# Patient Record
Sex: Female | Born: 1965 | Race: White | Hispanic: No | Smoking: Never smoker
Health system: Southern US, Community
[De-identification: ages and names within clinical notes are randomized; demographics above are authoritative.]

## PROBLEM LIST (undated history)

## (undated) DIAGNOSIS — K760 Fatty (change of) liver, not elsewhere classified: Secondary | ICD-10-CM

## (undated) DIAGNOSIS — I10 Essential (primary) hypertension: Secondary | ICD-10-CM

## (undated) DIAGNOSIS — R7303 Prediabetes: Secondary | ICD-10-CM

## (undated) DIAGNOSIS — M1990 Unspecified osteoarthritis, unspecified site: Secondary | ICD-10-CM

## (undated) DIAGNOSIS — Z923 Personal history of irradiation: Secondary | ICD-10-CM

## (undated) DIAGNOSIS — H269 Unspecified cataract: Secondary | ICD-10-CM

## (undated) DIAGNOSIS — N6320 Unspecified lump in the left breast, unspecified quadrant: Secondary | ICD-10-CM

## (undated) DIAGNOSIS — N8502 Endometrial intraepithelial neoplasia [EIN]: Secondary | ICD-10-CM

## (undated) DIAGNOSIS — F419 Anxiety disorder, unspecified: Secondary | ICD-10-CM

## (undated) HISTORY — DX: Unspecified cataract: H26.9

## (undated) HISTORY — DX: Fatty (change of) liver, not elsewhere classified: K76.0

## (undated) HISTORY — PX: CHOLECYSTECTOMY: SHX55

## (undated) HISTORY — DX: Anxiety disorder, unspecified: F41.9

## (undated) HISTORY — DX: Essential (primary) hypertension: I10

## (undated) HISTORY — PX: ABDOMINAL HYSTERECTOMY: SHX81

## (undated) HISTORY — PX: BREAST SURGERY: SHX581

## (undated) HISTORY — PX: WISDOM TOOTH EXTRACTION: SHX21

## (undated) HISTORY — PX: APPENDECTOMY: SHX54

## (undated) HISTORY — PX: BREAST EXCISIONAL BIOPSY: SUR124

---

## 2014-09-13 ENCOUNTER — Emergency Department (HOSPITAL_COMMUNITY)
Admission: EM | Admit: 2014-09-13 | Discharge: 2014-09-13 | Disposition: A | Discharge status: Home / Self Care | Payer: PRIVATE HEALTH INSURANCE | Attending: Emergency Medicine | Admitting: Emergency Medicine

## 2014-09-13 ENCOUNTER — Emergency Department (HOSPITAL_COMMUNITY): Payer: PRIVATE HEALTH INSURANCE

## 2014-09-13 ENCOUNTER — Encounter (HOSPITAL_COMMUNITY): Payer: Self-pay | Admitting: Emergency Medicine

## 2014-09-13 DIAGNOSIS — R062 Wheezing: Secondary | ICD-10-CM

## 2014-09-13 DIAGNOSIS — R918 Other nonspecific abnormal finding of lung field: Secondary | ICD-10-CM | POA: Diagnosis not present

## 2014-09-13 DIAGNOSIS — R911 Solitary pulmonary nodule: Secondary | ICD-10-CM | POA: Insufficient documentation

## 2014-09-13 DIAGNOSIS — J050 Acute obstructive laryngitis [croup]: Secondary | ICD-10-CM | POA: Insufficient documentation

## 2014-09-13 DIAGNOSIS — R0602 Shortness of breath: Secondary | ICD-10-CM | POA: Diagnosis present

## 2014-09-13 HISTORY — DX: Morbid (severe) obesity due to excess calories: E66.01

## 2014-09-13 LAB — BASIC METABOLIC PANEL
Anion gap: 12 (ref 5–15)
BUN: 6 mg/dL (ref 6–23)
CALCIUM: 8.3 mg/dL — AB (ref 8.4–10.5)
CO2: 25 mmol/L (ref 19–32)
CREATININE: 0.85 mg/dL (ref 0.50–1.10)
Chloride: 100 mmol/L (ref 96–112)
GFR calc Af Amer: >90 mL/min (ref >90)
GFR calc non Af Amer: 81 mL/min — ABNORMAL LOW (ref >90)
Glucose, Bld: 126 mg/dL — ABNORMAL HIGH (ref 70–99)
Potassium: 3.9 mmol/L (ref 3.5–5.1)
Sodium: 137 mmol/L (ref 135–145)

## 2014-09-13 LAB — CBC WITH DIFFERENTIAL/PLATELET
BASOS ABS: 0 10*3/uL (ref 0.0–0.1)
Basophils Relative: 0 % (ref 0–1)
Eosinophils Absolute: 0.1 10*3/uL (ref 0.0–0.7)
Eosinophils Relative: 1 % (ref 0–5)
HCT: 40.4 % (ref 36.0–46.0)
Hemoglobin: 13.2 g/dL (ref 12.0–15.0)
LYMPHS ABS: 1.3 10*3/uL (ref 0.7–4.0)
Lymphocytes Relative: 13 % (ref 12–46)
MCH: 28 pg (ref 26.0–34.0)
MCHC: 32.7 g/dL (ref 30.0–36.0)
MCV: 85.8 fL (ref 78.0–100.0)
MONO ABS: 0.7 10*3/uL (ref 0.1–1.0)
Monocytes Relative: 7 % (ref 3–12)
Neutro Abs: 8.2 10*3/uL — ABNORMAL HIGH (ref 1.7–7.7)
Neutrophils Relative %: 79 % — ABNORMAL HIGH (ref 43–77)
Platelets: 236 10*3/uL (ref 150–400)
RBC: 4.71 MIL/uL (ref 3.87–5.11)
RDW: 14.3 % (ref 11.5–15.5)
WBC: 10.2 10*3/uL (ref 4.0–10.5)

## 2014-09-13 LAB — RAPID STREP SCREEN (MED CTR MEBANE ONLY): Streptococcus, Group A Screen (Direct): NEGATIVE

## 2014-09-13 MED ORDER — HYDROCODONE-ACETAMINOPHEN 7.5-325 MG/15ML PO SOLN
10.0000 mL | Freq: Once | ORAL | Status: AC
  Administered 2014-09-13: 10 mL via ORAL
  Filled 2014-09-13: qty 15

## 2014-09-13 MED ORDER — PREDNISONE 10 MG PO TABS: 20.0000 mg | ORAL_TABLET | Freq: Every day | ORAL | Status: DC

## 2014-09-13 MED ORDER — IPRATROPIUM BROMIDE 0.02 % IN SOLN
0.5000 mg | Freq: Once | RESPIRATORY_TRACT | Status: AC
  Administered 2014-09-13: 0.5 mg via RESPIRATORY_TRACT
  Filled 2014-09-13: qty 2.5

## 2014-09-13 MED ORDER — RACEPINEPHRINE HCL 2.25 % IN NEBU
0.5000 mL | INHALATION_SOLUTION | Freq: Once | RESPIRATORY_TRACT | Status: AC
  Administered 2014-09-13: 0.5 mL via RESPIRATORY_TRACT

## 2014-09-13 MED ORDER — HYDROCODONE-ACETAMINOPHEN 7.5-325 MG/15ML PO SOLN: 10.0000 mL | Freq: Four times a day (QID) | ORAL | Status: DC | PRN

## 2014-09-13 MED ORDER — ALBUTEROL SULFATE (2.5 MG/3ML) 0.083% IN NEBU
5.0000 mg | INHALATION_SOLUTION | Freq: Once | RESPIRATORY_TRACT | Status: AC
  Administered 2014-09-13: 5 mg via RESPIRATORY_TRACT
  Filled 2014-09-13: qty 6

## 2014-09-13 MED ORDER — LORAZEPAM 2 MG/ML IJ SOLN: 0.5000 mg | Freq: Once | INTRAMUSCULAR | Status: DC

## 2014-09-13 MED ORDER — SODIUM CHLORIDE 0.9 % IN NEBU
9.0000 mL | INHALATION_SOLUTION | Freq: Once | RESPIRATORY_TRACT | Status: AC
  Administered 2014-09-13: 9 mL via RESPIRATORY_TRACT
  Filled 2014-09-13: qty 9

## 2014-09-13 MED ORDER — ERYTHROMYCIN BASE 250 MG PO TBEC
500.0000 mg | DELAYED_RELEASE_TABLET | Freq: Once | ORAL | Status: AC
  Administered 2014-09-13: 500 mg via ORAL
  Filled 2014-09-13: qty 2

## 2014-09-13 MED ORDER — PREDNISONE 20 MG PO TABS
60.0000 mg | ORAL_TABLET | Freq: Once | ORAL | Status: AC
  Administered 2014-09-13: 60 mg via ORAL
  Filled 2014-09-13: qty 3

## 2014-09-13 MED ORDER — AZITHROMYCIN 250 MG PO TABS: 250.0000 mg | ORAL_TABLET | Freq: Every day | ORAL | Status: DC

## 2014-09-13 MED ORDER — RACEPINEPHRINE HCL 2.25 % IN NEBU
0.5000 mL | INHALATION_SOLUTION | Freq: Once | RESPIRATORY_TRACT | Status: DC
  Filled 2014-09-13: qty 0.5

## 2014-09-13 NOTE — ED Provider Notes (Signed)
Patient presented to the ER with cough, sore throat, shortness of breath. Symptoms ongoing for approximately 1 week.  Face to face Exam: HEENT - PERRLA Neck - noisy breathing with upper airway resonance, no stridor Lungs - CTAB, but significant upper airway resonance present Heart - RRR, no M/R/G Abd - S/NT/ND Neuro - alert, oriented x3  Plan: Patient presents with flulike symptoms and upper respiratory infection symptoms for one week. She does have auditory respirations are consistent with significant upper airway residence. She does, however, have a harsh barky cough as well. Soft tissue neck x-ray and examination do not support epiglottitis. Symptoms most likely viral but cannot rule out pertussis. Will need treatment with steroids for upper airway inflammation and empiric coverage for pertussis.  Orpah Greek, MD 09/13/14 [phone]

## 2014-09-13 NOTE — ED Provider Notes (Signed)
CSN: [id number]     Arrival date & time 09/13/14  1056 History   First MD Initiated Contact with Patient 09/13/14 1059     Chief Complaint  Patient presents with  . Shortness of Breath     (Consider location/radiation/quality/duration/timing/severity/associated sxs/prior Treatment) HPI    PCP: No PCP Per Patient Blood pressure 157/72, pulse 86, temperature 99.5 F (37.5 C), temperature source Oral, resp. rate 13, height 5\' 3"  (1.6 m), weight 318 lb 7 oz (144.442 kg), last menstrual period 08/23/2014, SpO2 97 %.  Victoria Hunt is a 49 y.o.female with a significant PMH of cholecystectomy and obesity Pt presents to the ED with complaints of flu-like symptoms of cough, congestion, sore throat, diarrhea (resolved), muscle aches. The patient states that the symptoms started 1 weeks ago. Last night she developed wheezing and shortness of breath.   She has tried cough medicine, NSAIDS, and rest but has only felt mild relief.  Denies hx of wheezing or COPD. She is a non smoker but does live with a smoker.  The patient denies headaches, neck pain, weakness, vision changes, severe abdominal pain, inability to eat or drink, , ear pain, headaches, abdominal pain, vomiting, chest pain.   Past Medical History  Diagnosis Date  . Morbidly obese    Past Surgical History  Procedure Laterality Date  . Cholecystectomy     History reviewed. No pertinent family history. History  Substance Use Topics  . Smoking status: Passive Smoke Exposure - Never Smoker  . Smokeless tobacco: Not on file  . Alcohol Use: Not on file   OB History    No data available     Review of Systems  10 Systems reviewed and are negative for acute change except as noted in the HPI.   Allergies  Review of patient's allergies indicates no known allergies.  Home Medications   Prior to Admission medications   Medication Sig Start Date End Date Taking? Authorizing Provider  azithromycin (ZITHROMAX) 250 MG tablet Take 1  tablet (250 mg total) by mouth daily. Take first 2 tablets together, then 1 every day until finished. 09/13/14   Asmar Brozek Carlota Raspberry, PA-C  HYDROcodone-acetaminophen (HYCET) 7.5-325 mg/15 ml solution Take 10 mLs by mouth 4 (four) times daily as needed for moderate pain. 09/13/14   Shakenya Stoneberg Carlota Raspberry, PA-C  ibuprofen (ADVIL,MOTRIN) 400 MG tablet Take 400 mg by mouth every 6 (six) hours as needed.   Yes Historical Provider, MD  predniSONE (DELTASONE) 10 MG tablet Take 2 tablets (20 mg total) by mouth daily. 09/13/14   Avril Busser Carlota Raspberry, PA-C   BP 149/79 mmHg  Pulse 106  Temp(Src) 99.5 F (37.5 C) (Oral)  Resp 21  Ht 5\' 3"  (1.6 m)  Wt 318 lb 7 oz (144.442 kg)  BMI 56.42 kg/m2  SpO2 93%  LMP 08/23/2014 Physical Exam  Constitutional: She appears well-developed and well-nourished. No distress.  HENT:  Head: Normocephalic and atraumatic.  Mouth/Throat: Uvula is midline. No trismus in the jaw. Posterior oropharyngeal erythema present. No oropharyngeal exudate, posterior oropharyngeal edema or tonsillar abscesses.  Pt is able to swallow, no lateralization of sore throat. Soft tissue of the neck is soft. No stridor.  Eyes: Pupils are equal, round, and reactive to light.  Neck: Normal range of motion. Neck supple.  Cardiovascular: Normal rate and regular rhythm.   Pulmonary/Chest: Effort normal. No accessory muscle usage. No respiratory distress. She has decreased breath sounds (bilateral lower lung fields with decreased breath sounds). She has wheezes (worse to the upper bilateral lobes).  She has no rhonchi. She has no rales.  Abdominal: Soft.  Neurological: She is alert.  Skin: Skin is warm and dry.  Nursing note and vitals reviewed.   ED Course  Procedures (including critical care time) Labs Review Labs Reviewed  CBC WITH DIFFERENTIAL/PLATELET - Abnormal; Notable for the following:    Neutrophils Relative % 79 (*)    Neutro Abs 8.2 (*)    All other components within normal limits  BASIC METABOLIC  PANEL - Abnormal; Notable for the following:    Glucose, Bld 126 (*)    Calcium 8.3 (*)    GFR calc non Af Amer 81 (*)    All other components within normal limits  RAPID STREP SCREEN  CULTURE, GROUP A STREP    Imaging Review Dg Neck Soft Tissue  09/13/2014   CLINICAL DATA:  Shortness of breath, flu-like symptoms. Productive cough.  EXAM: NECK SOFT TISSUES - 1+ VIEW  COMPARISON:  None.  FINDINGS: There is no evidence of retropharyngeal soft tissue swelling or epiglottic enlargement. The cervical airway is unremarkable and no radio-opaque foreign body identified.  IMPRESSION: Negative.   Electronically Signed   By: Conchita Paris M.D.   On: 09/13/2014 12:59   Dg Chest 2 View  09/13/2014   CLINICAL DATA:  Flu-like symptoms. Productive cough. Fever. Symptoms for 3 days.  EXAM: CHEST  2 VIEW  COMPARISON:  None.  FINDINGS: Upper normal heart size. Bibasilar atelectasis. Nodular density projects over the right anterior fifth rib. Low volumes. Normal vascularity. No pleural effusion or pneumothorax.  IMPRESSION: Possible right mid lung nodule.  CT is recommended.  Bibasilar atelectasis.   Electronically Signed   By: Marybelle Killings M.D.   On: 09/13/2014 13:12     EKG Interpretation   Date/Time:  Saturday September 13 2014 11:04:36 EDT Ventricular Rate:  82 PR Interval:  130 QRS Duration: 92 QT Interval:  356 QTC Calculation: 416 R Axis:   96 Text Interpretation:  Sinus rhythm Borderline right axis deviation Low  voltage, precordial leads Otherwise within normal limits Confirmed by  POLLINA  MD, CHRISTOPHER ([id number]) on 09/13/2014 11:11:05 AM      MDM   Final diagnoses:  Wheezing  Abnormal x-ray of lungs with single pulmonary nodule  Croup    The patient was seen by Dr. Betsey Holiday as well. She has a barky cough and significant upper airway resonance. The soft tissue xray of the neck r/o epiglottitis. Her pain easily resolved with the does not Hycet, very low suspicion for retropharyngeal  abscess.   Medications  erythromycin (ERY-TAB) EC tablet 500 mg (not administered)  albuterol (PROVENTIL) (2.5 MG/3ML) 0.083% nebulizer solution 5 mg (5 mg Nebulization Given 09/13/14 1142)  ipratropium (ATROVENT) nebulizer solution 0.5 mg (0.5 mg Nebulization Given 09/13/14 1142)  predniSONE (DELTASONE) tablet 60 mg (60 mg Oral Given 09/13/14 1142)  HYDROcodone-acetaminophen (HYCET) 7.5-325 mg/15 ml solution 10 mL (10 mLs Oral Given 09/13/14 1246)  albuterol (PROVENTIL) (2.5 MG/3ML) 0.083% nebulizer solution 5 mg (5 mg Nebulization Given 09/13/14 1317)  ipratropium (ATROVENT) nebulizer solution 0.5 mg (0.5 mg Nebulization Given 09/13/14 1317)  sodium chloride 0.9 % nebulizer solution 9 mL (9 mLs Nebulization Given 09/13/14 1414)  Racepinephrine HCl 2.25 % nebulizer solution 0.5 mL (0.5 mLs Nebulization Given 09/13/14 1414)   Her chest xray showed a possibly very small lung nodule. I will recommend outpatient CT scan for further evaluation. Treatment will consistent of steroids to cover for inflammation of the upper airways and Azithromycin for coverage  of possible Pertussis. Hycet for sore throat and cough.  Patient significantly improved after treatments and feels comfortable with plan.  49 y.o.Kaleen Odea evaluation in the Emergency Department is complete. It has been determined that no acute conditions requiring further emergency intervention are present at this time. The patient/guardian have been advised of the diagnosis and plan. We have discussed signs and symptoms that warrant return to the ED, such as changes or worsening in symptoms.  Vital signs are stable at discharge. Filed Vitals:   09/13/14 1200  BP: 149/79  Pulse: 106  Temp:   Resp: 21    Patient/guardian has voiced understanding and agreed to follow-up with the PCP or specialist.     Delos Haring, PA-C 09/13/14 [place], MD 09/13/14 [phone]

## 2014-09-13 NOTE — ED Notes (Addendum)
Pt c/o sob since last night. Pt sts she had flu like symptoms at the beginning of the week. Three episodes of diarrhea, nausea two days ago. Pt sts she no longer has diarrhea. Pt has had productive cough brown in color for a week. Pt c/o sore throat 10/10.  Pt note to have exp wheezes bilateral pt had no hx of asthma or any other respiratory issues. O2 96% rm air. RR 22. B/P 151/92 HR 87

## 2014-09-13 NOTE — Discharge Instructions (Signed)
Cool Mist Vaporizers Vaporizers may help relieve the symptoms of a cough and cold. They add moisture to the air, which helps mucus to become thinner and less sticky. This makes it easier to breathe and cough up secretions. Cool mist vaporizers do not cause serious burns like hot mist vaporizers, which may also be called steamers or humidifiers. Vaporizers have not been proven to help with colds. You should not use a vaporizer if you are allergic to mold. HOME CARE INSTRUCTIONS  Follow the package instructions for the vaporizer.  Do not use anything other than distilled water in the vaporizer.  Do not run the vaporizer all of the time. This can cause mold or bacteria to grow in the vaporizer.  Clean the vaporizer after each time it is used.  Clean and dry the vaporizer well before storing it.  Stop using the vaporizer if worsening respiratory symptoms develop. Document Released: 02/04/2004 Document Revised: 05/14/2013 Document Reviewed: 09/26/2012 [facility] Patient Information 2015 [place], [place]. This information is not intended to replace advice given to you by your health care provider. Make sure you discuss any questions you have with your health care provider.   Pulmonary Nodule A pulmonary nodule is a small, round growth of tissue in the lung. Pulmonary nodules can range in size from less than 1/5 inch (4 mm) to a little bigger than an inch (25 mm). Most pulmonary nodules are detected when imaging tests of the lung are being performed for a different problem. Pulmonary nodules are usually not cancerous (benign). However, some pulmonary nodules are cancerous (malignant). Follow-up treatment or testing is based on the size of the pulmonary nodule and your risk of getting lung cancer.  CAUSES Benign pulmonary nodules can be caused by various things. Some of the causes include:   Bacterial, fungal, or viral infections. This is usually an old infection that is no longer active, but it can  sometimes be a current, active infection.  A benign mass of tissue.  Inflammation from conditions such as rheumatoid arthritis.   Abnormal blood vessels in the lungs. Malignant pulmonary nodules can result from lung cancer or from cancers that spread to the lung from other places in the body. SIGNS AND SYMPTOMS Pulmonary nodules usually do not cause symptoms. DIAGNOSIS Most often, pulmonary nodules are found incidentally when an X-ray or CT scan is performed to look for some other problem in the lung area. To help determine whether a pulmonary nodule is benign or malignant, your health care provider will take a medical history and order a variety of tests. Tests done may include:   Blood tests.  A skin test called a tuberculin test. This test is used to determine if you have been exposed to the germ that causes tuberculosis.   Chest X-rays. If possible, a new X-ray may be compared with X-rays you have had in the past.   CT scan. This test shows smaller pulmonary nodules more clearly than an X-ray.   Positron emission tomography (PET) scan. In this test, a safe amount of a radioactive substance is injected into the bloodstream. Then, the scan takes a picture of the pulmonary nodule. The radioactive substance is eliminated from your body in your urine.   Biopsy. A tiny piece of the pulmonary nodule is removed so it can be checked under a microscope. TREATMENT  Pulmonary nodules that are benign normally do not require any treatment because they usually do not cause symptoms or breathing problems. Your health care provider may want to  monitor the pulmonary nodule through follow-up CT scans. The frequency of these CT scans will vary based on the size of the nodule and the risk factors for lung cancer. For example, CT scans will need to be done more frequently if the pulmonary nodule is larger and if you have a history of smoking and a family history of cancer. Further testing or biopsies  may be done if any follow-up CT scan shows that the size of the pulmonary nodule has increased. HOME CARE INSTRUCTIONS  Only take over-the-counter or prescription medicines as directed by your health care provider.  Keep all follow-up appointments with your health care provider. SEEK MEDICAL CARE IF:  You have trouble breathing when you are active.   You feel sick or unusually tired.   You do not feel like eating.   You lose weight without trying to.   You develop chills or night sweats.  SEEK IMMEDIATE MEDICAL CARE IF:  You cannot catch your breath, or you begin wheezing.   You cannot stop coughing.   You cough up blood.   You become dizzy or feel like you are going to pass out.   You have sudden chest pain.   You have a fever or persistent symptoms for more than 2-3 days.   You have a fever and your symptoms suddenly get worse. MAKE SURE YOU:  Understand these instructions.  Will watch your condition.  Will get help right away if you are not doing well or get worse. Document Released: 03/06/2009 Document Revised: 01/09/2013 Document Reviewed: 10/29/2012 [facility] Patient Information 2015 [place], [place]. This information is not intended to replace advice given to you by your health care provider. Make sure you discuss any questions you have with your health care provider. Pertussis Pertussis (whooping cough) is an infection that causes severe and sudden coughing attacks. CAUSES  Pertussis is caused by bacteria. It is very contagious and spreads to others by the droplets sprayed in the air when an infected person talks, coughs, and sneezes. You may have caught pertussis from inhaling these droplets or from touching a surface where the droplets fell and then touching your mouth or nose. SIGNS AND SYMPTOMS  Early during this infection, symptoms of pertussis are similar to those of the common cold. They include a runny nose, low fever, mild cough, and red, watery  eyes. After 1-2 weeks the cold symptoms get better, but the cough worsens and severe and sudden coughing attacks frequently develop. During these attacks people may cough so hard that vomiting occurs. Over the next month to 6 weeks, the cough starts to get better, but it may take as long as 6 months for the cough to go away completely. DIAGNOSIS  Your health care provider will perform a physical exam. The health care provider may take a mucus sample from the nose and throat and a blood sample to help confirm the diagnosis. The health care provider may also take a chest X-ray. TREATMENT  Antibiotic medicines are usually prescribed for this infection. Starting antibiotics quickly may help shorten the illness and make it less contagious. Antibiotics may also be prescribed for everyone living in the same household. Immunization may be recommended for those in the household at risk of developing pertussis. At-risk groups include:  Infants.  Those who have not had their full course of pertussis immunizations.  Those who were immunized but have not had their recent booster shot. Mild coughing may continue for months after the infection is treated from the  remaining soreness and inflammation in the lungs. HOME CARE INSTRUCTIONS   If you were prescribed an antibiotic medicine, finish it all even if you start to feel better.  Do not take cough medicine unless prescribed by your health care provider. Coughing is a protective mechanism that helps keep colored mucus (sputum) and secretions from clogging breathing passages.  Stay away from those who are at risk of developing pertussis for the first 5 days of antibiotic treatment. If no antibiotics are prescribed, stay at home for the first 3 weeks you are coughing.  Do not go to work until you have been treated with antibiotics for 5 days. If no antibiotics are prescribed, do not go to work for the first 3 weeks you are coughing. Inform your workplace that  you were diagnosed with pertussis.  Wash your hands often. Those living in the same household should also wash their hands often to avoid spreading the infection.  Avoid substances that may irritate the lungs, such as smoke, aerosols, or fumes. These substances may worsen your coughing.  If you are having a coughing attack:  Raise the head of your mattress to help clear sputum more easily and improve breathing.  Sit upright.  Use a cool mist humidifier at home to increase air moisture. This will soothe your cough and help loosen sputum. Do not use hot steam.  Rest as much as possible. Normal activity may be gradually resumed.  Drink enough fluids to keep your urine clear or pale yellow. PREVENTION  Pertussis can be prevented with a vaccine and later booster shots. The pertussis vaccine is usually given during childhood. Adults who were not previously vaccinated should be vaccinated as soon as possible. Adults who were previously vaccinated should talk to their health care providers about the need for a booster shot because immunity from the vaccine decreases over time. All of the following persons should consider receiving a booster dose of pertussis, which is combined with tetanus and diphtheria (Tdap) vaccine:  Pregnant women during each pregnancy, preferably at 27-36 weeks of pregnancy (gestation).  All persons who have or will have close contact with an infant aged less than 12 months. Infants are at highest risk for life-threatening complications from pertussis.  All health care personnel. SEEK MEDICAL CARE IF:   You have persistent vomiting.  You are not able to eat or drink fluids.  You do not seem to be improving.  You have a fever. SEEK IMMEDIATE MEDICAL CARE IF:   Your face turns red or blue during a coughing attack.  You become unconscious after a coughing attack, even if only for a few moments.  Your breathing stops for a period of time (apnea).  You are restless  or cannot sleep.  You are listless or sleeping too much. MAKE SURE YOU:  Understand these instructions.   Will watch your condition.   Will get help right away if you are not doing well or get worse. Document Released: 09/03/2012 Document Revised: 09/23/2013 Document Reviewed: 09/03/2012 [facility] Patient Information 2015 [place], [place]. This information is not intended to replace advice given to you by your health care provider. Make sure you discuss any questions you have with your health care provider.

## 2014-09-15 LAB — CULTURE, GROUP A STREP: Strep A Culture: NEGATIVE

## 2014-11-30 ENCOUNTER — Other Ambulatory Visit: Payer: Self-pay | Admitting: Pain Medicine

## 2020-08-25 ENCOUNTER — Inpatient Hospital Stay (HOSPITAL_COMMUNITY): Payer: BC Managed Care – PPO | Admitting: Registered Nurse

## 2020-08-25 ENCOUNTER — Encounter (HOSPITAL_COMMUNITY)
Admission: EM | Disposition: A | Discharge status: Home / Self Care | Payer: Self-pay | Source: Home / Self Care | Attending: Emergency Medicine

## 2020-08-25 ENCOUNTER — Emergency Department (HOSPITAL_COMMUNITY): Payer: BC Managed Care – PPO

## 2020-08-25 ENCOUNTER — Observation Stay (HOSPITAL_COMMUNITY)
Admission: EM | Admit: 2020-08-25 | Discharge: 2020-08-26 | Disposition: A | Discharge status: Home / Self Care | Payer: BC Managed Care – PPO | Attending: Surgery | Admitting: Surgery

## 2020-08-25 DIAGNOSIS — K3580 Unspecified acute appendicitis: Secondary | ICD-10-CM | POA: Diagnosis not present

## 2020-08-25 DIAGNOSIS — R1031 Right lower quadrant pain: Secondary | ICD-10-CM | POA: Diagnosis not present

## 2020-08-25 DIAGNOSIS — R000 Tachycardia, unspecified: Secondary | ICD-10-CM | POA: Diagnosis not present

## 2020-08-25 DIAGNOSIS — K353 Acute appendicitis with localized peritonitis, without perforation or gangrene: Principal | ICD-10-CM | POA: Insufficient documentation

## 2020-08-25 DIAGNOSIS — Z20822 Contact with and (suspected) exposure to covid-19: Secondary | ICD-10-CM | POA: Insufficient documentation

## 2020-08-25 DIAGNOSIS — Z7722 Contact with and (suspected) exposure to environmental tobacco smoke (acute) (chronic): Secondary | ICD-10-CM | POA: Diagnosis not present

## 2020-08-25 DIAGNOSIS — K37 Unspecified appendicitis: Secondary | ICD-10-CM | POA: Diagnosis not present

## 2020-08-25 HISTORY — PX: LAPAROSCOPIC APPENDECTOMY: SHX408

## 2020-08-25 LAB — COMPREHENSIVE METABOLIC PANEL
ALT: 23 U/L (ref 0–44)
AST: 17 U/L (ref 15–41)
Albumin: 3.9 g/dL (ref 3.5–5.0)
Alkaline Phosphatase: 89 U/L (ref 38–126)
Anion gap: 9 (ref 5–15)
BUN: 10 mg/dL (ref 6–20)
CO2: 25 mmol/L (ref 22–32)
Calcium: 9.1 mg/dL (ref 8.9–10.3)
Chloride: 100 mmol/L (ref 98–111)
Creatinine, Ser: 0.85 mg/dL (ref 0.44–1.00)
GFR, Estimated: >60 mL/min (ref >60)
Glucose, Bld: 139 mg/dL — ABNORMAL HIGH (ref 70–99)
Potassium: 4 mmol/L (ref 3.5–5.1)
Sodium: 134 mmol/L — ABNORMAL LOW (ref 135–145)
Total Bilirubin: 0.3 mg/dL (ref 0.3–1.2)
Total Protein: 7.6 g/dL (ref 6.5–8.1)

## 2020-08-25 LAB — CBC WITH DIFFERENTIAL/PLATELET
Abs Immature Granulocytes: 0.08 10*3/uL — ABNORMAL HIGH (ref 0.00–0.07)
Basophils Absolute: 0.1 10*3/uL (ref 0.0–0.1)
Basophils Relative: 1 %
Eosinophils Absolute: 0 10*3/uL (ref 0.0–0.5)
Eosinophils Relative: 0 %
HCT: 42.7 % (ref 36.0–46.0)
Hemoglobin: 13.9 g/dL (ref 12.0–15.0)
Immature Granulocytes: 1 %
Lymphocytes Relative: 11 %
Lymphs Abs: 1.8 10*3/uL (ref 0.7–4.0)
MCH: 28.8 pg (ref 26.0–34.0)
MCHC: 32.6 g/dL (ref 30.0–36.0)
MCV: 88.4 fL (ref 80.0–100.0)
Monocytes Absolute: 0.8 10*3/uL (ref 0.1–1.0)
Monocytes Relative: 5 %
Neutro Abs: 13.9 10*3/uL — ABNORMAL HIGH (ref 1.7–7.7)
Neutrophils Relative %: 82 %
Platelets: 370 10*3/uL (ref 150–400)
RBC: 4.83 MIL/uL (ref 3.87–5.11)
RDW: 14.2 % (ref 11.5–15.5)
WBC: 16.7 10*3/uL — ABNORMAL HIGH (ref 4.0–10.5)
nRBC: 0 % (ref 0.0–0.2)

## 2020-08-25 LAB — RESP PANEL BY RT-PCR (FLU A&B, COVID) ARPGX2
Influenza A by PCR: NEGATIVE
Influenza B by PCR: NEGATIVE
SARS Coronavirus 2 by RT PCR: NEGATIVE

## 2020-08-25 LAB — LIPASE, BLOOD: Lipase: 26 U/L (ref 11–51)

## 2020-08-25 LAB — I-STAT BETA HCG BLOOD, ED (MC, WL, AP ONLY): I-stat hCG, quantitative: <5 m[IU]/mL (ref <5)

## 2020-08-25 SURGERY — APPENDECTOMY, LAPAROSCOPIC
Anesthesia: General | Site: Abdomen

## 2020-08-25 MED ORDER — ACETAMINOPHEN 500 MG PO TABS: 1000.0000 mg | ORAL_TABLET | Freq: Once | ORAL | Status: DC | PRN

## 2020-08-25 MED ORDER — ONDANSETRON HCL 4 MG/2ML IJ SOLN: 4.0000 mg | Freq: Four times a day (QID) | INTRAMUSCULAR | Status: DC | PRN

## 2020-08-25 MED ORDER — BUPIVACAINE HCL 0.25 % IJ SOLN
INTRAMUSCULAR | Status: DC | PRN
  Administered 2020-08-25: 10 mL

## 2020-08-25 MED ORDER — FENTANYL CITRATE (PF) 100 MCG/2ML IJ SOLN: 25.0000 ug | INTRAMUSCULAR | Status: DC | PRN

## 2020-08-25 MED ORDER — KETOROLAC TROMETHAMINE 30 MG/ML IJ SOLN
30.0000 mg | Freq: Once | INTRAMUSCULAR | Status: AC
  Administered 2020-08-25: 30 mg via INTRAVENOUS
  Filled 2020-08-25: qty 1

## 2020-08-25 MED ORDER — IOHEXOL 300 MG/ML  SOLN
100.0000 mL | Freq: Once | INTRAMUSCULAR | Status: AC | PRN
  Administered 2020-08-25: 100 mL via INTRAVENOUS

## 2020-08-25 MED ORDER — LACTATED RINGERS IV SOLN: INTRAVENOUS | Status: DC | PRN

## 2020-08-25 MED ORDER — PROPOFOL 10 MG/ML IV BOLUS
INTRAVENOUS | Status: DC | PRN
  Administered 2020-08-25: 200 mg via INTRAVENOUS

## 2020-08-25 MED ORDER — OXYCODONE HCL 5 MG/5ML PO SOLN: 5.0000 mg | Freq: Once | ORAL | Status: DC | PRN

## 2020-08-25 MED ORDER — LIDOCAINE 2% (20 MG/ML) 5 ML SYRINGE
INTRAMUSCULAR | Status: DC | PRN
  Administered 2020-08-25: 80 mg via INTRAVENOUS

## 2020-08-25 MED ORDER — PHENYLEPHRINE 40 MCG/ML (10ML) SYRINGE FOR IV PUSH (FOR BLOOD PRESSURE SUPPORT)
PREFILLED_SYRINGE | INTRAVENOUS | Status: DC | PRN
  Administered 2020-08-25 (×2): 80 ug via INTRAVENOUS

## 2020-08-25 MED ORDER — DEXAMETHASONE SODIUM PHOSPHATE 10 MG/ML IJ SOLN
INTRAMUSCULAR | Status: DC | PRN
  Administered 2020-08-25: 10 mg via INTRAVENOUS

## 2020-08-25 MED ORDER — ACETAMINOPHEN 160 MG/5ML PO SOLN: 1000.0000 mg | Freq: Once | ORAL | Status: DC | PRN

## 2020-08-25 MED ORDER — MIDAZOLAM HCL 2 MG/2ML IJ SOLN
INTRAMUSCULAR | Status: AC
  Filled 2020-08-25: qty 2

## 2020-08-25 MED ORDER — ONDANSETRON HCL 4 MG/2ML IJ SOLN
4.0000 mg | Freq: Once | INTRAMUSCULAR | Status: AC
  Administered 2020-08-25: 4 mg via INTRAVENOUS
  Filled 2020-08-25: qty 2

## 2020-08-25 MED ORDER — FENTANYL CITRATE (PF) 250 MCG/5ML IJ SOLN
INTRAMUSCULAR | Status: AC
  Filled 2020-08-25: qty 5

## 2020-08-25 MED ORDER — KETOROLAC TROMETHAMINE 30 MG/ML IJ SOLN
30.0000 mg | Freq: Four times a day (QID) | INTRAMUSCULAR | Status: DC | PRN
Start: 2020-08-25 — End: 2020-08-26

## 2020-08-25 MED ORDER — BUPIVACAINE HCL (PF) 0.25 % IJ SOLN
INTRAMUSCULAR | Status: AC
  Filled 2020-08-25: qty 30

## 2020-08-25 MED ORDER — SODIUM CHLORIDE 0.9 % IV SOLN
2.0000 g | Freq: Once | INTRAVENOUS | Status: AC
  Administered 2020-08-25: 2 g via INTRAVENOUS
  Filled 2020-08-25: qty 20

## 2020-08-25 MED ORDER — SODIUM CHLORIDE 0.9 % IR SOLN
Status: DC | PRN
  Administered 2020-08-25: 1000 mL

## 2020-08-25 MED ORDER — SUGAMMADEX SODIUM 200 MG/2ML IV SOLN
INTRAVENOUS | Status: DC | PRN
  Administered 2020-08-25 (×3): 100 mg via INTRAVENOUS

## 2020-08-25 MED ORDER — OXYCODONE HCL 5 MG PO TABS: 5.0000 mg | ORAL_TABLET | ORAL | Status: DC | PRN

## 2020-08-25 MED ORDER — OXYCODONE HCL 5 MG PO TABS: 5.0000 mg | ORAL_TABLET | Freq: Once | ORAL | Status: DC | PRN

## 2020-08-25 MED ORDER — PHENYLEPHRINE HCL-NACL 10-0.9 MG/250ML-% IV SOLN
INTRAVENOUS | Status: DC | PRN
  Administered 2020-08-25: 25 ug/min via INTRAVENOUS

## 2020-08-25 MED ORDER — DIPHENHYDRAMINE HCL 50 MG/ML IJ SOLN
INTRAMUSCULAR | Status: DC | PRN
  Administered 2020-08-25: 6.25 mg via INTRAVENOUS

## 2020-08-25 MED ORDER — SODIUM CHLORIDE 0.9 % IV BOLUS
1000.0000 mL | Freq: Once | INTRAVENOUS | Status: AC
  Administered 2020-08-25: 1000 mL via INTRAVENOUS

## 2020-08-25 MED ORDER — ONDANSETRON HCL 4 MG/2ML IJ SOLN
INTRAMUSCULAR | Status: DC | PRN
  Administered 2020-08-25: 4 mg via INTRAVENOUS

## 2020-08-25 MED ORDER — DEXTROSE-NACL 5-0.9 % IV SOLN: INTRAVENOUS | Status: DC

## 2020-08-25 MED ORDER — ONDANSETRON 4 MG PO TBDP: 4.0000 mg | ORAL_TABLET | Freq: Four times a day (QID) | ORAL | Status: DC | PRN

## 2020-08-25 MED ORDER — MIDAZOLAM HCL 5 MG/5ML IJ SOLN
INTRAMUSCULAR | Status: DC | PRN
  Administered 2020-08-25: 2 mg via INTRAVENOUS

## 2020-08-25 MED ORDER — METRONIDAZOLE IN NACL 5-0.79 MG/ML-% IV SOLN
500.0000 mg | Freq: Once | INTRAVENOUS | Status: AC
  Administered 2020-08-25: 500 mg via INTRAVENOUS
  Filled 2020-08-25: qty 100

## 2020-08-25 MED ORDER — SUCCINYLCHOLINE CHLORIDE 200 MG/10ML IV SOSY
PREFILLED_SYRINGE | INTRAVENOUS | Status: DC | PRN
  Administered 2020-08-25: 140 mg via INTRAVENOUS

## 2020-08-25 MED ORDER — FENTANYL CITRATE (PF) 250 MCG/5ML IJ SOLN
INTRAMUSCULAR | Status: DC | PRN
  Administered 2020-08-25: 50 ug via INTRAVENOUS
  Administered 2020-08-25: 100 ug via INTRAVENOUS

## 2020-08-25 MED ORDER — ROCURONIUM BROMIDE 10 MG/ML (PF) SYRINGE
PREFILLED_SYRINGE | INTRAVENOUS | Status: DC | PRN
  Administered 2020-08-25: 60 mg via INTRAVENOUS

## 2020-08-25 MED ORDER — 0.9 % SODIUM CHLORIDE (POUR BTL) OPTIME
TOPICAL | Status: DC | PRN
  Administered 2020-08-25: 1000 mL

## 2020-08-25 MED ORDER — ACETAMINOPHEN 10 MG/ML IV SOLN: 1000.0000 mg | Freq: Once | INTRAVENOUS | Status: DC | PRN

## 2020-08-25 SURGICAL SUPPLY — 41 items
APPLIER CLIP 5 13 M/L LIGAMAX5 (MISCELLANEOUS)
BLADE CLIPPER SURG (BLADE) IMPLANT
CANISTER SUCT 3000ML PPV (MISCELLANEOUS) ×2 IMPLANT
CHLORAPREP W/TINT 26 (MISCELLANEOUS) ×2 IMPLANT
CLIP APPLIE 5 13 M/L LIGAMAX5 (MISCELLANEOUS) IMPLANT
CLIP VESOLOCK XL 6/CT (CLIP) ×2 IMPLANT
COVER SURGICAL LIGHT HANDLE (MISCELLANEOUS) ×2 IMPLANT
COVER TRANSDUCER ULTRASND (DRAPES) ×2 IMPLANT
COVER WAND RF STERILE (DRAPES) ×2 IMPLANT
DERMABOND ADVANCED (GAUZE/BANDAGES/DRESSINGS) ×1
DERMABOND ADVANCED .7 DNX12 (GAUZE/BANDAGES/DRESSINGS) ×1 IMPLANT
ELECT REM PT RETURN 9FT ADLT (ELECTROSURGICAL) ×2
ELECTRODE REM PT RTRN 9FT ADLT (ELECTROSURGICAL) ×1 IMPLANT
ENDOLOOP SUT PDS II  0 18 (SUTURE)
ENDOLOOP SUT PDS II 0 18 (SUTURE) IMPLANT
GLOVE BIO SURGEON STRL SZ7.5 (GLOVE) ×2 IMPLANT
GOWN STRL REUS W/ TWL LRG LVL3 (GOWN DISPOSABLE) ×1 IMPLANT
GOWN STRL REUS W/ TWL XL LVL3 (GOWN DISPOSABLE) ×1 IMPLANT
GOWN STRL REUS W/TWL LRG LVL3 (GOWN DISPOSABLE) ×1
GOWN STRL REUS W/TWL XL LVL3 (GOWN DISPOSABLE) ×1
GRASPER SUT TROCAR 14GX15 (MISCELLANEOUS) ×2 IMPLANT
KIT BASIN OR (CUSTOM PROCEDURE TRAY) ×2 IMPLANT
KIT TURNOVER KIT B (KITS) ×2 IMPLANT
NEEDLE INSUFFLATION 14GA 120MM (NEEDLE) ×2 IMPLANT
NS IRRIG 1000ML POUR BTL (IV SOLUTION) ×2 IMPLANT
PAD ARMBOARD 7.5X6 YLW CONV (MISCELLANEOUS) ×4 IMPLANT
SCISSORS LAP 5X35 DISP (ENDOMECHANICALS) ×2 IMPLANT
SET IRRIG TUBING LAPAROSCOPIC (IRRIGATION / IRRIGATOR) ×2 IMPLANT
SET TUBE SMOKE EVAC HIGH FLOW (TUBING) ×2 IMPLANT
SLEEVE ENDOPATH XCEL 5M (ENDOMECHANICALS) ×2 IMPLANT
SPECIMEN JAR SMALL (MISCELLANEOUS) ×2 IMPLANT
SUT MNCRL AB 4-0 PS2 18 (SUTURE) ×2 IMPLANT
TOWEL GREEN STERILE (TOWEL DISPOSABLE) ×2 IMPLANT
TOWEL GREEN STERILE FF (TOWEL DISPOSABLE) ×2 IMPLANT
TRAY FOLEY W/BAG SLVR 16FR (SET/KITS/TRAYS/PACK)
TRAY FOLEY W/BAG SLVR 16FR ST (SET/KITS/TRAYS/PACK) IMPLANT
TRAY LAPAROSCOPIC MC (CUSTOM PROCEDURE TRAY) ×2 IMPLANT
TROCAR XCEL NON-BLD 11X100MML (ENDOMECHANICALS) ×2 IMPLANT
TROCAR XCEL NON-BLD 5MMX100MML (ENDOMECHANICALS) ×2 IMPLANT
WARMER LAPAROSCOPE (MISCELLANEOUS) ×2 IMPLANT
WATER STERILE IRR 1000ML POUR (IV SOLUTION) ×2 IMPLANT

## 2020-08-25 NOTE — Anesthesia Preprocedure Evaluation (Addendum)
Anesthesia Evaluation  Patient identified by MRN, date of birth, ID band Patient awake    Reviewed: Allergy & Precautions, NPO status , Patient's Chart, lab work & pertinent test results  History of Anesthesia Complications Negative for: history of anesthetic complications  Airway Mallampati: III  TM Distance: >3 FB Neck ROM: Full    Dental  (+) Dental Advisory Given, Teeth Intact   Pulmonary neg pulmonary ROS, neg shortness of breath, neg sleep apnea, neg COPD, neg recent URI,  Covid-19 Nucleic Acid Test Results Lab Results      Component                Value               Date                      SARSCOV2NAA              NEGATIVE            08/25/2020              breath sounds clear to auscultation       Cardiovascular negative cardio ROS   Rhythm:Regular     Neuro/Psych negative neurological ROS  negative psych ROS   GI/Hepatic Neg liver ROS, Appendicitis   Endo/Other  Morbid obesity  Renal/GU negative Renal ROSLab Results      Component                Value               Date                      CREATININE               0.85                08/25/2020           Lab Results      Component                Value               Date                      K                        4.0                 08/25/2020                Musculoskeletal   Abdominal   Peds  Hematology Lab Results      Component                Value               Date                      WBC                      16.7 (H)            08/25/2020                HGB                      13.9  08/25/2020                HCT                      42.7                08/25/2020                MCV                      88.4                08/25/2020                PLT                      370                 08/25/2020              Anesthesia Other Findings   Reproductive/Obstetrics                             Anesthesia Physical Anesthesia Plan  ASA: III  Anesthesia Plan: General   Post-op Pain Management:    Induction: Intravenous, Rapid sequence and Cricoid pressure planned  PONV Risk Score and Plan: 3 and Ondansetron and Dexamethasone  Airway Management Planned: Oral ETT  Additional Equipment: None  Intra-op Plan:   Post-operative Plan: Extubation in OR  Informed Consent: I have reviewed the patients History and Physical, chart, labs and discussed the procedure including the risks, benefits and alternatives for the proposed anesthesia with the patient or authorized representative who has indicated his/her understanding and acceptance.     Dental advisory given  Plan Discussed with: CRNA and Surgeon  Anesthesia Plan Comments:         Anesthesia Quick Evaluation

## 2020-08-25 NOTE — H&P (Signed)
Victoria Hunt is an 55 y.o. female.   Chief Complaint: Abdominal pain HPI: Patient is a 55 year old female, who has a history of morbid obesity, who comes in secondary to abdominal pain.  She states that this pain began yesterday prior to going to bed.  She states it was "stabbing abdominal pain "that was just generalized.  She states that overnight the pain continued to worsen.  She states that she woke up this morning with more significant pain to the right lower quadrant.  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  She states that she had some nausea however no emesis.  She did have one bout of diarrhea.  She states that the pain progressively moved down to the right lower quadrant area.  Patient presented to the ER secondary to continued abdominal pain.  CT scan did review a dilated appendix likely consistent with acute appendicitis.  I did review the CT scan personally.  Also patient with leukocytosis.  General surgery was consulted for evaluation and management.  Past Medical History:  Diagnosis Date  . Morbidly obese [facility])     Past Surgical History:  Procedure Laterality Date  . CHOLECYSTECTOMY      No family history on file. Social History:  reports that she is a non-smoker but has been exposed to tobacco smoke. She does not have any smokeless tobacco history on file. No history on file for alcohol use and drug use.  Allergies: No Known Allergies  (Not in a hospital admission)   Results for orders placed or performed during the hospital encounter of 08/25/20 (from the past 48 hour(s))  CBC with Differential     Status: Abnormal   Collection Time: 08/25/20  2:35 PM  Result Value Ref Range   WBC 16.7 (H) 4.0 - 10.5 K/uL   RBC 4.83 3.87 - 5.11 MIL/uL   Hemoglobin 13.9 12.0 - 15.0 g/dL   HCT 42.7 36.0 - 46.0 %   MCV 88.4 80.0 - 100.0 fL   MCH 28.8 26.0 - 34.0 pg   MCHC 32.6 30.0 - 36.0 g/dL   RDW 14.2 11.5 - 15.5 %   Platelets 370 150 - 400 K/uL   nRBC 0.0 0.0  - 0.2 %   Neutrophils Relative % 82 %   Neutro Abs 13.9 (H) 1.7 - 7.7 K/uL   Lymphocytes Relative 11 %   Lymphs Abs 1.8 0.7 - 4.0 K/uL   Monocytes Relative 5 %   Monocytes Absolute 0.8 0.1 - 1.0 K/uL   Eosinophils Relative 0 %   Eosinophils Absolute 0.0 0.0 - 0.5 K/uL   Basophils Relative 1 %   Basophils Absolute 0.1 0.0 - 0.1 K/uL   Immature Granulocytes 1 %   Abs Immature Granulocytes 0.08 (H) 0.00 - 0.07 K/uL    Comment: Performed at [facility], [address], [place], [place] [address]  Comprehensive metabolic panel     Status: Abnormal   Collection Time: 08/25/20  2:35 PM  Result Value Ref Range   Sodium 134 (L) 135 - 145 mmol/L   Potassium 4.0 3.5 - 5.1 mmol/L   Chloride 100 98 - 111 mmol/L   CO2 25 22 - 32 mmol/L   Glucose, Bld 139 (H) 70 - 99 mg/dL    Comment: Glucose reference range applies only to samples taken after fasting for at least 8 hours.   BUN 10 6 - 20 mg/dL   Creatinine, Ser 0.85 0.44 - 1.00 mg/dL  Calcium 9.1 8.9 - 10.3 mg/dL   Total Protein 7.6 6.5 - 8.1 g/dL   Albumin 3.9 3.5 - 5.0 g/dL   AST 17 15 - 41 U/L   ALT 23 0 - 44 U/L   Alkaline Phosphatase 89 38 - 126 U/L   Total Bilirubin 0.3 0.3 - 1.2 mg/dL   GFR, Estimated >60 >60 mL/min    Comment: (NOTE) Calculated using the CKD-EPI Creatinine Equation (2021)    Anion gap 9 5 - 15    Comment: Performed at [facility] [address], [place], [place] [address]  Lipase, blood     Status: None   Collection Time: 08/25/20  2:35 PM  Result Value Ref Range   Lipase 26 11 - 51 U/L    Comment: Performed at [facility], [place] [address], [place], [place] [address]  I-Stat Beta hCG blood, ED ([facility], [facility], [facility] only)     Status: None   Collection Time: 08/25/20  2:50 PM  Result Value Ref Range   I-stat hCG, quantitative <5.0 <5 mIU/mL   Comment 3            Comment:   GEST. AGE      CONC.  (mIU/mL)   <=1 WEEK        5 - 50     2 WEEKS       50 - 500     3 WEEKS       100 - 10,000     4  WEEKS     1,000 - 30,000        FEMALE AND NON-PREGNANT FEMALE:     LESS THAN 5 mIU/mL   Resp Panel by RT-PCR (Flu A&B, Covid) Nasopharyngeal Swab     Status: None   Collection Time: 08/25/20  7:56 PM   Specimen: Nasopharyngeal Swab; Nasopharyngeal(NP) swabs in vial transport medium  Result Value Ref Range   SARS Coronavirus 2 by RT PCR NEGATIVE NEGATIVE    Comment: (NOTE) SARS-CoV-2 target nucleic acids are NOT DETECTED.  The SARS-CoV-2 RNA is generally detectable in upper respiratory specimens during the acute phase of infection. The lowest concentration of SARS-CoV-2 viral copies this assay can detect is 138 copies/mL. A negative result does not preclude SARS-Cov-2 infection and should not be used as the sole basis for treatment or other patient management decisions. A negative result may occur with  improper specimen collection/handling, submission of specimen other than nasopharyngeal swab, presence of viral mutation(s) within the areas targeted by this assay, and inadequate number of viral copies(<138 copies/mL). A negative result must be combined with clinical observations, patient history, and epidemiological information. The expected result is Negative.  Fact Sheet for Patients:  EntrepreneurPulse.com.au  Fact Sheet for Healthcare Providers:  IncredibleEmployment.be  This test is no t yet approved or cleared by the [place] FDA and  has been authorized for detection and/or diagnosis of SARS-CoV-2 by FDA under an Emergency Use Authorization (EUA). This EUA will remain  in effect (meaning this test can be used) for the duration of the COVID-19 declaration under Section 564(b)(1) of the Act, 21 U.S.C.section 360bbb-3(b)(1), unless the authorization is terminated  or revoked sooner.       Influenza A by PCR NEGATIVE NEGATIVE   Influenza B by PCR NEGATIVE NEGATIVE    Comment: (NOTE) The Xpert Xpress SARS-CoV-2/FLU/RSV plus assay  is intended as an aid in the diagnosis of influenza from Nasopharyngeal swab specimens and should not be  used as a sole basis for treatment. Nasal washings and aspirates are unacceptable for Xpert Xpress SARS-CoV-2/FLU/RSV testing.  Fact Sheet for Patients: EntrepreneurPulse.com.au  Fact Sheet for Healthcare Providers: IncredibleEmployment.be  This test is not yet approved or cleared by the [place] FDA and has been authorized for detection and/or diagnosis of SARS-CoV-2 by FDA under an Emergency Use Authorization (EUA). This EUA will remain in effect (meaning this test can be used) for the duration of the COVID-19 declaration under Section 564(b)(1) of the Act, 21 U.S.C. section 360bbb-3(b)(1), unless the authorization is terminated or revoked.  Performed at [facility], [place] [address] [place], [place] [address]    CT ABDOMEN PELVIS W CONTRAST  Result Date: 08/25/2020 CLINICAL DATA:  Right lower quadrant pain EXAM: CT ABDOMEN AND PELVIS WITH CONTRAST TECHNIQUE: Multidetector CT imaging of the abdomen and pelvis was performed using the standard protocol following bolus administration of intravenous contrast. CONTRAST:  161mL OMNIPAQUE IOHEXOL 300 MG/ML  SOLN COMPARISON:  None. FINDINGS: Lower chest: Scarring in the lung bases.  No effusions. Hepatobiliary: No focal liver abnormality is seen. Status post cholecystectomy. No biliary dilatation. Pancreas: No focal abnormality or ductal dilatation. Spleen: No focal abnormality.  Normal size. Adrenals/Urinary Tract: No adrenal abnormality. No focal renal abnormality. No stones or hydronephrosis. Urinary bladder is unremarkable. Stomach/Bowel: Mildly dilated appendix with surrounding inflammation compatible with acute appendicitis. Stomach, large and small bowel grossly unremarkable. Vascular/Lymphatic: No evidence of aneurysm or adenopathy. Reproductive: Uterus and adnexa unremarkable.  No mass.  Other: No free fluid or free air. Musculoskeletal: No acute bony abnormality. IMPRESSION: Dilated, inflamed appendix compatible with appendicitis. No complicating feature. These results were called by telephone at the time of interpretation on 08/25/2020 at 7:30 pm to provider [facility] , who verbally acknowledged these results. Electronically Signed   By: Rolm Baptise M.D.   On: 08/25/2020 19:31    Review of Systems  Constitutional: Negative for chills and fever.  HENT: Negative for ear discharge, hearing loss and sore throat.   Eyes: Negative for discharge.  Respiratory: Negative for cough and shortness of breath.   Cardiovascular: Negative for chest pain and leg swelling.  Gastrointestinal: Positive for abdominal pain, diarrhea and nausea. Negative for constipation and vomiting.  Musculoskeletal: Negative for myalgias and neck pain.  Skin: Negative for rash.  Allergic/Immunologic: Negative for environmental allergies.  Neurological: Negative for dizziness and seizures.  Hematological: Does not bruise/bleed easily.  Psychiatric/Behavioral: Negative for suicidal ideas.  All other systems reviewed and are negative.   Blood pressure 116/69, pulse 93, temperature 99.5 F (37.5 C), temperature source Oral, resp. rate 14, height 5\' 3"  (1.6 m), weight (!) 140.6 kg, SpO2 97 %. Physical Exam Constitutional:      Appearance: She is well-developed.     Comments: Conversant No acute distress  Eyes:     General: Lids are normal. No scleral icterus.    Comments: Pupils are equal round and reactive No lid lag Moist conjunctiva  Neck:     Thyroid: No thyromegaly.     Trachea: No tracheal tenderness.     Comments: No cervical lymphadenopathy Cardiovascular:     Rate and Rhythm: Normal rate and regular rhythm.     Heart sounds: No murmur heard.   Pulmonary:     Effort: Pulmonary effort is normal.     Breath sounds: Normal breath sounds. No wheezing or rales.  Abdominal:     Tenderness:  There is abdominal tenderness. There is no guarding or  rebound. Positive signs include McBurney's sign. Negative signs include Rovsing's sign and psoas sign.     Hernia: No hernia is present.  Skin:    General: Skin is warm.     Findings: No rash.     Nails: There is no clubbing.     Comments: Normal skin turgor  Neurological:     Mental Status: She is alert and oriented to person, place, and time.     Comments: Normal gait and station  Psychiatric:        Judgment: Judgment normal.     Comments: Appropriate affect      Assessment/Plan 55 year old female with acute appendicitis 1.  We will proceed to the operating for laparoscopic appendectomy 2.I discussed with the patient the risks benefits of the procedure to include but not limited to: Infection, bleeding, damage to surrounding structures, possible ileus, possible postoperative infection. Patient voiced understanding and wishes to proceed.   Ralene Ok, MD 08/25/2020, 8:59 PM

## 2020-08-25 NOTE — Anesthesia Procedure Notes (Signed)
Procedure Name: Intubation Date/Time: 08/25/2020 9:33 PM Performed by: Jearld Pies, CRNA Pre-anesthesia Checklist: Patient identified, Emergency Drugs available, Suction available and Patient being monitored Patient Re-evaluated:Patient Re-evaluated prior to induction Oxygen Delivery Method: Circle System Utilized Preoxygenation: Pre-oxygenation with 100% oxygen Induction Type: IV induction, Rapid sequence and Cricoid Pressure applied Laryngoscope Size: Miller and 2 Grade View: Grade I Tube type: Oral Tube size: 7.0 mm Number of attempts: 1 Airway Equipment and Method: Stylet Placement Confirmation: ETT inserted through vocal cords under direct vision,  positive ETCO2 and breath sounds checked- equal and bilateral Secured at: 22 cm Tube secured with: Tape Dental Injury: Teeth and Oropharynx as per pre-operative assessment

## 2020-08-25 NOTE — Transfer of Care (Signed)
Immediate Anesthesia Transfer of Care Note  Patient: Victoria Hunt  Procedure(s) Performed: APPENDECTOMY LAPAROSCOPIC (N/A Abdomen)  Patient Location: PACU  Anesthesia Type:General  Level of Consciousness: awake, alert  and oriented  Airway & Oxygen Therapy: Patient Spontanous Breathing  Post-op Assessment: Report given to RN and Post -op Vital signs reviewed and stable  Post vital signs: Reviewed and stable  Last Vitals:  Vitals Value Taken Time  BP 156/82 08/25/20 2222  Temp    Pulse 91 08/25/20 2223  Resp 20 08/25/20 2223  SpO2 100 % 08/25/20 2223  Vitals shown include unvalidated device data.  Last Pain:  Vitals:   08/25/20 1655  TempSrc: Oral  PainSc:          Complications: No complications documented.

## 2020-08-25 NOTE — Progress Notes (Signed)
Victoria Hunt is a 55 y.o. female patient admitted. Awake, alert - oriented  X 4 - no acute distress noted.  VSS - Blood pressure (!) 158/83, pulse 88, temperature 99.1 F (37.3 C), temperature source Oral, resp. rate 18, height 5\' 3"  (1.6 m), weight (!) 140.6 kg, SpO2 95 %.    IV in place, occlusive dsg intact without redness.   Will cont to eval and treat per MD orders.  Vidal Schwalbe, RN 08/25/2020 11:45 PM

## 2020-08-25 NOTE — Op Note (Signed)
08/25/2020  10:10 PM  PATIENT:  Victoria Hunt  55 y.o. female  PRE-OPERATIVE DIAGNOSIS:  Appendicitis  POST-OPERATIVE DIAGNOSIS:  Acute appendicitis  PROCEDURE:  Procedure(s): APPENDECTOMY LAPAROSCOPIC (N/A)  SURGEON:  Surgeon(s) and Role:    Ralene Ok, MD - Primary  ANESTHESIA:   local and general  EBL:  minimal   BLOOD ADMINISTERED:none  DRAINS: none   LOCAL MEDICATIONS USED:  BUPIVICAINE   SPECIMEN:  Source of Specimen:  appendix  DISPOSITION OF SPECIMEN:  PATHOLOGY  COUNTS:  YES  TOURNIQUET:  * No tourniquets in log *  DICTATION: .Dragon Dictation Complications: none  Counts: reported as correct x 2  Findings:  The patient had an acutely inflamed appendix  Specimen: Appendix  Indications for procedure:  The patient is a 55 year old female with a history of periumbilical pain localized in the right lower quadrant patient had a CT scan which revealed signs consistent with acute appendicitis the patient back in for laparoscopic appendectomy.  Details of the procedure:The patient was taken back to the operating room. The patient was placed in supine position with bilateral SCDs in place.The patient was prepped and draped in the usual sterile fashion.  After appropriate anitbiotics were confirmed, a time-out was confirmed and all facts were verified.    A pneumoperitoneum of 14 mmHg was obtained via a Veress needle technique in the left lower quadrant quadrant.  A 5 mm trocar and 5 mm camera then placed intra-abdominally there is no injury to any intra-abdominal organs a 10 mm infraumbilical port was placed and direct visualization as was a 5 mm port in the suprapubic area.   The appendix was identified and seen to be non-perforated.  The appendix was cleaned down to the appendiceal base. The mesoappendix was then incised and the appendiceal artery was cauterized.  The the appendiceal base was clean.  A gold hemoclip was placed proximallyx2 and one distally and  the appendix was transected between these 2. A retrieval bag was then placed into the abdomen and the specimen placed in the bag. The appendiceal stump was cauterized. We evacuate the fluid from the pelvis until the effluent was clear.  The appendix and retrieval  bag was then retrieved via the supraumbilical port. #1 Vicryl was used to reapproximate the fascia at the umbilical port site x1. The skin was reapproximated all port sites 3-0 Monocryl subcuticular fashion. The skin was dressed with Dermabond.   The patient was awakened from general anesthesia was taken to recovery room in stable condition.      PLAN OF CARE: Admit for overnight observation  PATIENT DISPOSITION:  PACU - hemodynamically stable.   Delay start of Pharmacological VTE agent (>24hrs) due to surgical blood loss or risk of bleeding: not applicable

## 2020-08-25 NOTE — ED Triage Notes (Signed)
Emergency Medicine Provider Triage Evaluation Note  Victoria Hunt , a 55 y.o. female  was evaluated in triage.  Pt complains of right flank pain x1 day associated with intermittent sharp sensation in upper abdomen. She also admits to 2 episodes of non-bloody, non-bilious emesis and 1 episode of non-bloody diarrhea today. No fever or chills. History of previous cholecystectomy, but no other abdominal operations. Denies urinary and vaginal symptoms. No overlying rash. No injury to area. No history of kidney stones  Review of Systems  Positive: Abdominal pain, nausea, vomiting, diarrhea Negative: fever  Physical Exam  BP (!) 174/107 (BP Location: Left Arm)   Pulse (!) 123   Temp 98.6 F (37 C) (Oral)   Resp (!) 22   SpO2 98%  Gen:   Awake, no distress   HEENT:  Atraumatic  Resp:  Normal effort  Cardiac:  tachycardic Abd:   Nondistended, diffuse abdominal tenderness. Negative CVA tenderness bilaterally MSK:   Moves extremities without difficulty  Neuro:  Speech clear   Medical Decision Making  Medically screening exam initiated at 2:19 PM.  Appropriate orders placed.  Victoria Hunt was informed that the remainder of the evaluation will be completed by another provider, this initial triage assessment does not replace that evaluation, and the importance of remaining in the ED until their evaluation is complete.  Clinical Impression  Right flank/abdominal pain x 1. Labs ordered. Patient tachycardic at triage; however, non-septic appearing.   [name], [place] 08/25/20 1425

## 2020-08-25 NOTE — ED Notes (Signed)
Introduced self to pt. Pt placed on cardiac monitor. Pt reports 2/10 pain currently, 10/10 pain when moving or when RLQ is palpated. No complaints at this time. Will continue to monitor.

## 2020-08-25 NOTE — ED Provider Notes (Signed)
[facility] EMERGENCY DEPARTMENT Provider Note   CSN: [id number] Arrival date & time: 08/25/20  1404     History Chief Complaint  Patient presents with  . Abdominal Pain    Victoria Hunt is a 55 y.o. female.  HPI   55 year old female with a history of cholecystectomy who presents to the emergency department today for evaluation of abdominal pain.  States she started having nausea yesterday then developed a stabbing abdominal pain to the right lower quadrant and right flank area.  She reports associated episodes of vomiting and one episode of diarrhea.  Pain is worsened since onset and is worse with movement.  It improves at rest.  She denies any dysuria, frequency or urgency.  Denies any fevers.  Denies any abnormal vaginal bleeding.  Past Medical History:  Diagnosis Date  . Morbidly obese ([place])     There are no problems to display for this patient.   Past Surgical History:  Procedure Laterality Date  . CHOLECYSTECTOMY       OB History   No obstetric history on file.     No family history on file.  Social History   Tobacco Use  . Smoking status: Passive Smoke Exposure - Never Smoker    Home Medications Prior to Admission medications   Medication Sig Start Date End Date Taking? Authorizing Provider  azithromycin (ZITHROMAX) 250 MG tablet Take 1 tablet (250 mg total) by mouth daily. Take first 2 tablets together, then 1 every day until finished. 09/13/14   Delos Haring, PA-C  HYDROcodone-acetaminophen (HYCET) 7.5-325 mg/15 ml solution Take 10 mLs by mouth 4 (four) times daily as needed for moderate pain. 09/13/14   Delos Haring, PA-C  ibuprofen (ADVIL,MOTRIN) 400 MG tablet Take 400 mg by mouth every 6 (six) hours as needed.    [provider]  predniSONE (DELTASONE) 10 MG tablet Take 2 tablets (20 mg total) by mouth daily. 09/13/14   Delos Haring, PA-C    Allergies    Patient has no known allergies.  Review of Systems   Review  of Systems  Constitutional: Negative for chills and fever.  HENT: Negative for ear pain and sore throat.   Eyes: Negative for visual disturbance.  Respiratory: Negative for cough and shortness of breath.   Cardiovascular: Negative for chest pain.  Gastrointestinal: Positive for abdominal pain, diarrhea, nausea and vomiting.  Genitourinary: Negative for dysuria and hematuria.  Musculoskeletal: Negative for back pain.  Skin: Negative for rash.  Neurological: Negative for headaches.  All other systems reviewed and are negative.   Physical Exam Updated Vital Signs BP 132/79   Pulse 94   Temp 99.5 F (37.5 C) (Oral)   Resp 16   Ht 5\' 3"  (1.6 m)   Wt (!) 140.6 kg   LMP  (LMP Unknown)   SpO2 98%   BMI 54.91 kg/m   Physical Exam Vitals and nursing note reviewed.  Constitutional:      General: She is not in acute distress.    Appearance: She is well-developed.  HENT:     Head: Normocephalic and atraumatic.  Eyes:     Conjunctiva/sclera: Conjunctivae normal.  Cardiovascular:     Rate and Rhythm: Normal rate and regular rhythm.     Heart sounds: Normal heart sounds. No murmur heard.   Pulmonary:     Effort: Pulmonary effort is normal. No respiratory distress.     Breath sounds: Normal breath sounds. No wheezing, rhonchi or rales.  Abdominal:  General: Bowel sounds are normal.     Palpations: Abdomen is soft.     Tenderness: There is abdominal tenderness in the right lower quadrant. There is right CVA tenderness and guarding. There is no rebound.  Musculoskeletal:     Cervical back: Neck supple.  Skin:    General: Skin is warm and dry.  Neurological:     Mental Status: She is alert.     ED Results / Procedures / Treatments   Labs (all labs ordered are listed, but only abnormal results are displayed) Labs Reviewed  CBC WITH DIFFERENTIAL/PLATELET - Abnormal; Notable for the following components:      Result Value   WBC 16.7 (*)    Neutro Abs 13.9 (*)    Abs  Immature Granulocytes 0.08 (*)    All other components within normal limits  COMPREHENSIVE METABOLIC PANEL - Abnormal; Notable for the following components:   Sodium 134 (*)    Glucose, Bld 139 (*)    All other components within normal limits  RESP PANEL BY RT-PCR (FLU A&B, COVID) ARPGX2  LIPASE, BLOOD  URINALYSIS, ROUTINE W REFLEX MICROSCOPIC  I-STAT BETA HCG BLOOD, ED ([facility], [facility], [facility] ONLY)    EKG None  Radiology CT ABDOMEN PELVIS W CONTRAST  Result Date: 08/25/2020 CLINICAL DATA:  Right lower quadrant pain EXAM: CT ABDOMEN AND PELVIS WITH CONTRAST TECHNIQUE: Multidetector CT imaging of the abdomen and pelvis was performed using the standard protocol following bolus administration of intravenous contrast. CONTRAST:  128mL OMNIPAQUE IOHEXOL 300 MG/ML  SOLN COMPARISON:  None. FINDINGS: Lower chest: Scarring in the lung bases.  No effusions. Hepatobiliary: No focal liver abnormality is seen. Status post cholecystectomy. No biliary dilatation. Pancreas: No focal abnormality or ductal dilatation. Spleen: No focal abnormality.  Normal size. Adrenals/Urinary Tract: No adrenal abnormality. No focal renal abnormality. No stones or hydronephrosis. Urinary bladder is unremarkable. Stomach/Bowel: Mildly dilated appendix with surrounding inflammation compatible with acute appendicitis. Stomach, large and small bowel grossly unremarkable. Vascular/Lymphatic: No evidence of aneurysm or adenopathy. Reproductive: Uterus and adnexa unremarkable.  No mass. Other: No free fluid or free air. Musculoskeletal: No acute bony abnormality. IMPRESSION: Dilated, inflamed appendix compatible with appendicitis. No complicating feature. These results were called by telephone at the time of interpretation on 08/25/2020 at 7:30 pm to provider [facility] , who verbally acknowledged these results. Electronically Signed   By: Rolm Baptise M.D.   On: 08/25/2020 19:31    Procedures Procedures   Medications Ordered in  ED Medications  cefTRIAXone (ROCEPHIN) 2 g in sodium chloride 0.9 % 100 mL IVPB (has no administration in time range)    And  metroNIDAZOLE (FLAGYL) IVPB 500 mg (has no administration in time range)  sodium chloride 0.9 % bolus 1,000 mL (1,000 mLs Intravenous New Bag/Given 08/25/20 1742)  ketorolac (TORADOL) 30 MG/ML injection 30 mg (30 mg Intravenous Given 08/25/20 1747)  ondansetron (ZOFRAN) injection 4 mg (4 mg Intravenous Given 08/25/20 1745)  iohexol (OMNIPAQUE) 300 MG/ML solution 100 mL (100 mLs Intravenous Contrast Given 08/25/20 1911)    ED Course  I have reviewed the triage vital signs and the nursing notes.  Pertinent labs & imaging results that were available during my care of the patient were reviewed by me and considered in my medical decision making (see chart for details).    MDM Rules/Calculators/A&P                          55 year old female  presenting to the emergency department today for evaluation of right-sided abdominal pain that started last night and worsened this morning.  Reviewed/interpreted labs CBC with leukocytosis of 16,000, no anemia CMP with mild hyponatremia, otherwise unremarkable Lipase negative Beta hCG negative UA pending on admission covid pending on admission  CT abd/pelvis reviewed/interpreted - Dilated, inflamed appendix compatible with appendicitis. No complicating feature.   -ivf and abx given, pain under control on reassessment  7:35 PM CONSULT with Dr. Rosendo Gros with general surgery who accepts patient for admission.   Final Clinical Impression(s) / ED Diagnoses Final diagnoses:  Appendicitis, unspecified appendicitis type    Rx / DC Orders ED Discharge Orders    None       Bishop Dublin 08/25/20 1956    Gareth Morgan, MD 08/26/20 1209

## 2020-08-25 NOTE — ED Triage Notes (Signed)
Pt noticed yesterday evening had severe stomach aches. The pain is mainly in the right lower side became tender and sharp pain. Pt rates pain 10/10. Pt states lying down helps ease the pain. She vomited 2x and 1 episode of diarrhea.

## 2020-08-26 ENCOUNTER — Encounter (HOSPITAL_COMMUNITY): Payer: Self-pay | Admitting: General Surgery

## 2020-08-26 LAB — URINALYSIS, ROUTINE W REFLEX MICROSCOPIC
Bilirubin Urine: NEGATIVE
Glucose, UA: NEGATIVE mg/dL
Ketones, ur: NEGATIVE mg/dL
Leukocytes,Ua: NEGATIVE
Nitrite: NEGATIVE
Protein, ur: NEGATIVE mg/dL
Specific Gravity, Urine: 1.01 (ref 1.005–1.030)
pH: 6 (ref 5.0–8.0)

## 2020-08-26 MED ORDER — ACETAMINOPHEN 500 MG PO TABS: 500.0000 mg | ORAL_TABLET | Freq: Four times a day (QID) | ORAL | Status: DC | PRN

## 2020-08-26 MED ORDER — OXYCODONE HCL 5 MG PO TABS: 5.0000 mg | ORAL_TABLET | Freq: Four times a day (QID) | ORAL | 0 refills | Status: DC | PRN

## 2020-08-26 NOTE — Discharge Summary (Signed)
[facility] Discharge Summary   Patient ID: Victoria Hunt MRN: [id number] DOB/AGE: 1965-06-14 55 y.o.  Admit date: 08/25/2020 Discharge date: 08/26/2020  Admitting Diagnosis: Acute appendicitis  Discharge Diagnosis Patient Active Problem List   Diagnosis Date Noted  . Acute appendicitis 08/25/2020    Consultants None   Imaging: CT ABDOMEN PELVIS W CONTRAST  Result Date: 08/25/2020 CLINICAL DATA:  Right lower quadrant pain EXAM: CT ABDOMEN AND PELVIS WITH CONTRAST TECHNIQUE: Multidetector CT imaging of the abdomen and pelvis was performed using the standard protocol following bolus administration of intravenous contrast. CONTRAST:  167mL OMNIPAQUE IOHEXOL 300 MG/ML  SOLN COMPARISON:  None. FINDINGS: Lower chest: Scarring in the lung bases.  No effusions. Hepatobiliary: No focal liver abnormality is seen. Status post cholecystectomy. No biliary dilatation. Pancreas: No focal abnormality or ductal dilatation. Spleen: No focal abnormality.  Normal size. Adrenals/Urinary Tract: No adrenal abnormality. No focal renal abnormality. No stones or hydronephrosis. Urinary bladder is unremarkable. Stomach/Bowel: Mildly dilated appendix with surrounding inflammation compatible with acute appendicitis. Stomach, large and small bowel grossly unremarkable. Vascular/Lymphatic: No evidence of aneurysm or adenopathy. Reproductive: Uterus and adnexa unremarkable.  No mass. Other: No free fluid or free air. Musculoskeletal: No acute bony abnormality. IMPRESSION: Dilated, inflamed appendix compatible with appendicitis. No complicating feature. These results were called by telephone at the time of interpretation on 08/25/2020 at 7:30 pm to provider [facility] , who verbally acknowledged these results. Electronically Signed   By: Rolm Baptise M.D.   On: 08/25/2020 19:31    Procedures Dr. Ralene Ok (08/25/20) - Laparoscopic Appendectomy  Hospital Course:  Patient is a 55 year old female who  [facility] with abdominal pain.  Workup showed acute appendicitis.  Patient was admitted and underwent procedure listed above.  Tolerated procedure well and was transferred to the floor.  Diet was advanced as tolerated.  On POD#1, the patient was voiding well, tolerating diet, ambulating well, pain well controlled, vital signs stable, incisions c/d/i and felt stable for discharge home.  Patient will follow up in our office in 3 weeks and knows to call with questions or concerns. She will call to confirm appointment date/time.    Physical Exam: General:  Alert, NAD, pleasant, comfortable Abd:  Soft, ND, mild tenderness, incisions C/D/I  I or a member of my team have reviewed this patient in the Controlled Substance Database.   Allergies as of 08/26/2020   No Known Allergies     Medication List    TAKE these medications   acetaminophen 500 MG tablet Commonly known as: TYLENOL Take 1 tablet (500 mg total) by mouth every 6 (six) hours as needed for mild pain or fever.   azithromycin 250 MG tablet Commonly known as: ZITHROMAX Take 1 tablet (250 mg total) by mouth daily. Take first 2 tablets together, then 1 every day until finished.   HYDROcodone-acetaminophen 7.5-325 mg/15 ml solution Commonly known as: Hycet Take 10 mLs by mouth 4 (four) times daily as needed for moderate pain.   ibuprofen 400 MG tablet Commonly known as: ADVIL Take 400 mg by mouth every 6 (six) hours as needed.   oxyCODONE 5 MG immediate release tablet Commonly known as: Oxy IR/ROXICODONE Take 1 tablet (5 mg total) by mouth every 6 (six) hours as needed for moderate pain or severe pain.   predniSONE 10 MG tablet Commonly known as: DELTASONE Take 2 tablets (20 mg total) by mouth daily.         Follow-up Information  Surgery, [place]. Call.   Specialty: General Surgery Why: Call to confirm appointment date/time. Our office is scheduling a follow up appointment for you in about 3 weeks.   Contact information: [place] [address] [phone]               Signed: Norm Parcel , [facility] 08/26/2020, 8:04 AM Please see Amion for pager number during day hours 7:00am-4:30pm

## 2020-08-26 NOTE — Progress Notes (Signed)
Victoria Hunt to be D/C'd  per MD order. Discussed with the patient and all questions fully answered.  VSS, Skin clean, dry and intact without evidence of skin break down, no evidence of skin tears noted.  IV catheter discontinued intact. Site without signs and symptoms of complications. Dressing and pressure applied.  An After Visit Summary was printed and given to the patient. Patient received prescription.  D/c education completed with patient/family including follow up instructions, medication list, d/c activities limitations if indicated, with other d/c instructions as indicated by MD - patient able to verbalize understanding, all questions fully answered.   Patient instructed to return to ED, call 911, or call MD for any changes in condition.   Patient to be escorted via [place], and D/C home via private auto.

## 2020-08-26 NOTE — Discharge Instructions (Signed)
[facility] LAPAROSCOPIC SURGERY: POST OP INSTRUCTIONS Always review your discharge instruction sheet given to you by the facility where your surgery was performed. IF YOU HAVE DISABILITY OR FAMILY LEAVE FORMS, YOU MUST BRING THEM TO THE OFFICE FOR PROCESSING.   DO NOT GIVE THEM TO YOUR DOCTOR.  PAIN CONTROL  1. First take acetaminophen (Tylenol) AND/or ibuprofen (Advil) to control your pain after surgery.  Follow directions on package.  Taking acetaminophen (Tylenol) and/or ibuprofen (Advil) regularly after surgery will help to control your pain and lower the amount of prescription pain medication you may need.  You should not take more than 3,000 mg (3 grams) of acetaminophen (Tylenol) in 24 hours.  You should not take ibuprofen (Advil), aleve, motrin, naprosyn or other NSAIDS if you have a history of stomach ulcers or chronic kidney disease.  2. A prescription for pain medication may be given to you upon discharge.  Take your pain medication as prescribed, if you still have uncontrolled pain after taking acetaminophen (Tylenol) or ibuprofen (Advil). 3. Use ice packs to help control pain. 4. If you need a refill on your pain medication, please contact your pharmacy.  They will contact our office to request authorization. Prescriptions will not be filled after 5pm or on week-ends.  HOME MEDICATIONS 5. Take your usually prescribed medications unless otherwise directed.  DIET 6. You should follow a light diet the first few days after arrival home.  Be sure to include lots of fluids daily. Avoid fatty, fried foods.   CONSTIPATION 7. It is common to experience some constipation after surgery and if you are taking pain medication.  Increasing fluid intake and taking a stool softener (such as Colace) will usually help or prevent this problem from occurring.  A mild laxative (Milk of Magnesia or Miralax) should be taken according to package instructions if there are no bowel  movements after 48 hours.  WOUND/INCISION CARE 8. Most patients will experience some swelling and bruising in the area of the incisions.  Ice packs will help.  Swelling and bruising can take several days to resolve.  9. Unless discharge instructions indicate otherwise, follow guidelines below  a. STERI-STRIPS - you may remove your outer bandages 48 hours after surgery, and you may shower at that time.  You have steri-strips (small skin tapes) in place directly over the incision.  These strips should be left on the skin for 7-10 days.   b. DERMABOND/SKIN GLUE - you may shower in 24 hours.  The glue will flake off over the next 2-3 weeks. 10. Any sutures or staples will be removed at the office during your follow-up visit.  ACTIVITIES 11. You may resume regular (light) daily activities beginning the next day--such as daily self-care, walking, climbing stairs--gradually increasing activities as tolerated.  You may have sexual intercourse when it is comfortable.  Refrain from any heavy lifting or straining until approved by your doctor. a. You may drive when you are no longer taking prescription pain medication, you can comfortably wear a seatbelt, and you can safely maneuver your car and apply brakes.  FOLLOW-UP 12. You should see your doctor in the office for a follow-up appointment approximately 2-3 weeks after your surgery.  You should have been given your post-op/follow-up appointment when your surgery was scheduled.  If you did not receive a post-op/follow-up appointment, make sure that you call for this appointment within a day or two after you arrive home to insure a convenient appointment time.     WHEN TO CALL YOUR DOCTOR: 1. Fever over 101.0 2. Inability to urinate 3. Continued bleeding from incision. 4. Increased pain, redness, or drainage from the incision. 5. Increasing abdominal pain  The clinic staff is available to answer your questions during regular business hours.  Please don't  hesitate to call and ask to speak to one of the nurses for clinical concerns.  If you have a medical emergency, go to the nearest emergency room or call 911.  A surgeon from [facility] is always on call at the hospital. [address], [address], [place], [place]  [address] ? [address] [address], [place], [place]   [address] [phone] ? [phone] ? FAX [phone] Web site: [email or web address]  .........   Managing Your Pain After Surgery Without Opioids    Thank you for participating in our program to help patients manage their pain after surgery without opioids. This is part of our effort to provide you with the best care possible, without exposing you or your family to the risk that opioids pose.  What pain can I expect after surgery? You can expect to have some pain after surgery. This is normal. The pain is typically worse the day after surgery, and quickly begins to get better. Many studies have found that many patients are able to manage their pain after surgery with Over-the-Counter (OTC) medications such as Tylenol and Motrin. If you have a condition that does not allow you to take Tylenol or Motrin, notify your surgical team.  How will I manage my pain? The best strategy for controlling your pain after surgery is around the clock pain control with Tylenol (acetaminophen) and Motrin (ibuprofen or Advil). Alternating these medications with each other allows you to maximize your pain control. In addition to Tylenol and Motrin, you can use heating pads or ice packs on your incisions to help reduce your pain.  How will I alternate your regular strength over-the-counter pain medication? You will take a dose of pain medication every three hours. ; Start by taking 650 mg of Tylenol (2 pills of 325 mg) ; 3 hours later take 600 mg of Motrin (3 pills of 200 mg) ; 3 hours after taking the Motrin take 650 mg of Tylenol ; 3 hours after that take 600 mg of  Motrin.   - 1 -  See example - if your first dose of Tylenol is at 12:00 PM   12:00 PM Tylenol 650 mg (2 pills of 325 mg)  3:00 PM Motrin 600 mg (3 pills of 200 mg)  6:00 PM Tylenol 650 mg (2 pills of 325 mg)  9:00 PM Motrin 600 mg (3 pills of 200 mg)  Continue alternating every 3 hours   We recommend that you follow this schedule around-the-clock for at least 3 days after surgery, or until you feel that it is no longer needed. Use the table on the last page of this handout to keep track of the medications you are taking. Important: Do not take more than 3000mg of Tylenol or 3200mg of Motrin in a 24-hour period. Do not take ibuprofen/Motrin if you have a history of bleeding stomach ulcers, severe kidney disease, &/or actively taking a blood thinner  What if I still have pain? If you have pain that is not controlled with the over-the-counter pain medications (Tylenol and Motrin or Advil) you might have what we call "breakthrough" pain. You will receive a prescription for a small amount of an opioid pain medication such as   Oxycodone, Tramadol, or Tylenol with Codeine. Use these opioid pills in the first 24 hours after surgery if you have breakthrough pain. Do not take more than 1 pill every 4-6 hours.  If you still have uncontrolled pain after using all opioid pills, don't hesitate to call our staff using the number provided. We will help make sure you are managing your pain in the best way possible, and if necessary, we can provide a prescription for additional pain medication.   Day 1    Time  Name of Medication Number of pills taken  Amount of Acetaminophen  Pain Level   Comments  AM PM       AM PM       AM PM       AM PM       AM PM       AM PM       AM PM       AM PM       Total Daily amount of Acetaminophen Do not take more than  3,000 mg per day      Day 2    Time  Name of Medication Number of pills taken  Amount of Acetaminophen  Pain Level   Comments  AM  PM       AM PM       AM PM       AM PM       AM PM       AM PM       AM PM       AM PM       Total Daily amount of Acetaminophen Do not take more than  3,000 mg per day      Day 3    Time  Name of Medication Number of pills taken  Amount of Acetaminophen  Pain Level   Comments  AM PM       AM PM       AM PM       AM PM          AM PM       AM PM       AM PM       AM PM       Total Daily amount of Acetaminophen Do not take more than  3,000 mg per day      Day 4    Time  Name of Medication Number of pills taken  Amount of Acetaminophen  Pain Level   Comments  AM PM       AM PM       AM PM       AM PM       AM PM       AM PM       AM PM       AM PM       Total Daily amount of Acetaminophen Do not take more than  3,000 mg per day      Day 5    Time  Name of Medication Number of pills taken  Amount of Acetaminophen  Pain Level   Comments  AM PM       AM PM       AM PM       AM PM       AM PM       AM PM       AM PM         AM PM       Total Daily amount of Acetaminophen Do not take more than  3,000 mg per day       Day 6    Time  Name of Medication Number of pills taken  Amount of Acetaminophen  Pain Level  Comments  AM PM       AM PM       AM PM       AM PM       AM PM       AM PM       AM PM       AM PM       Total Daily amount of Acetaminophen Do not take more than  3,000 mg per day      Day 7    Time  Name of Medication Number of pills taken  Amount of Acetaminophen  Pain Level   Comments  AM PM       AM PM       AM PM       AM PM       AM PM       AM PM       AM PM       AM PM       Total Daily amount of Acetaminophen Do not take more than  3,000 mg per day        For additional information about how and where to safely dispose of unused opioid medications - https://www.morepowerfulnc.org  Disclaimer: This document contains information and/or instructional materials adapted from [facility]  for the typical patient with your condition. It does not replace medical advice from your health care provider because your experience may differ from that of the typical patient. Talk to your health care provider if you have any questions about this document, your condition or your treatment plan. Adapted from [facility]  

## 2020-08-27 LAB — SURGICAL PATHOLOGY

## 2020-08-28 NOTE — Anesthesia Postprocedure Evaluation (Signed)
Anesthesia Post Note  Patient: AKESHA URESTI  Procedure(s) Performed: APPENDECTOMY LAPAROSCOPIC (N/A Abdomen)     Patient location during evaluation: PACU Anesthesia Type: General Level of consciousness: awake and alert Pain management: pain level controlled Vital Signs Assessment: post-procedure vital signs reviewed and stable Respiratory status: spontaneous breathing, nonlabored ventilation, respiratory function stable and patient connected to nasal cannula oxygen Cardiovascular status: blood pressure returned to baseline and stable Postop Assessment: no apparent nausea or vomiting Anesthetic complications: no   No complications documented.  Last Vitals:  Vitals:   08/25/20 2325 08/26/20 0520  BP: (!) 158/83 139/76  Pulse: 88 88  Resp: 18 17  Temp: 37.3 C 36.9 C  SpO2: 95% 94%    Last Pain:  Vitals:   08/26/20 1000  TempSrc:   PainSc: 0-No pain                 Husam Hohn

## 2021-02-23 DIAGNOSIS — R002 Palpitations: Secondary | ICD-10-CM | POA: Diagnosis not present

## 2021-02-23 DIAGNOSIS — R42 Dizziness and giddiness: Secondary | ICD-10-CM | POA: Diagnosis not present

## 2021-02-23 DIAGNOSIS — R000 Tachycardia, unspecified: Secondary | ICD-10-CM | POA: Diagnosis not present

## 2021-02-23 DIAGNOSIS — R110 Nausea: Secondary | ICD-10-CM | POA: Diagnosis not present

## 2021-05-26 ENCOUNTER — Other Ambulatory Visit: Payer: Self-pay

## 2021-05-26 ENCOUNTER — Emergency Department (HOSPITAL_COMMUNITY): Payer: No Typology Code available for payment source

## 2021-05-26 ENCOUNTER — Emergency Department (HOSPITAL_COMMUNITY)
Admission: EM | Admit: 2021-05-26 | Discharge: 2021-05-26 | Disposition: A | Discharge status: Home / Self Care | Payer: No Typology Code available for payment source | Attending: Emergency Medicine | Admitting: Emergency Medicine

## 2021-05-26 ENCOUNTER — Encounter (HOSPITAL_COMMUNITY): Payer: Self-pay | Admitting: Emergency Medicine

## 2021-05-26 DIAGNOSIS — R002 Palpitations: Secondary | ICD-10-CM | POA: Insufficient documentation

## 2021-05-26 DIAGNOSIS — Z79899 Other long term (current) drug therapy: Secondary | ICD-10-CM | POA: Insufficient documentation

## 2021-05-26 DIAGNOSIS — N3000 Acute cystitis without hematuria: Secondary | ICD-10-CM

## 2021-05-26 LAB — URINALYSIS, ROUTINE W REFLEX MICROSCOPIC
Bilirubin Urine: NEGATIVE
Glucose, UA: NEGATIVE mg/dL
Ketones, ur: 20 mg/dL — AB
Nitrite: NEGATIVE
Protein, ur: 30 mg/dL — AB
Specific Gravity, Urine: 1.027 (ref 1.005–1.030)
WBC, UA: >50 WBC/hpf — ABNORMAL HIGH (ref 0–5)
pH: 5 (ref 5.0–8.0)

## 2021-05-26 LAB — CBC WITH DIFFERENTIAL/PLATELET
Abs Immature Granulocytes: 0.04 10*3/uL (ref 0.00–0.07)
Basophils Absolute: 0.1 10*3/uL (ref 0.0–0.1)
Basophils Relative: 1 %
Eosinophils Absolute: 0.2 10*3/uL (ref 0.0–0.5)
Eosinophils Relative: 2 %
HCT: 39.6 % (ref 36.0–46.0)
Hemoglobin: 13.2 g/dL (ref 12.0–15.0)
Immature Granulocytes: 0 %
Lymphocytes Relative: 20 %
Lymphs Abs: 1.8 10*3/uL (ref 0.7–4.0)
MCH: 29 pg (ref 26.0–34.0)
MCHC: 33.3 g/dL (ref 30.0–36.0)
MCV: 87 fL (ref 80.0–100.0)
Monocytes Absolute: 0.5 10*3/uL (ref 0.1–1.0)
Monocytes Relative: 6 %
Neutro Abs: 6.5 10*3/uL (ref 1.7–7.7)
Neutrophils Relative %: 71 %
Platelets: 301 10*3/uL (ref 150–400)
RBC: 4.55 MIL/uL (ref 3.87–5.11)
RDW: 13.3 % (ref 11.5–15.5)
WBC: 9.1 10*3/uL (ref 4.0–10.5)
nRBC: 0 % (ref 0.0–0.2)

## 2021-05-26 LAB — COMPREHENSIVE METABOLIC PANEL
ALT: 17 U/L (ref 0–44)
AST: 17 U/L (ref 15–41)
Albumin: 3.8 g/dL (ref 3.5–5.0)
Alkaline Phosphatase: 82 U/L (ref 38–126)
Anion gap: 9 (ref 5–15)
BUN: 15 mg/dL (ref 6–20)
CO2: 25 mmol/L (ref 22–32)
Calcium: 8.8 mg/dL — ABNORMAL LOW (ref 8.9–10.3)
Chloride: 102 mmol/L (ref 98–111)
Creatinine, Ser: 0.96 mg/dL (ref 0.44–1.00)
GFR, Estimated: >60 mL/min (ref >60)
Glucose, Bld: 129 mg/dL — ABNORMAL HIGH (ref 70–99)
Potassium: 3.6 mmol/L (ref 3.5–5.1)
Sodium: 136 mmol/L (ref 135–145)
Total Bilirubin: 0.9 mg/dL (ref 0.3–1.2)
Total Protein: 7.3 g/dL (ref 6.5–8.1)

## 2021-05-26 LAB — TROPONIN I (HIGH SENSITIVITY)
Troponin I (High Sensitivity): 4 ng/L (ref <18)
Troponin I (High Sensitivity): 4 ng/L (ref <18)

## 2021-05-26 LAB — TSH: TSH: 2.217 u[IU]/mL (ref 0.350–4.500)

## 2021-05-26 LAB — I-STAT BETA HCG BLOOD, ED (MC, WL, AP ONLY): I-stat hCG, quantitative: 9.7 m[IU]/mL — ABNORMAL HIGH (ref <5)

## 2021-05-26 MED ORDER — CEPHALEXIN 500 MG PO CAPS: 500.0000 mg | ORAL_CAPSULE | Freq: Three times a day (TID) | ORAL | 0 refills | Status: AC

## 2021-05-26 NOTE — Medical Student Note (Signed)
[facility] DEPT Provider Student Note For educational purposes for Medical, PA and NP students only and not part of the legal medical record.   CSN: [id number] Arrival date & time: 05/26/21  0254      History   Chief Complaint Chief Complaint  Patient presents with   Palpitations    HPI Victoria Hunt is a 56 y.o. female.  Pt presented to the ED this morning with reports of palpitations that occurred when she woke up to use the bathroom. She describes the episode as skipping beats and states she has experienced similar episodes since contracting covid in 2020. These episodes have become more frequent recently. The patient reports associated SOB with the episodes, which resolves quickly. ECG demonstrated NSR with PVCs. Troponins x2 negative, CBC and TSH unremarkable. CMP was notable for elevated glucose and hypocalcemia. At this time the patient reports her symptoms have improved. Will have her follow up with PCP about her visit and will also place a referral for cardiology outpatient for additional workup.  Of note, she also reports recently noticing blood in her urine with associated flank pain. She was prescribed an antibiotic for this which did not improve her symptoms. Will order a UA to be completed prior to her discharge. The patient also had an elevated bHCG in the ED. This was discussed with the patient and she was advised to follow up with PCP for a recheck.   The history is provided by the patient.  Palpitations Palpitations quality:  Irregular Onset quality:  Gradual Duration:  10 seconds Timing:  Sporadic Chronicity:  Recurrent Worsened by:  Nothing Associated symptoms: shortness of breath and weakness   Associated symptoms: no chest pain   Shortness of breath:    Severity:  Mild   Onset quality:  Sudden   Timing:  Sporadic   Progression:  Unchanged  Past Medical History:  Diagnosis Date   Morbidly obese ([place])     Patient Active Problem List   Diagnosis  Date Noted   Acute appendicitis 08/25/2020    Past Surgical History:  Procedure Laterality Date   CHOLECYSTECTOMY     LAPAROSCOPIC APPENDECTOMY N/A 08/25/2020   Procedure: APPENDECTOMY LAPAROSCOPIC;  Surgeon: Ralene Ok, MD;  Location: [facility] OR;  Service: General;  Laterality: N/A;    OB History   No obstetric history on file.      Home Medications    Prior to Admission medications   Medication Sig Start Date End Date Taking? Authorizing Provider  acetaminophen (TYLENOL) 500 MG tablet Take 1 tablet (500 mg total) by mouth every 6 (six) hours as needed for mild pain or fever. 08/26/20   Norm Parcel, PA-C  azithromycin (ZITHROMAX) 250 MG tablet Take 1 tablet (250 mg total) by mouth daily. Take first 2 tablets together, then 1 every day until finished. Patient not taking: Reported on 08/26/2020 09/13/14   Delos Haring, PA-C  HYDROcodone-acetaminophen (HYCET) 7.5-325 mg/15 ml solution Take 10 mLs by mouth 4 (four) times daily as needed for moderate pain. Patient not taking: Reported on 08/26/2020 09/13/14   Delos Haring, PA-C  oxyCODONE (OXY IR/ROXICODONE) 5 MG immediate release tablet Take 1 tablet (5 mg total) by mouth every 6 (six) hours as needed for moderate pain or severe pain. 08/26/20   Norm Parcel, PA-C  predniSONE (DELTASONE) 10 MG tablet Take 2 tablets (20 mg total) by mouth daily. Patient not taking: Reported on 08/26/2020 09/13/14   Delos Haring, PA-C    Family History No  family history on file.  Social History Social History   Tobacco Use   Smoking status: Passive Smoke Exposure - Never Smoker     Allergies   Patient has no known allergies.   Review of Systems Review of Systems  Constitutional:  Positive for chills.  Respiratory:  Positive for shortness of breath.   Cardiovascular:  Positive for palpitations. Negative for chest pain.  Gastrointestinal:  Abdominal pain: flank pain.  Genitourinary:  Positive for flank pain and hematuria.   Neurological:  Positive for weakness.  Psychiatric/Behavioral:  The patient is nervous/anxious.     Physical Exam Updated Vital Signs BP 126/78    Pulse 73    Temp 97.8 F (36.6 C)    Resp 16    SpO2 96%   Physical Exam Constitutional:      Appearance: Normal appearance. She is obese.  HENT:     Head: Normocephalic and atraumatic.  Cardiovascular:     Rate and Rhythm: Normal rate and regular rhythm.     Heart sounds: Normal heart sounds.  Pulmonary:     Effort: Pulmonary effort is normal.     Breath sounds: Normal breath sounds.  Skin:    General: Skin is warm and dry.  Neurological:     Mental Status: She is alert.  Psychiatric:        Mood and Affect: Mood normal.     ED Treatments / Results  Labs (all labs ordered are listed, but only abnormal results are displayed) Labs Reviewed  COMPREHENSIVE METABOLIC PANEL - Abnormal; Notable for the following components:      Result Value   Glucose, Bld 129 (*)    Calcium 8.8 (*)    All other components within normal limits  I-STAT BETA HCG BLOOD, ED ([facility], [facility], [facility] ONLY) - Abnormal; Notable for the following components:   I-stat hCG, quantitative 9.7 (*)    All other components within normal limits  CBC WITH DIFFERENTIAL/PLATELET  TSH  URINALYSIS, ROUTINE W REFLEX MICROSCOPIC  TROPONIN I (HIGH SENSITIVITY)  TROPONIN I (HIGH SENSITIVITY)    EKG  Radiology DG Chest 2 View  Result Date: 05/26/2021 CLINICAL DATA:  Palpitations, shortness of breath, nausea, heartburn. EXAM: CHEST - 2 VIEW COMPARISON:  09/13/2014 FINDINGS: The heart size and mediastinal contours are within normal limits. No consolidation, effusion, or pneumothorax. Mild degenerative changes are noted in the thoracic spine. IMPRESSION: No active cardiopulmonary disease. Electronically Signed   By: Brett Fairy M.D.   On: 05/26/2021 04:18    Procedures ED EKG  Date/Time: 05/26/2021 3:40 PM Performed by: Deno Etienne, DO Authorized by: Rodney Booze, PA-C    ECG reviewed by ED Physician in the absence of a cardiologist: yes   Previous ECG:    Previous ECG:  Compared to current Interpretation:    Interpretation: normal   Rate:    ECG rate assessment: normal   Rhythm:    Rhythm: sinus rhythm   Ectopy:    Ectopy: PVCs   QRS:    QRS axis:  Indeterminate ST segments:    ST segments:  Normal T waves:    T waves: normal   Q waves:    Abnormal Q-waves: not present   (including critical care time)  Medications Ordered in ED Medications - No data to display   Initial Impression / Assessment and Plan / ED Course  I have reviewed the triage vital signs and the nursing notes.  Pertinent labs & imaging results that were available during  my care of the patient were reviewed by me and considered in my medical decision making (see chart for details).      Final Clinical Impressions(s) / ED Diagnoses   Final diagnoses:  None    New Prescriptions New Prescriptions   No medications on file   C. Lorelee New, PA-S @TODAY @

## 2021-05-26 NOTE — ED Provider Notes (Signed)
[facility] EMERGENCY DEPARTMENT Provider Note   CSN: [id number] Arrival date & time: 05/26/21  0254     History  Chief Complaint  Patient presents with   Palpitations    Victoria Hunt is a 56 y.o. female.  Patient with history of appendectomy, no current PCP, family history of cardiac disease --presents to the emergency department for intermittent palpitations.  She has had palpitations intermittently for months.  More recently they have been more frequent.  She describes a racing sensation in her chest that is often accompanied by a burning sensation and a cold sensation.  She also describes feeling generally weak after these occurrences.  No lightheadedness or full syncope.  No vomiting.  Early this morning she awoke with a particularly severe and prolonged episode.  She presented to the emergency department to ensure that she was not having any significant heart problems, especially given her family history.  She has never seen a cardiologist.  She denies any recent cough or cold medications, drinks 1 cup of coffee a day.  The onset of this condition was acute. The course is resovled. Aggravating factors: none. Alleviating factors: none.   In addition, over the past 2 months she has had episodes of blood noted in her urine.  She had a telemetry doc visit and sounds like she was given a course of antibiotics to treat possible UTI.       Home Medications Prior to Admission medications   Medication Sig Start Date End Date Taking? Authorizing Provider  acetaminophen (TYLENOL) 500 MG tablet Take 1 tablet (500 mg total) by mouth every 6 (six) hours as needed for mild pain or fever. 08/26/20   Norm Parcel, PA-C  azithromycin (ZITHROMAX) 250 MG tablet Take 1 tablet (250 mg total) by mouth daily. Take first 2 tablets together, then 1 every day until finished. Patient not taking: Reported on 08/26/2020 09/13/14   Delos Haring, PA-C  HYDROcodone-acetaminophen (HYCET)  7.5-325 mg/15 ml solution Take 10 mLs by mouth 4 (four) times daily as needed for moderate pain. Patient not taking: Reported on 08/26/2020 09/13/14   Delos Haring, PA-C  oxyCODONE (OXY IR/ROXICODONE) 5 MG immediate release tablet Take 1 tablet (5 mg total) by mouth every 6 (six) hours as needed for moderate pain or severe pain. 08/26/20   Norm Parcel, PA-C  predniSONE (DELTASONE) 10 MG tablet Take 2 tablets (20 mg total) by mouth daily. Patient not taking: Reported on 08/26/2020 09/13/14   Delos Haring, PA-C      Allergies    Patient has no known allergies.    Review of Systems   Review of Systems  Constitutional:  Negative for diaphoresis and fever.  Eyes:  Negative for redness.  Respiratory:  Negative for cough and shortness of breath.   Cardiovascular:  Positive for chest pain (Burning) and palpitations. Negative for leg swelling.  Gastrointestinal:  Negative for abdominal pain, nausea and vomiting.  Genitourinary:  Negative for dysuria.  Musculoskeletal:  Negative for back pain and neck pain.  Skin:  Negative for rash.  Neurological:  Positive for weakness. Negative for syncope and light-headedness.  Psychiatric/Behavioral:  The patient is nervous/anxious.    Physical Exam Updated Vital Signs BP 126/78    Pulse 73    Temp 97.8 F (36.6 C)    Resp 16    SpO2 96%  Physical Exam Vitals and nursing note reviewed.  Constitutional:      General: She is not in acute distress.  Appearance: She is well-developed.  HENT:     Head: Normocephalic and atraumatic.     Right Ear: External ear normal.     Left Ear: External ear normal.     Nose: Nose normal.  Eyes:     Conjunctiva/sclera: Conjunctivae normal.  Cardiovascular:     Rate and Rhythm: Normal rate and regular rhythm.     Heart sounds: No murmur heard. Pulmonary:     Effort: No respiratory distress.     Breath sounds: No wheezing, rhonchi or rales.  Abdominal:     Palpations: Abdomen is soft.     Tenderness: There  is no abdominal tenderness. There is no guarding or rebound.  Musculoskeletal:     Cervical back: Normal range of motion and neck supple.     Right lower leg: No edema.     Left lower leg: No edema.  Skin:    General: Skin is warm and dry.     Findings: No rash.  Neurological:     General: No focal deficit present.     Mental Status: She is alert. Mental status is at baseline.     Motor: No weakness.  Psychiatric:        Mood and Affect: Mood is anxious.    ED Results / Procedures / Treatments   Labs (all labs ordered are listed, but only abnormal results are displayed) Labs Reviewed  COMPREHENSIVE METABOLIC PANEL - Abnormal; Notable for the following components:      Result Value   Glucose, Bld 129 (*)    Calcium 8.8 (*)    All other components within normal limits  I-STAT BETA HCG BLOOD, ED ([facility], [facility], [facility] ONLY) - Abnormal; Notable for the following components:   I-stat hCG, quantitative 9.7 (*)    All other components within normal limits  CBC WITH DIFFERENTIAL/PLATELET  TSH  URINALYSIS, ROUTINE W REFLEX MICROSCOPIC  TROPONIN I (HIGH SENSITIVITY)  TROPONIN I (HIGH SENSITIVITY)    EKG EKG Interpretation  Date/Time:  Wednesday May 26 2021 03:14:28 EST Ventricular Rate:  89 PR Interval:  144 QRS Duration: 80 QT Interval:  354 QTC Calculation: 430 R Axis:   97 Text Interpretation: Sinus rhythm with occasional Premature ventricular complexes Rightward axis Low voltage QRS Borderline ECG When compared with ECG of 25-Aug-2020 14:15,  rate is slower Confirmed by Aletta Edouard [phone]) on 05/26/2021 2:12:13 PM  Radiology DG Chest 2 View  Result Date: 05/26/2021 CLINICAL DATA:  Palpitations, shortness of breath, nausea, heartburn. EXAM: CHEST - 2 VIEW COMPARISON:  09/13/2014 FINDINGS: The heart size and mediastinal contours are within normal limits. No consolidation, effusion, or pneumothorax. Mild degenerative changes are noted in the thoracic spine. IMPRESSION: No active  cardiopulmonary disease. Electronically Signed   By: Brett Fairy M.D.   On: 05/26/2021 04:18    Procedures Procedures    Medications Ordered in ED Medications - No data to display  ED Course/ Medical Decision Making/ A&P    Patient seen and examined. Plan discussed with patient.   Labs: Triage labs reviewed.  Troponin negative x2.  Electrolytes are normal with exception of minimally low calcium at 8.8.  Her beta i-STAT hCG was mildly elevated, but no concern for pregnancy.  EKG reviewed showing right axis deviation, occasional PVCs.  Imaging: Chest x-ray was normal.  Medications/Fluids: None  Vital signs reviewed and are as follows: BP 126/78    Pulse 73    Temp 97.8 F (36.6 C)    Resp 16  SpO2 96%   Initial impression: Palpitations.  Will signs UA due to patient's report of intermittent hematuria.  She will need PCP and cardiology follow-up.  Referrals will be given.  She will need to have her beta-hCG rechecked in consider OB/GYN follow-up if persistently elevated.  4:05 PM On re-exam patient appears comfortable.  Discussed pertinent results with patient at bedside. This included UA and microscopy concerning for urinary tract infection. They are comfortable with discharge at this time.   Home treatment: Prescription written for Keflex.  Counseled on avoidance of caffeine, decongestant medication and other stimulants.  Follow-up: Encouraged patient to follow-up with their provider when able for recheck.  Cardiology referral given.  Encouraged return to ED with worsening chest pain, shortness of breath, trouble breathing, other concerns. Patient verbalized understanding and agreed with plan.                              Medical Decision Making  Patient here mainly for chest pain that is very atypical along with palpitations.  Work-up in the ED demonstrates normal troponin x2, EKG without signs of heart block, arrhythmia, prolonged QT, Brugada syndrome, WPW.  Chest x-ray  is clear.  TSH is normal.  Patient does have some anxiety about palpitations which is natural.  Encourage PCP/cardiology follow-up for further work-up.  No indications for admission today.  Occasional hematuria: UA suggestive of UTI.  No signs of pyelonephritis.  Will give Keflex, send urine culture.  Patient to follow-up with PCP when able.        Final Clinical Impression(s) / ED Diagnoses Final diagnoses:  Palpitations  Acute cystitis without hematuria    Rx / DC Orders ED Discharge Orders          Ordered    cephALEXin (KEFLEX) 500 MG capsule  3 times daily        05/26/21 1603              Carlisle Cater, PA-C 05/26/21 [place], [place], DO 05/26/21 2135

## 2021-05-26 NOTE — ED Provider Notes (Signed)
Emergency Medicine Provider Triage Evaluation Note  Victoria Hunt , a 56 y.o. female  was evaluated in triage.  Pt complains of palpitations.  She states that she has had this intermittently going on for a long time however it has recently become much more recurrent.  She feels like it is now near constant.  She states that she is now having episodes where she gets cold, clammy and diaphoretic.  She denies any medication changes.  No caffeine per her report.  She denies any current chest pain.  Review of Systems  Positive: Palpitations, chest pain that she describes as heartburn Negative: See above Physical Exam  BP (!) 182/93 (BP Location: Right Arm)    Pulse 93    Temp 98.2 F (36.8 C) (Oral)    Resp 18    SpO2 99%  Gen:   Awake, no distress   Resp:  Normal effort  MSK:   Moves extremities without difficulty  Other:  Normal speech.  Medical Decision Making  Medically screening exam initiated at 3:35 AM.  Appropriate orders placed.  Victoria Hunt was informed that the remainder of the evaluation will be completed by another provider, this initial triage assessment does not replace that evaluation, and the importance of remaining in the ED until their evaluation is complete.  Patient presents today for evaluation of worsening palpitations.  She does endorse what she describes as heartburn.  We will check labs including TSH, chest x-ray, EKG.  Note: Portions of this report may have been transcribed using voice recognition software. Every effort was made to ensure accuracy; however, inadvertent computerized transcription errors may be present    Victoria Hunt 05/26/21 [id number]    Fatima Blank, MD 05/28/21 [phone]

## 2021-05-26 NOTE — ED Notes (Signed)
Pt verbalized understanding of d/c instructions, meds and followup care. Denies questions. VSS, no distress noted. Steady gait to exit with all belongings.  ?

## 2021-05-26 NOTE — ED Triage Notes (Signed)
Patient reports palpitations /skipping beats onset yesterday , no chest pain or SOB .

## 2021-05-26 NOTE — Discharge Instructions (Signed)
Please read and follow all provided instructions.  Your diagnoses today include:  1. Palpitations   2. Acute cystitis without hematuria     Tests performed today include: An EKG of your heart: Shows occasional skipped beats, no signs of heart irritation A chest x-ray: Is normal Cardiac enzymes - a blood test for heart muscle damage Blood counts and electrolytes Thyroid stimulating hormone: Is normal Beta hCG for pregnancy: Mildly elevated, likely false positive, please have this rechecked by your doctor Urine test: Suggestive of urine infection Vital signs. See below for your results today.   Medications prescribed:  Keflex (cephalexin) - antibiotic  You have been prescribed an antibiotic medicine: take the entire course of medicine even if you are feeling better. Stopping early can cause the antibiotic not to work.  Take any prescribed medications only as directed.  Follow-up instructions: Please follow-up with your primary care provider when able and call cardiology referral for follow-up.  You may benefit from a heart monitor and further testing if cardiologist has any concerns.  Return instructions:  SEEK IMMEDIATE MEDICAL ATTENTION IF: You have severe chest pain, especially if the pain is crushing or pressure-like and spreads to the arms, back, neck, or jaw, or if you have sweating, nausea or vomiting, or trouble with breathing. THIS IS AN EMERGENCY. Do not wait to see if the pain will go away. Get medical help at once. Call 911. DO NOT drive yourself to the hospital.  Your chest pain gets worse and does not go away after a few minutes of rest.  You have an attack of chest pain lasting longer than what you usually experience.  You have significant dizziness, if you pass out, or have trouble walking.  You have chest pain not typical of your usual pain for which you originally saw your caregiver.  You have any other emergent concerns regarding your health.  Additional  Information: Chest pain comes from many different causes. Your caregiver has diagnosed you as having chest pain that is not specific for one problem, but does not require admission.  You are at low risk for an acute heart condition or other serious illness.   Your vital signs today were: BP (!) 145/89 (BP Location: Right Arm)    Pulse 75    Temp 97.8 F (36.6 C)    Resp 16    SpO2 97%  If your blood pressure (BP) was elevated above 135/85 this visit, please have this repeated by your doctor within one month. --------------

## 2021-05-28 LAB — URINE CULTURE

## 2021-06-04 ENCOUNTER — Other Ambulatory Visit: Payer: Self-pay

## 2021-06-04 ENCOUNTER — Ambulatory Visit (INDEPENDENT_AMBULATORY_CARE_PROVIDER_SITE_OTHER): Payer: No Typology Code available for payment source | Admitting: Internal Medicine

## 2021-06-04 ENCOUNTER — Ambulatory Visit (INDEPENDENT_AMBULATORY_CARE_PROVIDER_SITE_OTHER): Payer: No Typology Code available for payment source

## 2021-06-04 ENCOUNTER — Encounter: Payer: Self-pay | Admitting: Internal Medicine

## 2021-06-04 VITALS — BP 140/70 | HR 89 | Resp 20 | Ht 63.0 in | Wt 315.6 lb

## 2021-06-04 DIAGNOSIS — R002 Palpitations: Secondary | ICD-10-CM

## 2021-06-04 MED ORDER — PROPRANOLOL HCL 10 MG PO TABS: 10.0000 mg | ORAL_TABLET | Freq: Every day | ORAL | 2 refills | Status: DC

## 2021-06-04 NOTE — Progress Notes (Signed)
Cardiology Office Note:    Date:  06/04/2021   ID:  Victoria Hunt, DOB Sep 22, 1965, MRN [id number]  PCP:  Patient, No Pcp Per (Inactive)   [facility] Providers Cardiologist:  Janina Mayo, MD     Referring MD: No ref. provider found   No chief complaint on file. Palpitations  History of Present Illness:    Victoria Hunt is a 56 y.o. female with a hx of appendicitis, appendectomy, referral from the ED for palpitations  She notes that she had COVID twice. She had sporadic palpitations. Within the last 3-4 months with palpitations. She woke up at 2 AM and felt her heart was racing. She had nausea and heartburn. She walked around the house. Then she went to the ED 05/26/2021.She had a normal w/u in the ED. She has no arrhythmia.  She had sinus rhythm and some PVCs. TSH was normal. Her hgb normal. She was diagnosed with UTI and got Keflex. She feels better. She had some flank pain. She feels constant palpitations. Her EKG is normal. She denies syncope. She was LH with COVID and dizzy spell. She's not taking medications. She is a stress and anxiety eater.Her father has an aortic aneurysm. Mother is healthy. No family hx of arrhythmia. She has hyperlipidemia. She denies smoking. No etoh, no drug use. She drinks 6-8 oz of caffeine. Coffee. Sometimes sweat tea.   Past Medical History:  Diagnosis Date   Morbidly obese [facility])     Past Surgical History:  Procedure Laterality Date   CHOLECYSTECTOMY     LAPAROSCOPIC APPENDECTOMY N/A 08/25/2020   Procedure: APPENDECTOMY LAPAROSCOPIC;  Surgeon: Ralene Ok, MD;  Location: [facility] OR;  Service: General;  Laterality: N/A;    Current Medications: Current Meds  Medication Sig   propranolol (INDERAL) 10 MG tablet Take 1 tablet (10 mg total) by mouth daily.     Allergies:   Patient has no known allergies.   Social History   Socioeconomic History   Marital status: Single    Spouse name: Not on file   Number of children: Not on file    Years of education: Not on file   Highest education level: Not on file  Occupational History   Not on file  Tobacco Use   Smoking status: Never    Passive exposure: Yes   Smokeless tobacco: Not on file  Substance and Sexual Activity   Alcohol use: Not on file   Drug use: Not on file   Sexual activity: Not on file  Other Topics Concern   Not on file  Social History Narrative   Not on file   Social Determinants of Health   Financial Resource Strain: Not on file  Food Insecurity: Not on file  Transportation Needs: Not on file  Physical Activity: Not on file  Stress: Not on file  Social Connections: Not on file     Family History: The patient's per above family history is not on file.  ROS:   Please see the history of present illness.     All other systems reviewed and are negative.  EKGs/Labs/Other Studies Reviewed:    The following studies were reviewed today:   EKG:  EKG is  ordered today.  The ekg ordered today demonstrates  NSR, no delta wave, normal PR and QTc  Recent Labs: 05/26/2021: ALT 17; BUN 15; Creatinine, Ser 0.96; Hemoglobin 13.2; Platelets 301; Potassium 3.6; Sodium 136; TSH 2.217  Recent Lipid Panel No results found for: CHOL, TRIG, HDL,  CHOLHDL, VLDL, LDLCALC, LDLDIRECT   Risk Assessment/Calculations:           Physical Exam:    VS:  BP 140/70 (BP Location: Left Arm, Patient Position: Sitting, Cuff Size: Large)    Pulse 89    Resp 20    Ht 5\' 3"  (1.6 m)    Wt (!) 315 lb 9.6 oz (143.2 kg)    SpO2 97%    BMI 55.91 kg/m     Wt Readings from Last 3 Encounters:  06/04/21 (!) 315 lb 9.6 oz (143.2 kg)  08/25/20 (!) 310 lb (140.6 kg)  09/13/14 (!) 318 lb 7 oz (144.4 kg)     GEN: obese, Well nourished, well developed in no acute distress HEENT: Normal NECK: No JVD; No carotid bruits LYMPHATICS: No lymphadenopathy CARDIAC: RRR, no murmurs, rubs, gallops RESPIRATORY:  Clear to auscultation without rales, wheezing or rhonchi  ABDOMEN: Soft,  non-tender, non-distended MUSCULOSKELETAL:  No edema; No deformity  SKIN: Warm and dry NEUROLOGIC:  Alert and oriented x 3 PSYCHIATRIC:  Normal affect   ASSESSMENT:    #Palpitations She reports frequent palpitations.She does not have high risk features including syncope c/f arrhythmia , family hx of SCD, or abnormalities on her EKG. She does not have high caffeine intake. No obvious medication causes. Can start with low dose propanolol to see if this helps her symptoms. Will also monitor her rates on cardiac monitor.  PLAN:    In order of problems listed above:  Zio patch 7 days Propanolol 10 mg daily        Medication Adjustments/Labs and Tests Ordered: Current medicines are reviewed at length with the patient today.  Concerns regarding medicines are outlined above.  Orders Placed This Encounter  Procedures   LONG TERM MONITOR (3-14 DAYS)   EKG 12-Lead   Meds ordered this encounter  Medications   propranolol (INDERAL) 10 MG tablet    Sig: Take 1 tablet (10 mg total) by mouth daily.    Dispense:  30 tablet    Refill:  2    Patient Instructions  Medication Instructions:  START  Propranolol 10 mg daily *If you need a refill on your cardiac medications before your next appointment, please call your pharmacy*   Lab Work: NONE ordered at this time of appointment   If you have labs (blood work) drawn today and your tests are completely normal, you will receive your results only by: [place] (if you have MyChart) OR A paper copy in the mail If you have any lab test that is abnormal or we need to change your treatment, we will call you to review the results.   Testing/Procedures: Bryn Gulling- Long Term Monitor Instructions  Your physician has requested you wear a ZIO patch monitor for 7 days.  This is a single patch monitor. [employer] supplies one patch monitor per enrollment. Additional stickers are not available. Please do not apply patch if you will be having a  Nuclear Stress Test,  Echocardiogram, Cardiac CT, MRI, or Chest Xray during the period you would be wearing the  monitor. The patch cannot be worn during these tests. You cannot remove and re-apply the  ZIO XT patch monitor.  Your ZIO patch monitor will be mailed 3 day USPS to your address on file. It may take 3-5 days  to receive your monitor after you have been enrolled.  Once you have received your monitor, please review the enclosed instructions. Your monitor  has already been registered assigning  a specific monitor serial # to you.  Billing and Patient Assistance Program Information  We have supplied [employer] with any of your insurance information on file for billing purposes. [employer] offers a sliding scale Patient Assistance Program for patients that do not have  insurance, or whose insurance does not completely cover the cost of the ZIO monitor.  You must apply for the Patient Assistance Program to qualify for this discounted rate.  To apply, please call [employer] at [phone], select option 4, select option 2, ask to apply for  Patient Assistance Program. Theodore Demark will ask your household income, and how many people  are in your household. They will quote your out-of-pocket cost based on that information.  [employer] will also be able to set up a 66-month, interest-free payment plan if needed.  Applying the monitor   Shave hair from upper left chest.  Hold abrader disc by orange tab. Rub abrader in 40 strokes over the upper left chest as  indicated in your monitor instructions.  Clean area with 4 enclosed alcohol pads. Let dry.  Apply patch as indicated in monitor instructions. Patch will be placed under collarbone on left  side of chest with arrow pointing upward.  Rub patch adhesive wings for 2 minutes. Remove white label marked "1". Remove the white  label marked "2". Rub patch adhesive wings for 2 additional minutes.  While looking in a mirror, press and release button in center  of patch. A small green light will  flash 3-4 times. This will be your only indicator that the monitor has been turned on.  Do not shower for the first 24 hours. You may shower after the first 24 hours.  Press the button if you feel a symptom. You will hear a small click. Record Date, Time and  Symptom in the Patient Logbook.  When you are ready to remove the patch, follow instructions on the last 2 pages of Patient  Logbook. Stick patch monitor onto the last page of Patient Logbook.  Place Patient Logbook in the blue and white box. Use locking tab on box and tape box closed  securely. The blue and white box has prepaid postage on it. Please place it in the mailbox as  soon as possible. Your physician should have your test results approximately 7 days after the  monitor has been mailed back to [facility].  Call [place] at [phone] if you have questions regarding  your ZIO XT patch monitor. Call them immediately if you see an orange light blinking on your  monitor.  If your monitor falls off in less than 4 days, contact our Monitor department at [phone].  If your monitor becomes loose or falls off after 4 days call [employer] at [phone] for  suggestions on securing your monitor    Follow-Up: At [facility], you and your health needs are our priority.  As part of our continuing mission to provide you with exceptional heart care, we have created designated Provider Care Teams.  These Care Teams include your primary Cardiologist (physician) and Advanced Practice Providers (APPs -  Physician Assistants and Nurse Practitioners) who all work together to provide you with the care you need, when you need it.   Your next appointment:   As Needed    The format for your next appointment:   In Person  Provider:   Janina Mayo, MD    Other Instructions     Signed, Janina Mayo, MD  06/04/2021 6:00 PM  [facility]

## 2021-06-04 NOTE — Patient Instructions (Addendum)
Medication Instructions:  START  Propranolol 10 mg daily *If you need a refill on your cardiac medications before your next appointment, please call your pharmacy*   Lab Work: NONE ordered at this time of appointment   If you have labs (blood work) drawn today and your tests are completely normal, you will receive your results only by: [place] (if you have MyChart) OR A paper copy in the mail If you have any lab test that is abnormal or we need to change your treatment, we will call you to review the results.   Testing/Procedures: Bryn Gulling- Long Term Monitor Instructions  Your physician has requested you wear a ZIO patch monitor for 7 days.  This is a single patch monitor. [employer] supplies one patch monitor per enrollment. Additional stickers are not available. Please do not apply patch if you will be having a Nuclear Stress Test,  Echocardiogram, Cardiac CT, MRI, or Chest Xray during the period you would be wearing the  monitor. The patch cannot be worn during these tests. You cannot remove and re-apply the  ZIO XT patch monitor.  Your ZIO patch monitor will be mailed 3 day USPS to your address on file. It may take 3-5 days  to receive your monitor after you have been enrolled.  Once you have received your monitor, please review the enclosed instructions. Your monitor  has already been registered assigning a specific monitor serial # to you.  Billing and Patient Assistance Program Information  We have supplied [employer] with any of your insurance information on file for billing purposes. [employer] offers a sliding scale Patient Assistance Program for patients that do not have  insurance, or whose insurance does not completely cover the cost of the ZIO monitor.  You must apply for the Patient Assistance Program to qualify for this discounted rate.  To apply, please call [employer] at [phone], select option 4, select option 2, ask to apply for  Patient Assistance Program.  Theodore Demark will ask your household income, and how many people  are in your household. They will quote your out-of-pocket cost based on that information.  [employer] will also be able to set up a 67-month, interest-free payment plan if needed.  Applying the monitor   Shave hair from upper left chest.  Hold abrader disc by orange tab. Rub abrader in 40 strokes over the upper left chest as  indicated in your monitor instructions.  Clean area with 4 enclosed alcohol pads. Let dry.  Apply patch as indicated in monitor instructions. Patch will be placed under collarbone on left  side of chest with arrow pointing upward.  Rub patch adhesive wings for 2 minutes. Remove white label marked "1". Remove the white  label marked "2". Rub patch adhesive wings for 2 additional minutes.  While looking in a mirror, press and release button in center of patch. A small green light will  flash 3-4 times. This will be your only indicator that the monitor has been turned on.  Do not shower for the first 24 hours. You may shower after the first 24 hours.  Press the button if you feel a symptom. You will hear a small click. Record Date, Time and  Symptom in the Patient Logbook.  When you are ready to remove the patch, follow instructions on the last 2 pages of Patient  Logbook. Stick patch monitor onto the last page of Patient Logbook.  Place Patient Logbook in the blue and white box. Use locking tab on box  and tape box closed  securely. The blue and white box has prepaid postage on it. Please place it in the mailbox as  soon as possible. Your physician should have your test results approximately 7 days after the  monitor has been mailed back to [facility].  Call [place] at [phone] if you have questions regarding  your ZIO XT patch monitor. Call them immediately if you see an orange light blinking on your  monitor.  If your monitor falls off in less than 4 days, contact our Monitor  department at [phone].  If your monitor becomes loose or falls off after 4 days call [employer] at [phone] for  suggestions on securing your monitor    Follow-Up: At [facility], you and your health needs are our priority.  As part of our continuing mission to provide you with exceptional heart care, we have created designated Provider Care Teams.  These Care Teams include your primary Cardiologist (physician) and Advanced Practice Providers (APPs -  Physician Assistants and Nurse Practitioners) who all work together to provide you with the care you need, when you need it.   Your next appointment:   As Needed    The format for your next appointment:   In Person  Provider:   Janina Mayo, MD    Other Instructions

## 2021-06-04 NOTE — Progress Notes (Unsigned)
Enrolled patient for a 7 day ZIo XT monitor to be mailed to patients home

## 2021-06-09 DIAGNOSIS — R002 Palpitations: Secondary | ICD-10-CM | POA: Diagnosis not present

## 2021-06-13 ENCOUNTER — Other Ambulatory Visit: Payer: Self-pay

## 2021-06-13 ENCOUNTER — Ambulatory Visit (HOSPITAL_COMMUNITY)
Admission: EM | Admit: 2021-06-13 | Discharge: 2021-06-13 | Disposition: A | Discharge status: Home / Self Care | Payer: No Typology Code available for payment source | Attending: Emergency Medicine | Admitting: Emergency Medicine

## 2021-06-13 ENCOUNTER — Encounter (HOSPITAL_COMMUNITY): Payer: Self-pay

## 2021-06-13 ENCOUNTER — Emergency Department (HOSPITAL_COMMUNITY)
Admission: EM | Admit: 2021-06-13 | Discharge: 2021-06-13 | Disposition: A | Discharge status: Home / Self Care | Payer: No Typology Code available for payment source | Attending: Emergency Medicine | Admitting: Emergency Medicine

## 2021-06-13 ENCOUNTER — Emergency Department (HOSPITAL_COMMUNITY): Payer: No Typology Code available for payment source

## 2021-06-13 ENCOUNTER — Encounter (HOSPITAL_COMMUNITY): Payer: Self-pay | Admitting: Emergency Medicine

## 2021-06-13 DIAGNOSIS — R0602 Shortness of breath: Secondary | ICD-10-CM | POA: Diagnosis not present

## 2021-06-13 DIAGNOSIS — R002 Palpitations: Secondary | ICD-10-CM | POA: Diagnosis not present

## 2021-06-13 DIAGNOSIS — R1013 Epigastric pain: Secondary | ICD-10-CM | POA: Diagnosis not present

## 2021-06-13 DIAGNOSIS — R9431 Abnormal electrocardiogram [ECG] [EKG]: Secondary | ICD-10-CM | POA: Diagnosis not present

## 2021-06-13 DIAGNOSIS — R000 Tachycardia, unspecified: Secondary | ICD-10-CM | POA: Diagnosis not present

## 2021-06-13 DIAGNOSIS — I10 Essential (primary) hypertension: Secondary | ICD-10-CM | POA: Diagnosis not present

## 2021-06-13 LAB — CBC WITH DIFFERENTIAL/PLATELET
Abs Immature Granulocytes: 0.02 10*3/uL (ref 0.00–0.07)
Basophils Absolute: 0.1 10*3/uL (ref 0.0–0.1)
Basophils Relative: 1 %
Eosinophils Absolute: 0.1 10*3/uL (ref 0.0–0.5)
Eosinophils Relative: 1 %
HCT: 44.7 % (ref 36.0–46.0)
Hemoglobin: 14.2 g/dL (ref 12.0–15.0)
Immature Granulocytes: 0 %
Lymphocytes Relative: 26 %
Lymphs Abs: 2.1 10*3/uL (ref 0.7–4.0)
MCH: 28.5 pg (ref 26.0–34.0)
MCHC: 31.8 g/dL (ref 30.0–36.0)
MCV: 89.6 fL (ref 80.0–100.0)
Monocytes Absolute: 0.5 10*3/uL (ref 0.1–1.0)
Monocytes Relative: 7 %
Neutro Abs: 5.4 10*3/uL (ref 1.7–7.7)
Neutrophils Relative %: 65 %
Platelets: 277 10*3/uL (ref 150–400)
RBC: 4.99 MIL/uL (ref 3.87–5.11)
RDW: 13.2 % (ref 11.5–15.5)
WBC: 8.2 10*3/uL (ref 4.0–10.5)
nRBC: 0 % (ref 0.0–0.2)

## 2021-06-13 LAB — COMPREHENSIVE METABOLIC PANEL
ALT: 17 U/L (ref 0–44)
AST: 20 U/L (ref 15–41)
Albumin: 3.7 g/dL (ref 3.5–5.0)
Alkaline Phosphatase: 71 U/L (ref 38–126)
Anion gap: 12 (ref 5–15)
BUN: 14 mg/dL (ref 6–20)
CO2: 22 mmol/L (ref 22–32)
Calcium: 8.7 mg/dL — ABNORMAL LOW (ref 8.9–10.3)
Chloride: 103 mmol/L (ref 98–111)
Creatinine, Ser: 0.84 mg/dL (ref 0.44–1.00)
GFR, Estimated: >60 mL/min (ref >60)
Glucose, Bld: 103 mg/dL — ABNORMAL HIGH (ref 70–99)
Potassium: 4 mmol/L (ref 3.5–5.1)
Sodium: 137 mmol/L (ref 135–145)
Total Bilirubin: 0.5 mg/dL (ref 0.3–1.2)
Total Protein: 6.7 g/dL (ref 6.5–8.1)

## 2021-06-13 LAB — URINALYSIS, ROUTINE W REFLEX MICROSCOPIC
Bacteria, UA: NONE SEEN
Bilirubin Urine: NEGATIVE
Glucose, UA: NEGATIVE mg/dL
Ketones, ur: NEGATIVE mg/dL
Leukocytes,Ua: NEGATIVE
Nitrite: NEGATIVE
Protein, ur: NEGATIVE mg/dL
Specific Gravity, Urine: 1.011 (ref 1.005–1.030)
pH: 6 (ref 5.0–8.0)

## 2021-06-13 LAB — LIPASE, BLOOD: Lipase: 29 U/L (ref 11–51)

## 2021-06-13 MED ORDER — ASPIRIN 81 MG PO CHEW
CHEWABLE_TABLET | ORAL | Status: AC
  Filled 2021-06-13: qty 4

## 2021-06-13 MED ORDER — IOHEXOL 300 MG/ML  SOLN
100.0000 mL | Freq: Once | INTRAMUSCULAR | Status: AC | PRN
  Administered 2021-06-13: 100 mL via INTRAVENOUS

## 2021-06-13 MED ORDER — ASPIRIN 81 MG PO CHEW
324.0000 mg | CHEWABLE_TABLET | Freq: Once | ORAL | Status: AC
  Administered 2021-06-13: 243 mg via ORAL

## 2021-06-13 NOTE — ED Notes (Signed)
Went to ED on 05/26/20 for weakness and palpations, wearing heart monitor for symptoms.   Patients states she feel like the "spells" are coming more frequent.

## 2021-06-13 NOTE — ED Notes (Signed)
[facility] here for transport to ED.

## 2021-06-13 NOTE — ED Provider Notes (Signed)
[facility] EMERGENCY DEPARTMENT Provider Note   CSN: [id number] Arrival date & time: 06/13/21  1610     History  Chief Complaint  Patient presents with   Abdominal Pain    Victoria Hunt is a 56 y.o. female.  HPI She presents for evaluation of palpitations, shortness of breath and rapid heartbeat.  She was seen briefly at an urgent care, felt to be having a heart attack so sent to the emergency department for evaluation and treatment.  She saw cardiology, on 06/04/2021 to be evaluated for palpitations.  They placed a Zio patch, and started her on atenolol, 10 mg a day to treat rapid heartbeat.  She has had persistent palpitations for many months, getting worse.  She is also having trouble eating and has noticed early satiety.  She denies vomiting, fever, chills, cough.  She does not have a history of high blood pressure.  She does have a prior cholecystectomy and appendectomy.  She is worried about her father having had aortic aneurysm  Home Medications Prior to Admission medications   Medication Sig Start Date End Date Taking? Authorizing Provider  acetaminophen (TYLENOL) 500 MG tablet Take 1 tablet (500 mg total) by mouth every 6 (six) hours as needed for mild pain or fever. Patient taking differently: Take 500-1,000 mg by mouth every 6 (six) hours as needed for mild pain, headache or fever. 08/26/20   Norm Parcel, PA-C  ibuprofen (ADVIL) 200 MG tablet Take 200 mg by mouth every 6 (six) hours as needed for mild pain, headache or fever.    [provider]  propranolol (INDERAL) 10 MG tablet Take 1 tablet (10 mg total) by mouth daily. 06/04/21 09/02/21  Janina Mayo, MD      Allergies    Patient has no known allergies.    Review of Systems   Review of Systems  All other systems reviewed and are negative.  Physical Exam Updated Vital Signs BP 118/73    Pulse 81    Temp 97.9 F (36.6 C) (Oral)    Resp 15    Ht 5\' 3"  (1.6 m)    Wt (!) 138.3 kg     SpO2 98%    BMI 54.03 kg/m  Physical Exam Vitals and nursing note reviewed.  Constitutional:      General: She is not in acute distress.    Appearance: She is well-developed. She is obese. She is not ill-appearing, toxic-appearing or diaphoretic.  HENT:     Head: Normocephalic and atraumatic.     Right Ear: External ear normal.     Left Ear: External ear normal.     Nose: No congestion or rhinorrhea.     Mouth/Throat:     Pharynx: No oropharyngeal exudate or posterior oropharyngeal erythema.  Eyes:     Conjunctiva/sclera: Conjunctivae normal.     Pupils: Pupils are equal, round, and reactive to light.  Neck:     Trachea: Phonation normal.  Cardiovascular:     Rate and Rhythm: Normal rate and regular rhythm.     Heart sounds: Normal heart sounds.  Pulmonary:     Effort: Pulmonary effort is normal.     Breath sounds: Normal breath sounds.  Abdominal:     General: There is no distension.     Palpations: Abdomen is soft.     Tenderness: There is abdominal tenderness (Epigastric, mild).  Musculoskeletal:        General: Normal range of motion.  Cervical back: Normal range of motion and neck supple.  Skin:    General: Skin is warm and dry.  Neurological:     Mental Status: She is alert and oriented to person, place, and time.     Cranial Nerves: No cranial nerve deficit.     Sensory: No sensory deficit.     Motor: No abnormal muscle tone.     Coordination: Coordination normal.  Psychiatric:        Mood and Affect: Mood normal.        Behavior: Behavior normal.        Thought Content: Thought content normal.        Judgment: Judgment normal.    ED Results / Procedures / Treatments   Labs (all labs ordered are listed, but only abnormal results are displayed) Labs Reviewed  COMPREHENSIVE METABOLIC PANEL - Abnormal; Notable for the following components:      Result Value   Glucose, Bld 103 (*)    Calcium 8.7 (*)    All other components within normal limits  LIPASE,  BLOOD  CBC WITH DIFFERENTIAL/PLATELET  URINALYSIS, ROUTINE W REFLEX MICROSCOPIC    EKG EKG Interpretation  Date/Time:  Sunday June 13 2021 16:22:35 EST Ventricular Rate:  100 PR Interval:  140 QRS Duration: 102 QT Interval:  335 QTC Calculation: 432 R Axis:   119 Text Interpretation: Sinus tachycardia Right axis deviation Since last tracing of earlier today No significant change was found Confirmed by Daleen Bo [phone] on 06/13/2021 5:56:56 PM  Radiology CT Abdomen Pelvis W Contrast  Result Date: 06/13/2021 CLINICAL DATA:  Hervey Ard stomach pain with nausea EXAM: CT ABDOMEN AND PELVIS WITH CONTRAST TECHNIQUE: Multidetector CT imaging of the abdomen and pelvis was performed using the standard protocol following bolus administration of intravenous contrast. RADIATION DOSE REDUCTION: This exam was performed according to the departmental dose-optimization program which includes automated exposure control, adjustment of the mA and/or kV according to patient size and/or use of iterative reconstruction technique. CONTRAST:  113mL OMNIPAQUE IOHEXOL 300 MG/ML  SOLN COMPARISON:  CT 08/25/2020 FINDINGS: Lower chest: Lung bases demonstrate no acute consolidation or effusion. Normal cardiac size. Hepatobiliary: No focal liver abnormality is seen. Status post cholecystectomy. No biliary dilatation. Pancreas: Possible subtle fat stranding at the pancreatic head. No ductal dilatation Spleen: Normal in size without focal abnormality. Adrenals/Urinary Tract: Adrenal glands are normal. Subcentimeter hypodensity mid right kidney. No hydronephrosis. The bladder is unremarkable. Stomach/Bowel: Stomach is within normal limits. Interval appendectomy. No evidence of bowel wall thickening, distention, or inflammatory changes. Vascular/Lymphatic: No significant vascular findings are present. No enlarged abdominal or pelvic lymph nodes. Reproductive: Prominent endometrial stripe up to 24 mm. Possible left fundal fibroid.  No adnexal mass Other: Negative for pelvic effusion or free air. Small fat containing umbilical hernia Musculoskeletal: Degenerative changes at L5-S1 IMPRESSION: 1. Possible subtle inflammatory changes involving the pancreatic head as may be seen with mild or early pancreatitis. Suggest correlation with enzymes. 2. Otherwise no CT evidence for acute intra-abdominal or pelvic abnormality. 3. Heterogenous endometrial stripe thickening up to 24 mm, recommend correlation with pelvic ultrasound which may be performed on a nonemergent basis. Electronically Signed   By: Donavan Foil M.D.   On: 06/13/2021 19:15    Procedures Procedures    Medications Ordered in ED Medications  iohexol (OMNIPAQUE) 300 MG/ML solution 100 mL (100 mLs Intravenous Contrast Given 06/13/21 1903)    ED Course/ Medical Decision Making/ A&P  Medical Decision Making Patient presenting with nonspecific symptoms, ongoing and worsening, both thoracic and abdominal.  She is being followed closely by cardiology with placement of a Zio patch which she currently is wearing for another few days.  Amount and/or Complexity of Data Reviewed External Data Reviewed: labs, radiology, ECG and notes.    Details: Recent labs, this month including CBC, metabolic panel and chest x-ray were normal.  Notes from cardiology evaluation reviewed. Labs: ordered.    Details: CBC, metabolic panel, lipase and urinalysis-normal findings Radiology: ordered and independent interpretation performed.    Details: CT abdomen pelvis.  Possible mild inflammation of the increase.  No other acute abnormalities.  Attention to uterus showing endometrial stripe, likely related to current menstrual flow. ECG/medicine tests: ordered and independent interpretation performed.    Details: Normal sinus rhythm  Risk Prescription drug management. Risk Details: Screening evaluation done for nonspecific symptoms including cardiac and intra-abdominal.   Doubt ACS, significant metabolic abnormality or sepsis.           Final Clinical Impression(s) / ED Diagnoses Final diagnoses:  Palpitations  Epigastric pain    Rx / DC Orders ED Discharge Orders     None         Daleen Bo, MD 06/17/21 [phone]

## 2021-06-13 NOTE — Discharge Instructions (Signed)
The testing does not show any severe problems.  Follow-up with your cardiologist as planned to check on the palpitations.  Make sure you are eating and drinking regularly.  Consider following up with a gynecologist about your menstrual irregularities.  Return here, if needed.

## 2021-06-13 NOTE — ED Notes (Signed)
Report called to Roselyn Reef, ED Charge RN.

## 2021-06-13 NOTE — ED Provider Notes (Signed)
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  Blood pressure on arrival was 198/122.  Patient states she was advised to discontinue all of her blood pressure medications because her cardiologist has her doing Holter monitor testing at this time.  Patient is in obvious distress at this time.  Patient advised that we will transport her via [facility] to the emergency room.  Patient verbalized understanding.  Patient was given ASA 324 mg.  IV started.   Lynden Oxford Scales, PA-C 06/13/21 1553

## 2021-06-13 NOTE — ED Triage Notes (Signed)
Pt c/o upper and lower abd pains but denies pains at this time of triage.    Pt reports that shes had palpitations, so cardiologist has on medications for it but since wearing heart monitor had pt to stop taking medication so patient states that she has been having palpitations while wearing the monitor. Pt reports that she gets to take off monitor Wed.

## 2021-06-13 NOTE — ED Notes (Addendum)
Patient is being discharged from the Urgent Care and sent to the Emergency Department via [facility] . Per Lynden Oxford, PA, patient is in need of higher level of care due to hypertension, dizzy spells, and epigastric pain. Patient is aware and verbalizes understanding of plan of care.  Vitals:   06/13/21 1533  BP: (!) 198/122  Pulse: (!) 104  Resp: 20  Temp: 98.9 F (37.2 C)  SpO2: 98%

## 2021-06-13 NOTE — ED Triage Notes (Signed)
Pt reports having palpitations, SOB, and pt reports increased HR. Sharp pain in her stomach with nausea. Started today.  Pt reports an episode on Friday that was similar.

## 2021-06-13 NOTE — ED Notes (Signed)
[facility] notified of need for transport.

## 2021-06-16 LAB — RESULTS CONSOLE HPV: CHL HPV: NEGATIVE

## 2021-06-16 LAB — HM PAP SMEAR

## 2021-06-17 ENCOUNTER — Telehealth: Payer: Self-pay | Admitting: Internal Medicine

## 2021-06-17 ENCOUNTER — Telehealth: Payer: Self-pay

## 2021-06-17 NOTE — Telephone Encounter (Signed)
Victoria Hunt, you can add patient to the schedule tomorrow there are openings. Thanks

## 2021-06-17 NOTE — Telephone Encounter (Signed)
Returned call to pt. No answer, left msg to call back. °

## 2021-06-17 NOTE — Telephone Encounter (Signed)
Pt c/o BP issue: STAT if pt c/o blurred vision, one-sided weakness or slurred speech  1. What are your last 5 BP readings?  Ranging 150/110 (last reading) when it spikes has had 3-4 episodes lasting an hour or throughout the day   2. Are you having any other symptoms (ex. Dizziness, headache, blurred vision, passed out)? Fatigue   3. What is your BP issue? Hypertension. Last episode occurred at 4:30 AM.  Patient has been scheduled to see Dr. Harl Bowie in regards to this on 02/01.

## 2021-06-17 NOTE — Telephone Encounter (Signed)
Pt states that she woke up at 4:30am and was feeling very weak so she took her BP and it was noted to be 150/110. Pt states that when she has these episodes happen she starts to feel very weak and her BP is elevated. She did feel lightheaded during this episode. Pt states that this has been going on for the last 7-8 day. Current BP is 141/103, Hr 89. She states that she is tired and has been since this morning. Pt states that she did take her propranolol at 4:45 am. She reports having some palpitations but not many. Please advise.

## 2021-06-17 NOTE — Telephone Encounter (Signed)
Left patient vm to call back to schedule.  °

## 2021-06-17 NOTE — Telephone Encounter (Signed)
Patient states she was seen in the ED yesterday and was told to try to get in sooner with provider to get a referral to Gastroenterology.  Can we work patient in sooner? Patient is scheduled 2/22.

## 2021-06-18 ENCOUNTER — Ambulatory Visit: Payer: No Typology Code available for payment source | Admitting: Physician Assistant

## 2021-06-18 ENCOUNTER — Other Ambulatory Visit: Payer: Self-pay

## 2021-06-18 ENCOUNTER — Encounter: Payer: Self-pay | Admitting: Physician Assistant

## 2021-06-18 VITALS — BP 156/100 | HR 74 | Temp 97.7°F | Ht 63.0 in | Wt 298.4 lb

## 2021-06-18 DIAGNOSIS — R7309 Other abnormal glucose: Secondary | ICD-10-CM | POA: Diagnosis not present

## 2021-06-18 DIAGNOSIS — H539 Unspecified visual disturbance: Secondary | ICD-10-CM

## 2021-06-18 DIAGNOSIS — R1013 Epigastric pain: Secondary | ICD-10-CM | POA: Diagnosis not present

## 2021-06-18 DIAGNOSIS — Z114 Encounter for screening for human immunodeficiency virus [HIV]: Secondary | ICD-10-CM

## 2021-06-18 DIAGNOSIS — N950 Postmenopausal bleeding: Secondary | ICD-10-CM | POA: Insufficient documentation

## 2021-06-18 DIAGNOSIS — Z1159 Encounter for screening for other viral diseases: Secondary | ICD-10-CM

## 2021-06-18 DIAGNOSIS — Q453 Other congenital malformations of pancreas and pancreatic duct: Secondary | ICD-10-CM | POA: Diagnosis not present

## 2021-06-18 DIAGNOSIS — R109 Unspecified abdominal pain: Secondary | ICD-10-CM

## 2021-06-18 DIAGNOSIS — R002 Palpitations: Secondary | ICD-10-CM

## 2021-06-18 DIAGNOSIS — E669 Obesity, unspecified: Secondary | ICD-10-CM | POA: Insufficient documentation

## 2021-06-18 LAB — LIPID PANEL
Cholesterol: 176 mg/dL (ref 0–200)
HDL: 39 mg/dL — ABNORMAL LOW (ref >39.00)
LDL Cholesterol: 101 mg/dL — ABNORMAL HIGH (ref 0–99)
NonHDL: 137.27
Total CHOL/HDL Ratio: 5
Triglycerides: 182 mg/dL — ABNORMAL HIGH (ref 0.0–149.0)
VLDL: 36.4 mg/dL (ref 0.0–40.0)

## 2021-06-18 LAB — H. PYLORI ANTIBODY, IGG: H Pylori IgG: NEGATIVE

## 2021-06-18 LAB — LIPASE: Lipase: 17 U/L (ref 11.0–59.0)

## 2021-06-18 LAB — AMYLASE: Amylase: 26 U/L — ABNORMAL LOW (ref 27–131)

## 2021-06-18 MED ORDER — SUCRALFATE 1 G PO TABS: 1.0000 g | ORAL_TABLET | Freq: Three times a day (TID) | ORAL | 0 refills | Status: DC

## 2021-06-18 MED ORDER — PANTOPRAZOLE SODIUM 40 MG PO TBEC: 40.0000 mg | DELAYED_RELEASE_TABLET | Freq: Every day | ORAL | 1 refills | Status: DC

## 2021-06-18 MED ORDER — ONDANSETRON HCL 4 MG PO TABS: 4.0000 mg | ORAL_TABLET | Freq: Three times a day (TID) | ORAL | 0 refills | Status: DC | PRN

## 2021-06-18 NOTE — Patient Instructions (Signed)
It was great to see you!  Referral placed for the eye doctor and the gastroenterologist  Start protonix 40 mg daily to reduce acid of stomach  Start carafate 1 gram 4 times daily to help heal your stomach  We will update blood work today to look for other possible causes of your stomach issues in the meantime  If any new/worsening symptoms, call our office  Let's follow-up in 3 months, sooner if you have concerns.  If a referral was placed today, you will be contacted for an appointment. Please note that routine referrals can sometimes take up to 3-4 weeks to process. Please call our office if you haven't heard anything after this time frame.  Take care,  [name] PA-C

## 2021-06-18 NOTE — Progress Notes (Signed)
Victoria Hunt is a 56 y.o. female here to establish care.  History of Present Illness:   Chief Complaint  Patient presents with   Establish Care   f/u ED visit    Pt was seen in the ED on 1/22 for HTN and palpitations.   Anxiety    Acute Concerns: Epigastric Pain During ED visit on 06/13/21, pt reported she had been experiencing epigastric pain for a couple of months, gradually worsening. She reported that she had recently been having trouble eating and noticed early satiety. Pt does admit she is a stress and anxiety-eater and has been under increased stress due to father's aortic aneurysm. Pt has a hx of appendectomy which was completed with no complications.   CT abdomen and pelvic w/contrast in ED found findings were possible subtle inflammatory changes involving the pancreatic head, which can be seen with mild/early pancreatitis.   Currently she states she is still experiencing discomfort when it comes to eating or even drinking water. Pain has remained consistent but she has lost about 5 pounds due to lack of eating. She has not trialed any medication to provide relief of sx, but is interested in trialing something. Denies vomiting, diarrhea, recent illness, or intake of foods that cause sensitivity.   Has never had a screening colonoscopy.   R flank pain Has been dealing with R flank pain ever since her appendectomy. She has had a confirmed UTI since that time. Has not had any confirmation of what could be causing this pain.  She does not have a gallbladder. Denies blood in urine or prior hx of kidney stones.    Postmenopausal Bleeding For the past year, Neoma has noticed that her menstrual cycle has begun to slow down, only occurring once every couple of months rather than monthly which is her baseline. Due to her age, she believed she was in the early stages of menopause but never got this confirmation from her GYN. Although she was experiencing irregular periods and an  intermittent cramping sensation throughout the year, she had not experienced normal menopausal sx including hot flashes, mood swings, night sweats, or vaginal dryness.    It wasn't until the discovery of "prominent endometrial stripe up to 24 mm and possible left fundal fibroid" following a CT Abdomen and pelvic that she decided to follow up with her gynecologist. She went to the ob-gyn recently and had a endometrial bx done. Results pending.    Possible Cataract  Ms. Lukasiewicz reports she has recently noticed what she believes to be a cataract in her right eye. According to pt, a co-worker of hers noticed a abnormal whiteness to her eye just below her pupil and Lexey also noticed changes in her vision. States she is not able to see out of her right eye as well. At this time she is interested in receiving a referral to an ophthalmologist.     Palpitations On 05/26/21, Gregary Signs presented to the ED with c/o  intermittent heart palpitations that have been occurring for a year, recently becoming more frequent and intense. During this visit she reported feeling a racing sensation in her chest that was accompanied by a burning then cold sensation. She also described becoming generally weak after these episodes. Due to an unusually long and severe episode that occurred that day, she decided to have this evaluated especially due to fhx of heart disease. Denies excessive caffeine intake, lightheadedness, syncope, vomiting, or recent illness.   After further examination, her troponins levels and EKG  were found to be normal. Due to this she was encouraged to follow up with cardiology for further evaluation as well as her PCP. Shortly after this pt did follow up with Dr. Harl Bowie, cardiology on 06/04/21. She was prescribed propanolol 10 mg daily and requested to wear a zio patch x 7 days to evaluate her palpitations.   Although Othell was compliant with her medication, she was still experiencing concerning palpitations  and sudden onset epigastric pain.  These episodes became so intoelrable that she returned to the ED on 06/13/21, with c/o SOB and rapid heartbeat. She initially visited the UC for this issue but due to their beliefs that she was having a heart attack she was sent to the ED for evaluation and treatment. Since all tests including blood work and EKG were normal, she was released with the recommendation to follow up with cardiology and monitor sx for any changes.   Depression screen [facility] 2/9 06/18/2021  Decreased Interest 0  Down, Depressed, Hopeless 0  PHQ - 2 Score 0    GAD 7 : Generalized Anxiety Score 06/18/2021  Nervous, Anxious, on Edge 3  Control/stop worrying 2  Worry too much - different things 2  Trouble relaxing 2  Restless 2  Easily annoyed or irritable 2  Afraid - awful might happen 2  Total GAD 7 Score 15  Anxiety Difficulty Very difficult   Elevated glucose Found on blood work done at ED. No prior hx of DM or pre-diabetes per patient report.  Other providers/specialists: Patient Care Team: [name], [place] as PCP - General (Physician Assistant) Janina Mayo, MD as PCP - Cardiology (Cardiology)   Past Medical History:  Diagnosis Date   Anxiety 04/22/2021   Cataract    possible   Hypertension 08/25/2020   Morbidly obese ([place])      Social History   Tobacco Use   Smoking status: Never    Passive exposure: Yes   Smokeless tobacco: Never  Vaping Use   Vaping Use: Never used  Substance Use Topics   Alcohol use: Not Currently   Drug use: Never    Past Surgical History:  Procedure Laterality Date   APPENDECTOMY  August 25, 2020   CHOLECYSTECTOMY     LAPAROSCOPIC APPENDECTOMY N/A 08/25/2020   Procedure: APPENDECTOMY LAPAROSCOPIC;  Surgeon: Ralene Ok, MD;  Location: [facility];  Service: General;  Laterality: N/A;    Family History  Problem Relation Age of Onset   Hyperlipidemia Mother    Depression Father    Heart disease Father     Heart attack Father    Aortic aneurysm Father    Bipolar disorder Father    CVA Maternal Grandmother    CVA Maternal Grandfather    Depression Paternal Grandmother    Myasthenia gravis Paternal Grandfather    Cancer Paternal Great-grandmother        uterine or cervical   Breast cancer Neg Hx    Colon cancer Neg Hx     No Known Allergies   Current Medications:   Current Outpatient Medications:    acetaminophen (TYLENOL) 500 MG tablet, Take 1 tablet (500 mg total) by mouth every 6 (six) hours as needed for mild pain or fever. (Patient taking differently: Take 500-1,000 mg by mouth every 6 (six) hours as needed for mild pain, headache or fever.), Disp: , Rfl:    ibuprofen (ADVIL) 200 MG tablet, Take 200 mg by mouth every 6 (six) hours as needed for mild pain, headache or fever., Disp: ,  Rfl:    ondansetron (ZOFRAN) 4 MG tablet, Take 1 tablet (4 mg total) by mouth every 8 (eight) hours as needed for nausea or vomiting., Disp: 30 tablet, Rfl: 0   pantoprazole (PROTONIX) 40 MG tablet, Take 1 tablet (40 mg total) by mouth daily., Disp: 30 tablet, Rfl: 1   propranolol (INDERAL) 10 MG tablet, Take 1 tablet (10 mg total) by mouth daily., Disp: 30 tablet, Rfl: 2   sucralfate (CARAFATE) 1 g tablet, Take 1 tablet (1 g total) by mouth 4 (four) times daily -  with meals and at bedtime for 14 days., Disp: 56 tablet, Rfl: 0   Review of Systems:   ROS Negative unless otherwise specified per HPI.  Vitals:   Vitals:   06/18/21 0923  BP: (!) 156/100  Pulse: 74  Temp: 97.7 F (36.5 C)  TempSrc: Temporal  SpO2: 97%  Weight: 298 lb 6.1 oz (135.3 kg)  Height: 5\' 3"  (1.6 m)      Body mass index is 52.86 kg/m.  Physical Exam:   Physical Exam Vitals and nursing note reviewed.  Constitutional:      General: She is not in acute distress.    Appearance: She is well-developed. She is not ill-appearing or toxic-appearing.  Eyes:     Comments: R pupil cloudy  Cardiovascular:      Rate and Rhythm: Normal rate and regular rhythm.     Pulses: Normal pulses.     Heart sounds: Normal heart sounds, S1 normal and S2 normal.  Pulmonary:     Effort: Pulmonary effort is normal.     Breath sounds: Normal breath sounds.  Abdominal:     General: Abdomen is flat.     Palpations: Abdomen is soft.     Tenderness: There is abdominal tenderness in the epigastric area. There is no right CVA tenderness, left CVA tenderness, guarding or rebound.  Skin:    General: Skin is warm and dry.  Neurological:     Mental Status: She is alert.     GCS: GCS eye subscore is 4. GCS verbal subscore is 5. GCS motor subscore is 6.  Psychiatric:        Speech: Speech normal.        Behavior: Behavior normal. Behavior is cooperative.    Assessment and Plan:   Epigastric abdominal pain No red flags Ddx includes: gastritis, PUD, pancreatitis, IBS, among several others Start protonix 40 mg daily and carafate 1 mg four times daily x 1-2 weeks May use zofran 4 mg every 8 hours as needed for nausea Update labs today, will make recommendations accordingly  [place] diet handout provided Will refer to gastroenterology for further evaluation Informed patient that if new/worsening symptoms occur to reach out to office Follow up in 3 months, sooner if concerns   Palpitations Awaiting zio patch results Continue propanolol 10 mg daily  Follow up with Dr. Harl Bowie, cardiology as indicated.  Right flank pain Stable No red flags Consider referral to sports medicine if ongoing and lack of improvement  Post-menopausal bleeding Follow up with GYN as indicated.   Pancreatic abnormality Update labs today, will make recommendations accordingly  Referral to gastroenterology placed  Visual disturbance Will refer to ophthalmology for further evaluation   Elevated glucose Update labs to assess for DM  Encounter for screening for other viral diseases  - Hepatitis C Antibody  Screening for HIV (human  immunodeficiency virus)  - HIV Antibody   I,Havlyn C Ratchford,acting as a Education administrator for [employer],  PA.,have documented all relevant documentation on the behalf of [name], PA,as directed by  [name], PA while in the presence of [name], [place].  I, [name], [place], have reviewed all documentation for this visit. The documentation on 06/18/21 for the exam, diagnosis, procedures, and orders are all accurate and complete.  Time spent with patient today was 45 minutes which consisted of chart review, discussing diagnosis, work up, treatment answering questions and documentation.   [name], PA-C

## 2021-06-18 NOTE — Telephone Encounter (Signed)
Returned call to patient who states that she feels much better today. Patient reports that she saw her PCP today as well and blood work was ordered. Advised patient to monitor her BP daily and write the number down and bring back to appointment on 2/1 with Dr. Harl Bowie. Advised patient that once monitor report has been read that we will call with results. Advised patient to keep appointment as scheduled. Also Made patient aware of ED precautions should new or worsening symptoms develop. Patient verbalized understanding.    Will forward to MD to make aware.

## 2021-06-21 ENCOUNTER — Encounter: Payer: Self-pay | Admitting: Physician Assistant

## 2021-06-21 ENCOUNTER — Other Ambulatory Visit: Payer: Self-pay | Admitting: Physician Assistant

## 2021-06-21 ENCOUNTER — Other Ambulatory Visit: Payer: Self-pay

## 2021-06-21 ENCOUNTER — Other Ambulatory Visit (INDEPENDENT_AMBULATORY_CARE_PROVIDER_SITE_OTHER): Payer: No Typology Code available for payment source | Admitting: Physician Assistant

## 2021-06-21 DIAGNOSIS — F419 Anxiety disorder, unspecified: Secondary | ICD-10-CM

## 2021-06-21 DIAGNOSIS — Z114 Encounter for screening for human immunodeficiency virus [HIV]: Secondary | ICD-10-CM

## 2021-06-21 LAB — HEMOGLOBIN A1C: Hgb A1c MFr Bld: 5.9 % (ref 4.6–6.5)

## 2021-06-21 MED ORDER — SERTRALINE HCL 25 MG PO TABS: 25.0000 mg | ORAL_TABLET | Freq: Every day | ORAL | 1 refills | Status: DC

## 2021-06-21 MED ORDER — ONDANSETRON HCL 4 MG PO TABS: 4.0000 mg | ORAL_TABLET | Freq: Three times a day (TID) | ORAL | 0 refills | Status: DC | PRN

## 2021-06-21 MED ORDER — LORAZEPAM 0.5 MG PO TABS: 0.5000 mg | ORAL_TABLET | Freq: Two times a day (BID) | ORAL | 1 refills | Status: DC | PRN

## 2021-06-21 NOTE — Progress Notes (Signed)
Patient was here for blood work and asked to speak with me.  She was just diagnosed with endometrial cancer.  She has baseline anxiety that is not controlled. She would like to discuss medications. She denies SI/HI. She has a good support system in place. Her sister is aware of her recent dx.  She is agreeable to starting medications. Will plan to start: Zoloft 25 mg daily Ativan 0.5 mg prn for severe anxiety or insomnia  Recommend follow-up with me in 1 month, sooner if concerns.  I have also provided a recommendation for a talk therapist.  [name], PA-C

## 2021-06-21 NOTE — Addendum Note (Signed)
Addended by: Erlene Quan on: 06/21/2021 12:22 PM   Modules accepted: Level of Service

## 2021-06-23 ENCOUNTER — Ambulatory Visit: Payer: No Typology Code available for payment source | Admitting: Internal Medicine

## 2021-06-23 ENCOUNTER — Other Ambulatory Visit: Payer: Self-pay

## 2021-06-23 ENCOUNTER — Encounter: Payer: Self-pay | Admitting: Internal Medicine

## 2021-06-23 VITALS — BP 140/90 | HR 87 | Ht 63.0 in | Wt 296.8 lb

## 2021-06-23 DIAGNOSIS — R002 Palpitations: Secondary | ICD-10-CM | POA: Diagnosis not present

## 2021-06-23 LAB — HIV-1 RNA QUANT-NO REFLEX-BLD
HIV 1 RNA Quant: NOT DETECTED Copies/mL
HIV-1 RNA Quant, Log: NOT DETECTED Log cps/mL

## 2021-06-23 NOTE — Progress Notes (Signed)
Cardiology Office Note:    Date:  06/23/2021   ID:  Victoria Hunt, DOB 1965-09-01, MRN [id number]  PCP:  [name], PA   [facility] Providers Cardiologist:  Janina Mayo, MD     Referring MD: No ref. provider found   No chief complaint on file. Palpitations  History of Present Illness:    Victoria Hunt is a 56 y.o. female with a hx of appendicitis, appendectomy, referral from the ED for palpitations  She notes that she had COVID twice. She had sporadic palpitations. Within the last 3-4 months with palpitations. She woke up at 2 AM and felt her heart was racing. She had nausea and heartburn. She walked around the house. Then she went to the ED 05/26/2021.She had a normal w/u in the ED. She has no arrhythmia.  She had sinus rhythm and some PVCs. TSH was normal. Her hgb normal. She was diagnosed with UTI and got Keflex. She feels better. She had some flank pain. She feels constant palpitations. Her EKG is normal. She denies syncope. She was LH with COVID and dizzy spell. She's not taking medications. She is a stress and anxiety eater.Her father has an aortic aneurysm. Mother is healthy. No family hx of arrhythmia. She has hyperlipidemia. She denies smoking. No etoh, no drug use. She drinks 6-8 oz of caffeine. Coffee. Sometimes sweat tea.  Interim Hx: For above symptoms, a ziopatch was ordered and still pending. She noted some high blood pressures. She was told she was diagnosed with endometrial cancer. She has not met with oncology. Palpitations have not been an issue so not taking propanolol. Blood pressures are good at home.  She notes that she had some persistent nausea and underwent CT scan 06/13/2021 showing endometrial thickening up to 24 mm. I don't see any pathology that was done to say she definitively has cancer   The 10-year ASCVD risk score (Arnett DK, et al., 2019) is: 3.3%   Values used to calculate the score:     Age: 74 years     Sex: Female     Is Non-Hispanic  African American: No     Diabetic: No     Tobacco smoker: No     Systolic Blood Pressure: 263 mmHg     Is BP treated: Yes     HDL Cholesterol: 39 mg/dL     Total Cholesterol: 176 mg/dL   Past Medical History:  Diagnosis Date   Anxiety 04/22/2021   Cataract    possible   Hypertension 08/25/2020   Morbidly obese ([place])     Past Surgical History:  Procedure Laterality Date   APPENDECTOMY  August 25, 2020   CHOLECYSTECTOMY     LAPAROSCOPIC APPENDECTOMY N/A 08/25/2020   Procedure: APPENDECTOMY LAPAROSCOPIC;  Surgeon: Ralene Ok, MD;  Location: [facility];  Service: General;  Laterality: N/A;    Current Medications: Current Meds  Medication Sig   acetaminophen (TYLENOL) 500 MG tablet Take 1 tablet (500 mg total) by mouth every 6 (six) hours as needed for mild pain or fever. (Patient taking differently: Take 500-1,000 mg by mouth every 6 (six) hours as needed for mild pain, headache or fever.)   ibuprofen (ADVIL) 200 MG tablet Take 200 mg by mouth every 6 (six) hours as needed for mild pain, headache or fever.   LORazepam (ATIVAN) 0.5 MG tablet Take 1 tablet (0.5 mg total) by mouth 2 (two) times daily as needed for anxiety or sleep.   ondansetron (ZOFRAN) 4 MG  tablet Take 1 tablet (4 mg total) by mouth every 8 (eight) hours as needed for nausea or vomiting.   pantoprazole (PROTONIX) 40 MG tablet Take 1 tablet (40 mg total) by mouth daily.   propranolol (INDERAL) 10 MG tablet Take 1 tablet (10 mg total) by mouth daily.   sertraline (ZOLOFT) 25 MG tablet Take 1 tablet (25 mg total) by mouth daily.   sucralfate (CARAFATE) 1 g tablet Take 1 tablet (1 g total) by mouth 4 (four) times daily -  with meals and at bedtime for 14 days.     Allergies:   Patient has no known allergies.   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[place], [place]   Social Determinants of Health   Financial Resource Strain: Not on file  Food Insecurity: Not on file  Transportation Needs: Not on file  Physical Activity: Not on file  Stress: Not on file  Social Connections: Not on file   Works with children  Family History: The patient's per above family history includes Aortic aneurysm in her father; Bipolar disorder in her father; CVA in her maternal grandfather and maternal grandmother; Cancer in her paternal great-grandmother; Depression in her father and paternal grandmother; Heart attack in her father; Heart disease in her father; Hyperlipidemia in her mother; Myasthenia gravis in her paternal grandfather. There is no history of Breast cancer or Colon cancer.  ROS:   Please see the history of present illness.     All other systems reviewed and are negative.  EKGs/Labs/Other Studies Reviewed:    The following studies were reviewed today:   EKG:  EKG is  ordered today.  The ekg ordered today demonstrates  NSR, no delta wave, normal PR and QTc  Recent Labs: 05/26/2021: TSH 2.217 06/13/2021: ALT 17; BUN 14; Creatinine, Ser 0.84; Hemoglobin 14.2; Platelets 277; Potassium 4.0; Sodium 137  Recent Lipid Panel    Component Value Date/Time   CHOL 176 06/18/2021 1034   TRIG 182.0 (H) 06/18/2021 1034   HDL 39.00 (L) 06/18/2021 1034   CHOLHDL 5 06/18/2021 1034   VLDL 36.4 06/18/2021 1034   LDLCALC 101 (H) 06/18/2021 1034     Risk Assessment/Calculations:           Physical Exam:    VS:   Vitals:   06/23/21  0808  BP: 140/90  Pulse: 87  SpO2: 97%      BP 140/90 (BP Location: Left Arm, Patient Position: Sitting)    Pulse 87    Ht $R'5\' 3"'XY$  (1.6 m)    Wt 296 lb 12.8 oz (134.6 kg)    LMP  (LMP Unknown)    SpO2 97%    BMI 52.58 kg/m     Wt Readings from Last 3 Encounters:  06/23/21 296 lb 12.8 oz (  134.6 kg)  06/18/21 298 lb 6.1 oz (135.3 kg)  06/13/21 (!) 305 lb (138.3 kg)     GEN: obese, Well nourished, well developed in no acute distress HEENT: Normal, moist mucous membranes LYMPHATICS: No lymphadenopathy CARDIAC: RRR, no murmurs, rubs, gallops RESPIRATORY:  Clear to auscultation without rales, wheezing or rhonchi  ABDOMEN: Soft, non-tender, non-distended MUSCULOSKELETAL:  No edema; No deformity  SKIN: Warm and dry NEUROLOGIC:  Alert and oriented x 3 PSYCHIATRIC:  Normal affect   ASSESSMENT:    #Palpitations She endorses reduction in symptoms. Will review her ziopatch. She has propanolol as needed.   Blood pressure: her blood pressure is in good control at home. Will continue off additional antihypertensives.   PLAN:    In order of problems listed above:  Follow up 6 months       Medication Adjustments/Labs and Tests Ordered: Current medicines are reviewed at length with the patient today.  Concerns regarding medicines are outlined above.  No orders of the defined types were placed in this encounter.  No orders of the defined types were placed in this encounter.   Patient Instructions  Medication Instructions:  No Changes In Medications at this time. ' *If you need a refill on your cardiac medications before your next appointment, please call your pharmacy*  Follow-Up: At [facility], you and your health needs are our priority.  As part of our continuing mission to provide you with exceptional heart care, we have created designated Provider Care Teams.  These Care Teams include your primary Cardiologist (physician) and Advanced Practice Providers (APPs -  Physician  Assistants and Nurse Practitioners) who all work together to provide you with the care you need, when you need it.  Your next appointment:   6 month(s)  The format for your next appointment:   In Person  Provider:   Janina Mayo, MD      Signed, Janina Mayo, MD  06/23/2021 8:43 AM    [place]

## 2021-06-23 NOTE — Patient Instructions (Signed)
Medication Instructions:  No Changes In Medications at this time. ' *If you need a refill on your cardiac medications before your next appointment, please call your pharmacy*  Follow-Up: At [facility], you and your health needs are our priority.  As part of our continuing mission to provide you with exceptional heart care, we have created designated Provider Care Teams.  These Care Teams include your primary Cardiologist (physician) and Advanced Practice Providers (APPs -  Physician Assistants and Nurse Practitioners) who all work together to provide you with the care you need, when you need it.  Your next appointment:   6 month(s)  The format for your next appointment:   In Person  Provider:   Janina Mayo, MD

## 2021-06-24 ENCOUNTER — Telehealth: Payer: Self-pay | Admitting: *Deleted

## 2021-06-24 LAB — HIV-1/2 AB - DIFFERENTIATION
HIV-1 antibody: NEGATIVE
HIV-2 Ab: NEGATIVE

## 2021-06-24 LAB — HEPATITIS C ANTIBODY
Hepatitis C Ab: NONREACTIVE
SIGNAL TO CUT-OFF: <0.02 (ref <1.00)

## 2021-06-24 LAB — HIV ANTIBODY (ROUTINE TESTING W REFLEX): HIV 1&2 Ab, 4th Generation: REACTIVE — AB

## 2021-06-24 LAB — HIV-1 RNA, QUALITATIVE, TMA: HIV-1 RNA, Qualitative, TMA: NOT DETECTED

## 2021-06-24 NOTE — Telephone Encounter (Signed)
Spoke with the patient and scheduled a new patient appt with Dr Berline Lopes on 2/10 at 9:45 am. Patient given the address and phone numbr for the clinic; along with the policy for mask and visitors

## 2021-06-29 ENCOUNTER — Encounter: Payer: Self-pay | Admitting: Physician Assistant

## 2021-06-29 ENCOUNTER — Encounter: Payer: Self-pay | Admitting: Gynecologic Oncology

## 2021-07-02 ENCOUNTER — Other Ambulatory Visit: Payer: Self-pay | Admitting: Gynecologic Oncology

## 2021-07-02 ENCOUNTER — Inpatient Hospital Stay: Payer: No Typology Code available for payment source | Attending: Gynecologic Oncology | Admitting: Gynecologic Oncology

## 2021-07-02 ENCOUNTER — Inpatient Hospital Stay (HOSPITAL_BASED_OUTPATIENT_CLINIC_OR_DEPARTMENT_OTHER): Payer: No Typology Code available for payment source | Admitting: Gynecologic Oncology

## 2021-07-02 ENCOUNTER — Encounter: Payer: Self-pay | Admitting: Gynecologic Oncology

## 2021-07-02 ENCOUNTER — Other Ambulatory Visit: Payer: Self-pay

## 2021-07-02 VITALS — BP 147/93 | HR 91 | Temp 98.6°F | Resp 18 | Ht 62.99 in | Wt 292.0 lb

## 2021-07-02 DIAGNOSIS — R6881 Early satiety: Secondary | ICD-10-CM | POA: Insufficient documentation

## 2021-07-02 DIAGNOSIS — N8502 Endometrial intraepithelial neoplasia [EIN]: Secondary | ICD-10-CM | POA: Diagnosis not present

## 2021-07-02 DIAGNOSIS — F419 Anxiety disorder, unspecified: Secondary | ICD-10-CM | POA: Diagnosis not present

## 2021-07-02 DIAGNOSIS — R1010 Upper abdominal pain, unspecified: Secondary | ICD-10-CM | POA: Insufficient documentation

## 2021-07-02 DIAGNOSIS — R102 Pelvic and perineal pain: Secondary | ICD-10-CM | POA: Insufficient documentation

## 2021-07-02 DIAGNOSIS — M549 Dorsalgia, unspecified: Secondary | ICD-10-CM | POA: Insufficient documentation

## 2021-07-02 DIAGNOSIS — Z780 Asymptomatic menopausal state: Secondary | ICD-10-CM | POA: Diagnosis not present

## 2021-07-02 DIAGNOSIS — Z79899 Other long term (current) drug therapy: Secondary | ICD-10-CM | POA: Diagnosis not present

## 2021-07-02 DIAGNOSIS — I10 Essential (primary) hypertension: Secondary | ICD-10-CM | POA: Insufficient documentation

## 2021-07-02 DIAGNOSIS — Z6841 Body Mass Index (BMI) 40.0 and over, adult: Secondary | ICD-10-CM

## 2021-07-02 DIAGNOSIS — R002 Palpitations: Secondary | ICD-10-CM | POA: Diagnosis not present

## 2021-07-02 MED ORDER — STIMULANT LAXATIVE 8.6-50 MG PO TABS: 2.0000 | ORAL_TABLET | Freq: Every day | ORAL | 0 refills | Status: DC

## 2021-07-02 MED ORDER — TRAMADOL HCL 50 MG PO TABS: 50.0000 mg | ORAL_TABLET | Freq: Four times a day (QID) | ORAL | 0 refills | Status: DC | PRN

## 2021-07-02 MED ORDER — IBUPROFEN 800 MG PO TABS: 800.0000 mg | ORAL_TABLET | Freq: Two times a day (BID) | ORAL | 0 refills | Status: DC | PRN

## 2021-07-02 NOTE — Progress Notes (Signed)
Patient here for new patient consultation with Dr. Jeral Pinch and for a pre-operative discussion prior to her scheduled surgery on July 14, 2021. She is scheduled for robotic assisted total laparoscopic hysterectomy, bilateral salpingo-oophorectomy, sentinel lymph node biopsy, possible lymph node dissection, possible laparotomy, possible dilation and curettage of the uterus with IUD placement if unable to tolerate positioning for robotic surgery. The surgery was discussed in detail.  See after visit summary for additional details. Visual aids used to discuss items related to surgery including sequential compression stockings, foley catheter, IV pump, multi-modal pain regimen including tylenol, photo of the surgical robot, female reproductive system to discuss surgery in detail, IUD.      Discussed post-op pain management in detail including the aspects of the enhanced recovery pathway.  Advised her that a new prescription would be sent in for tramadol and it is only to be used for after her upcoming surgery.  We discussed the use of tylenol post-op and to monitor for a maximum of 4,000 mg in a 24 hour period.  Also prescribed sennakot to be used after surgery and to hold if having loose stools.  Discussed bowel regimen in detail.     Discussed the use of heparin pre-op, SCDs, and measures to take at home to prevent DVT including frequent mobility.  Reportable signs and symptoms of DVT discussed. Post-operative instructions discussed and expectations for after surgery. Incisional care discussed as well including reportable signs and symptoms including erythema, drainage, wound separation.     10 minutes spent with the patient.  Verbalizing understanding of material discussed. No needs or concerns voiced at the end of the visit.   Advised patient to call for any needs.  Advised that her post-operative medications had been prescribed and could be picked up at any time.    This appointment is included  in the global surgical bundle as pre-operative teaching and has no charge.

## 2021-07-02 NOTE — Patient Instructions (Signed)
Preparing for your Surgery   Plan for surgery on July 14, 2021 with Dr. Jeral Pinch at [place] will be scheduled for robotic assisted total laparoscopic hysterectomy (removal of the uterus and cervix), bilateral salpingo-oophorectomy (removal of both ovaries and fallopian tubes), sentinel lymph node biopsy, possible lymph node dissection, possible laparotomy (larger incision on your abdomen if needed), possible dilation and curettage of the uterus with IUD placement if unable to tolerate positioning for robotic surgery.    Pre-operative Testing -You will receive a phone call from presurgical testing at [facility] to arrange for a pre-operative appointment and lab work.   -Bring your insurance card, copy of an advanced directive if applicable, medication list   -At that visit, you will be asked to sign a consent for a possible blood transfusion in case a transfusion becomes necessary during surgery.  The need for a blood transfusion is rare but having consent is a necessary part of your care.      -You should not be taking blood thinners or aspirin at least ten days prior to surgery unless instructed by your surgeon.   -Do not take supplements such as fish oil (omega 3), red yeast rice, turmeric before your surgery. You want to avoid medications with aspirin in them including headache powders such as BC or Goody's), Excedrin migraine.   Day Before Surgery at [place] will be asked to take in a light diet the day before surgery. You will be advised you can have clear liquids up until 3 hours before your surgery.     Eat a light diet the day before surgery.  Examples including soups, broths, toast, yogurt, mashed potatoes.  AVOID GAS PRODUCING FOODS. Things to avoid include carbonated beverages (fizzy beverages, sodas), raw fruits and raw vegetables (uncooked), or beans.    If your bowels are filled with gas, your surgeon will have difficulty visualizing your  pelvic organs which increases your surgical risks.   Your role in recovery Your role is to become active as soon as directed by your doctor, while still giving yourself time to heal.  Rest when you feel tired. You will be asked to do the following in order to speed your recovery:   - Cough and breathe deeply. This helps to clear and expand your lungs and can prevent pneumonia after surgery.  - [place]. Do mild physical activity. Walking or moving your legs help your circulation and body functions return to normal. Do not try to get up or walk alone the first time after surgery.   -If you develop swelling on one leg or the other, pain in the back of your leg, redness/warmth in one of your legs, please call the office or go to the Emergency Room to have a doppler to rule out a blood clot. For shortness of breath, chest pain-seek care in the Emergency Room as soon as possible. - Actively manage your pain. Managing your pain lets you move in comfort. We will ask you to rate your pain on a scale of zero to 10. It is your responsibility to tell your doctor or nurse where and how much you hurt so your pain can be treated.   Special Considerations -If you are diabetic, you may be placed on insulin after surgery to have closer control over your blood sugars to promote healing and recovery.  This does not mean that you will be discharged on insulin.  If applicable, your oral  antidiabetics will be resumed when you are tolerating a solid diet.   -Your final pathology results from surgery should be available around one week after surgery and the results will be relayed to you when available.   -FMLA forms can be faxed to [phone] and please allow 5-7 business days for completion.   Pain Management After Surgery -You have been prescribed your pain medication (TRAMADOL) and bowel regimen medications before surgery so that you can have these available when you are discharged from the  hospital. The pain medication is for use ONLY AFTER surgery and a new prescription will not be given.    -Make sure that you have Tylenol and Ibuprofen at home to use on a regular basis after surgery for pain control. We recommend alternating the medications every hour to six hours since they work differently and are processed in the body differently for pain relief. WITH THE ZOLOFT YOU ARE TAKING, IBUPROFEN CAN INCREASE THE CHANCE OF HAVING A GASTROINTESTINAL BLEED SO WOULD RECOMMEND USING IBUPROFEN SPARINGLY AND TAKE WITH FOOD.   -Review the attached handout on narcotic use and their risks and side effects.    Bowel Regimen -You have been prescribed Sennakot-S to take nightly to prevent constipation especially if you are taking the narcotic pain medication intermittently.  It is important to prevent constipation and drink adequate amounts of liquids. You can stop taking this medication when you are not taking pain medication and you are back on your normal bowel routine.   Risks of Surgery Risks of surgery are low but include bleeding, infection, damage to surrounding structures, re-operation, blood clots, and very rarely death.     Blood Transfusion Information (For the consent to be signed before surgery)   We will be checking your blood type before surgery so in case of emergencies, we will know what type of blood you would need.                                             WHAT IS A BLOOD TRANSFUSION?   A transfusion is the replacement of blood or some of its parts. Blood is made up of multiple cells which provide different functions. Red blood cells carry oxygen and are used for blood loss replacement. White blood cells fight against infection. Platelets control bleeding. Plasma helps clot blood. Other blood products are available for specialized needs, such as hemophilia or other clotting disorders. BEFORE THE TRANSFUSION  Who gives blood for transfusions?  You may be able to donate  blood to be used at a later date on yourself (autologous donation). Relatives can be asked to donate blood. This is generally not any safer than if you have received blood from a stranger. The same precautions are taken to ensure safety when a relative's blood is donated. Healthy volunteers who are fully evaluated to make sure their blood is safe. This is blood bank blood. Transfusion therapy is the safest it has ever been in the practice of medicine. Before blood is taken from a donor, a complete history is taken to make sure that person has no history of diseases nor engages in risky social behavior (examples are intravenous drug use or sexual activity with multiple partners). The donor's travel history is screened to minimize risk of transmitting infections, such as malaria. The donated blood is tested for signs of infectious diseases, such as HIV  and hepatitis. The blood is then tested to be sure it is compatible with you in order to minimize the chance of a transfusion reaction. If you or a relative donates blood, this is often done in anticipation of surgery and is not appropriate for emergency situations. It takes many days to process the donated blood. RISKS AND COMPLICATIONS Although transfusion therapy is very safe and saves many lives, the main dangers of transfusion include:  Getting an infectious disease. Developing a transfusion reaction. This is an allergic reaction to something in the blood you were given. Every precaution is taken to prevent this. The decision to have a blood transfusion has been considered carefully by your caregiver before blood is given. Blood is not given unless the benefits outweigh the risks.   AFTER SURGERY INSTRUCTIONS   Return to work: 4-6 weeks if applicable   Activity: 1. Be up and out of the bed during the day.  Take a nap if needed.  You may walk up steps but be careful and use the hand rail.  Stair climbing will tire you more than you think, you may need  to stop part way and rest.    2. No lifting or straining for 6 weeks over 10 pounds. No pushing, pulling, straining for 6 weeks.   3. No driving for around 1 week(s).  Do not drive if you are taking narcotic pain medicine and make sure that your reaction time has returned.    4. You can shower as soon as the next day after surgery. Shower daily.  Use your regular soap and water (not directly on the incision) and pat your incision(s) dry afterwards; don't rub.  No tub baths or submerging your body in water until cleared by your surgeon. If you have the soap that was given to you by pre-surgical testing that was used before surgery, you do not need to use it afterwards because this can irritate your incisions.    5. No sexual activity and nothing in the vagina for 8 weeks.   6. You may experience a small amount of clear drainage from your incisions, which is normal.  If the drainage persists, increases, or changes color please call the office.   7. Do not use creams, lotions, or ointments such as neosporin on your incisions after surgery until advised by your surgeon because they can cause removal of the dermabond glue on your incisions.     8. You may experience vaginal spotting after surgery or around the 6-8 week mark from surgery when the stitches at the top of the vagina begin to dissolve.  The spotting is normal but if you experience heavy bleeding, call our office.   9. Take Tylenol or ibuprofen first for pain and only use narcotic pain medication for severe pain not relieved by the Tylenol or Ibuprofen.  Monitor your Tylenol intake to a max of 4,000 mg in a 24 hour period. You can alternate these medications after surgery.   Diet: 1. Low sodium Heart Healthy Diet is recommended but you are cleared to resume your normal (before surgery) diet after your procedure.   2. It is safe to use a laxative, such as Miralax or Colace, if you have difficulty moving your bowels. You have been prescribed  Sennakot-S to take at bedtime every evening after surgery to keep bowel movements regular and to prevent constipation.     Wound Care: 1. Keep clean and dry.  Shower daily.   Reasons to call the  Doctor: Fever - Oral temperature greater than 100.4 degrees Fahrenheit Foul-smelling vaginal discharge Difficulty urinating Nausea and vomiting Increased pain at the site of the incision that is unrelieved with pain medicine. Difficulty breathing with or without chest pain New calf pain especially if only on one side Sudden, continuing increased vaginal bleeding with or without clots.   Contacts: For questions or concerns you should contact:   Dr. Jeral Pinch at [phone]   Joylene John, NP at [phone]   After Hours: call [phone] and have the GYN Oncologist paged/contacted (after 5 pm or on the weekends).   Messages sent via mychart are for non-urgent matters and are not responded to after hours so for urgent needs, please call the after hours number.

## 2021-07-02 NOTE — Patient Instructions (Addendum)
Preparing for your Surgery  Plan for surgery on July 14, 2021 with Dr. Jeral Pinch at [place] will be scheduled for robotic assisted total laparoscopic hysterectomy (removal of the uterus and cervix), bilateral salpingo-oophorectomy (removal of both ovaries and fallopian tubes), sentinel lymph node biopsy, possible lymph node dissection, possible laparotomy (larger incision on your abdomen if needed), possible dilation and curettage of the uterus with IUD placement if unable to tolerate positioning for robotic surgery.   Pre-operative Testing -You will receive a phone call from presurgical testing at [facility] to arrange for a pre-operative appointment and lab work.  -Bring your insurance card, copy of an advanced directive if applicable, medication list  -At that visit, you will be asked to sign a consent for a possible blood transfusion in case a transfusion becomes necessary during surgery.  The need for a blood transfusion is rare but having consent is a necessary part of your care.     -You should not be taking blood thinners or aspirin at least ten days prior to surgery unless instructed by your surgeon.  -Do not take supplements such as fish oil (omega 3), red yeast rice, turmeric before your surgery. You want to avoid medications with aspirin in them including headache powders such as BC or Goody's), Excedrin migraine.  Day Before Surgery at [place] will be asked to take in a light diet the day before surgery. You will be advised you can have clear liquids up until 3 hours before your surgery.    Eat a light diet the day before surgery.  Examples including soups, broths, toast, yogurt, mashed potatoes.  AVOID GAS PRODUCING FOODS. Things to avoid include carbonated beverages (fizzy beverages, sodas), raw fruits and raw vegetables (uncooked), or beans.   If your bowels are filled with gas, your surgeon will have difficulty visualizing your pelvic  organs which increases your surgical risks.  Your role in recovery Your role is to become active as soon as directed by your doctor, while still giving yourself time to heal.  Rest when you feel tired. You will be asked to do the following in order to speed your recovery:  - Cough and breathe deeply. This helps to clear and expand your lungs and can prevent pneumonia after surgery.  - [place]. Do mild physical activity. Walking or moving your legs help your circulation and body functions return to normal. Do not try to get up or walk alone the first time after surgery.   -If you develop swelling on one leg or the other, pain in the back of your leg, redness/warmth in one of your legs, please call the office or go to the Emergency Room to have a doppler to rule out a blood clot. For shortness of breath, chest pain-seek care in the Emergency Room as soon as possible. - Actively manage your pain. Managing your pain lets you move in comfort. We will ask you to rate your pain on a scale of zero to 10. It is your responsibility to tell your doctor or nurse where and how much you hurt so your pain can be treated.  Special Considerations -If you are diabetic, you may be placed on insulin after surgery to have closer control over your blood sugars to promote healing and recovery.  This does not mean that you will be discharged on insulin.  If applicable, your oral antidiabetics will be resumed when you are tolerating a solid diet.  -  Your final pathology results from surgery should be available around one week after surgery and the results will be relayed to you when available.  -FMLA forms can be faxed to [phone] and please allow 5-7 business days for completion.  Pain Management After Surgery -You have been prescribed your pain medication (TRAMADOL) and bowel regimen medications before surgery so that you can have these available when you are discharged from the hospital. The  pain medication is for use ONLY AFTER surgery and a new prescription will not be given.   -Make sure that you have Tylenol and Ibuprofen at home to use on a regular basis after surgery for pain control. We recommend alternating the medications every hour to six hours since they work differently and are processed in the body differently for pain relief. WITH THE ZOLOFT YOU ARE TAKING, IBUPROFEN CAN INCREASE THE CHANCE OF HAVING A GASTROINTESTINAL BLEED SO WOULD RECOMMEND USING IBUPROFEN SPARINGLY AND TAKE WITH FOOD.  -Review the attached handout on narcotic use and their risks and side effects.   Bowel Regimen -You have been prescribed Sennakot-S to take nightly to prevent constipation especially if you are taking the narcotic pain medication intermittently.  It is important to prevent constipation and drink adequate amounts of liquids. You can stop taking this medication when you are not taking pain medication and you are back on your normal bowel routine.  Risks of Surgery Risks of surgery are low but include bleeding, infection, damage to surrounding structures, re-operation, blood clots, and very rarely death.   Blood Transfusion Information (For the consent to be signed before surgery)  We will be checking your blood type before surgery so in case of emergencies, we will know what type of blood you would need.                                            WHAT IS A BLOOD TRANSFUSION?  A transfusion is the replacement of blood or some of its parts. Blood is made up of multiple cells which provide different functions. Red blood cells carry oxygen and are used for blood loss replacement. White blood cells fight against infection. Platelets control bleeding. Plasma helps clot blood. Other blood products are available for specialized needs, such as hemophilia or other clotting disorders. BEFORE THE TRANSFUSION  Who gives blood for transfusions?  You may be able to donate blood to be used at a  later date on yourself (autologous donation). Relatives can be asked to donate blood. This is generally not any safer than if you have received blood from a stranger. The same precautions are taken to ensure safety when a relative's blood is donated. Healthy volunteers who are fully evaluated to make sure their blood is safe. This is blood bank blood. Transfusion therapy is the safest it has ever been in the practice of medicine. Before blood is taken from a donor, a complete history is taken to make sure that person has no history of diseases nor engages in risky social behavior (examples are intravenous drug use or sexual activity with multiple partners). The donor's travel history is screened to minimize risk of transmitting infections, such as malaria. The donated blood is tested for signs of infectious diseases, such as HIV and hepatitis. The blood is then tested to be sure it is compatible with you in order to minimize the chance of a transfusion  reaction. If you or a relative donates blood, this is often done in anticipation of surgery and is not appropriate for emergency situations. It takes many days to process the donated blood. RISKS AND COMPLICATIONS Although transfusion therapy is very safe and saves many lives, the main dangers of transfusion include:  Getting an infectious disease. Developing a transfusion reaction. This is an allergic reaction to something in the blood you were given. Every precaution is taken to prevent this. The decision to have a blood transfusion has been considered carefully by your caregiver before blood is given. Blood is not given unless the benefits outweigh the risks.  AFTER SURGERY INSTRUCTIONS  Return to work: 4-6 weeks if applicable  Activity: 1. Be up and out of the bed during the day.  Take a nap if needed.  You may walk up steps but be careful and use the hand rail.  Stair climbing will tire you more than you think, you may need to stop part way and  rest.   2. No lifting or straining for 6 weeks over 10 pounds. No pushing, pulling, straining for 6 weeks.  3. No driving for around 1 week(s).  Do not drive if you are taking narcotic pain medicine and make sure that your reaction time has returned.   4. You can shower as soon as the next day after surgery. Shower daily.  Use your regular soap and water (not directly on the incision) and pat your incision(s) dry afterwards; don't rub.  No tub baths or submerging your body in water until cleared by your surgeon. If you have the soap that was given to you by pre-surgical testing that was used before surgery, you do not need to use it afterwards because this can irritate your incisions.   5. No sexual activity and nothing in the vagina for 8 weeks.  6. You may experience a small amount of clear drainage from your incisions, which is normal.  If the drainage persists, increases, or changes color please call the office.  7. Do not use creams, lotions, or ointments such as neosporin on your incisions after surgery until advised by your surgeon because they can cause removal of the dermabond glue on your incisions.    8. You may experience vaginal spotting after surgery or around the 6-8 week mark from surgery when the stitches at the top of the vagina begin to dissolve.  The spotting is normal but if you experience heavy bleeding, call our office.  9. Take Tylenol or ibuprofen first for pain and only use narcotic pain medication for severe pain not relieved by the Tylenol or Ibuprofen.  Monitor your Tylenol intake to a max of 4,000 mg in a 24 hour period. You can alternate these medications after surgery.  Diet: 1. Low sodium Heart Healthy Diet is recommended but you are cleared to resume your normal (before surgery) diet after your procedure.  2. It is safe to use a laxative, such as Miralax or Colace, if you have difficulty moving your bowels. You have been prescribed Sennakot-S to take at bedtime  every evening after surgery to keep bowel movements regular and to prevent constipation.    Wound Care: 1. Keep clean and dry.  Shower daily.  Reasons to call the Doctor: Fever - Oral temperature greater than 100.4 degrees Fahrenheit Foul-smelling vaginal discharge Difficulty urinating Nausea and vomiting Increased pain at the site of the incision that is unrelieved with pain medicine. Difficulty breathing with or without chest pain  New calf pain especially if only on one side Sudden, continuing increased vaginal bleeding with or without clots.   Contacts: For questions or concerns you should contact:  Dr. Jeral Pinch at [phone]  Joylene John, NP at [phone]  After Hours: call [phone] and have the GYN Oncologist paged/contacted (after 5 pm or on the weekends).  Messages sent via mychart are for non-urgent matters and are not responded to after hours so for urgent needs, please call the after hours number.

## 2021-07-02 NOTE — H&P (View-Only) (Signed)
GYNECOLOGIC ONCOLOGY NEW PATIENT CONSULTATION   Patient Name: Victoria Hunt  Patient Age: 56 y.o. 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  We reviewed the diagnosis of complex atypical hyperplasia (CAH) and the treatment options, including medical management (Mirena IUD or progesterone PO) or hysterectomy.  Given her age, completed childbearing, and concern for possible underlying endometrial cancer based on biopsy, I recommend proceeding with surgical management. The patient also desires to proceed with surgical management.    The patient is a suitable candidate for hysterectomy via a minimally invasive approach to surgery.  Given that she is postmenopausal, a bilateral salpingo-oophorectomy is also recommended.  We reviewed that robotic assistance would be used to complete the surgery.  We discussed that endometrial cancer is detected in about 40% of final uterine pathology specimens from patients with CAH.  We discussed that the standard approach is to send the uterus for intraoperative frozen pathology.  If cancer is identified at the time of surgery, additional procedures including lymph node evaluation, is recommended. I also offered the approach to proceed with a sentinel lymph node biopsy. While removal of lymph nodes would be over treatment if no cancer is ultimately diagnosed, this procedure would avoid full lymph node removal if cancer ultimately found on frozen section and if the patient met Mayo criteria to necessitate lymph node assessment. We discussed the risks and benefits of both approaches, and the patient wishes to proceed with sentinel lymph node biopsy. I reviewed that if mapping is not successful, then the default would become to send  he uterus for intraoperative frozen section.   We reviewed the sentinel lymph node technique. Risks and benefits of sentinel lymph node biopsy was reviewed. We reviewed the technique and ICG dye. The patient DOES NOT have an iodine allergy or known liver dysfunction. We reviewed the false negative rate (0.4%), and that 3% of patients with metastatic disease will not have it detected by SLN biopsy in endometrial cancer. A low risk of allergic reaction to the dye, <0.2% for ICG, has been reported. We also discussed that in the case of failed mapping, which occurs 40% of the time, a bilateral or unilateral lymphadenectomy will be performed at the surgeons discretion.   Potential benefits of sentinel nodes including a higher detection rate for metastasis due to ultrastaging and potential reduction in operative morbidity. However, there remains uncertainty as to the role for treatment of micrometastatic disease. Further, the benefit of operative morbidity associated with the SLN technique in endometrial cancer is not yet completely known. In other patient populations (e.g. the cervical cancer population) there has been observed reductions in morbidity with SLN biopsy compared to pelvic lymphadenectomy. Lymphedema, nerve dysfunction and lymphocysts are all potential risks with the SLN technique as with complete lymphadenectomy. Additional risks to the patient include the risk of damage to an internal organ while operating in an altered view (e.g. the black and white image of the robotic fluorescence imaging mode).   I discussed the risk that she does not tolerate Trendelenburg positioning an insufflation needed to perform the surgery. In this case, I would recommend that we proceed with D&C and Mirena IUD insertion. . We reviewed the role of progesterone therapy and the effect on preneoplastic lesions, believed to include induction of apoptosis in addition  to tissue sloughing during withdrawal bleeding.  Activation  of the progesterone receptors is believed to lead stromal decidualization and thinning of the lining.  We reviewed the 3 most studied options, to include levonorgesterol IUD (38mcg/d), oral medorxyprogesterone acetate 10mg  daily or cyclically 80-32 days per month, or oral megesterol acetate 40-200mg  per day.  She understands that all options have few side effects, most common being infrequent edema, GI disturbances, and thromboembolic events), but that local progesterone through IUD may have a stronger effect on the endometrium with less systemic side effects.  The goal would then be to work towards enough weight loss to make definitive surgery possible/safe within the next 6-12 months.   We discussed the plan for a robotic assisted hysterectomy, bilateral salpingo-oophorectomy, sentinel lymph node evaluation, possible lymph node dissection, possible laparotomy, possible D&C with Mirena IUD insertion. The risks of surgery were discussed in detail and she understands these to include infection; wound separation; hernia; vaginal cuff separation, injury to adjacent organs such as bowel, bladder, blood vessels, ureters and nerves; bleeding which may require blood transfusion; anesthesia risk; thromboembolic events; possible death; unforeseen complications; possible need for re-exploration; medical complications such as heart attack, stroke, pleural effusion and pneumonia; and, if full lymphadenectomy is performed the risk of lymphedema and lymphocyst. The patient will receive DVT and antibiotic prophylaxis as indicated. She voiced a clear understanding. She had the opportunity to ask questions. Perioperative instructions were reviewed with her. Prescriptions for post-op medications were sent to her pharmacy of choice.  We discussed her upper abdominal symptoms and that I suspect they are unrelated to the intrauterine process. There is no definitive evidence of metastatic disease on her CT scan.   A copy of this  note was sent to the patient's referring provider.   75 minutes of total time was spent for this patient encounter, including preparation, face-to-face counseling with the patient and coordination of care, and documentation of the encounter.   Jeral Pinch, MD  Division of Gynecologic Oncology  Department of Obstetrics and Gynecology  [facility]  ___________________________________________  Chief Complaint: Chief Complaint  Patient presents with   Endometrial intraepithelial neoplasia (EIN)    History of Present Illness:  Victoria Hunt is a 56 y.o. y.o. female who is seen in consultation at the request of Carlyon Shadow, MD for an evaluation of at least complex atypical hyperplasia.  Patient endorses regular menses stopping at age 44.  Her bleeding then became much more infrequent with bleeding episodes happening about every 3 months.  Since April of last year, she describes having spotting intermittently.  Then in November or December 2022, she began having increased frequency of vaginal spotting as well as associated pelvic cramping and back pain.  She still describes the bleeding is quite light when she has it, not occurring daily, sometimes has passage of what appears to be tissue.  Around the holidays, she began developing early satiety, decreased appetite, upper abdominal pain, palpitations, and episodes where she felt hyperglycemic.  She has seen a cardiologist and had a work-up including EKG and Holter monitor, none of which has revealed significant abnormalities.  Additionally, she has had 2 emergency department visits in the last several months.  At the second of these, she was offered CT imaging given her abdominal symptoms.  CT showed subtle findings possibly indicating early or mild pancreatitis.  Incidental finding was also noted of a thickened endometrial lining.  Given CT findings, patient called her OB/GYN and moved  up her already  scheduled visit.  On 1/26, she underwent endometrial biopsy which involved removal of a prolapsing polyp through her cervix.  Pathology from this revealed endometrial polyp with complex atypical hyperplasia and extensive squamous metaplasia.  Comment was noted that there are focally back-to-back and cribriform glands concerning for well-differentiated adenocarcinoma although diagnosis of cancer could not be made.  The patient tells me she has had testing with her OBGYN confirming menopausal status.   Patient endorses some ongoing intermittent nausea.  Denies any emesis.  Has early satiety and decreased appetite.  Reports normal bowel function although since starting Zoloft several weeks ago for anxiety notes some looser stools.  Denies any bladder symptoms.  Patient is currently living with her mother who will be available during her recovery.  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  PAST MEDICAL HISTORY:  Past Medical History:  Diagnosis Date   Anxiety 04/22/2021   Cataract    possible   Endometrial intraepithelial neoplasia (EIN)    Hypertension 08/25/2020   Morbidly obese ([place])      PAST SURGICAL HISTORY:  Past Surgical History:  Procedure Laterality Date   APPENDECTOMY  August 25, 2020   CHOLECYSTECTOMY     LAPAROSCOPIC APPENDECTOMY N/A 08/25/2020   Procedure: APPENDECTOMY LAPAROSCOPIC;  Surgeon: Ralene Ok, MD;  Location: [facility];  Service: General;  Laterality: N/A;    OB/GYN HISTORY:  OB History  Gravida Para Term Preterm AB Living  1 1          SAB IAB Ectopic Multiple Live Births               # Outcome Date GA Lbr Len/2nd Weight Sex Delivery Anes PTL Lv  1 Para             No LMP recorded (lmp unknown). Patient is postmenopausal.  Age at menarche: 11 Age at menopause: 13 Hx of HRT: Denies Hx of STDs: Denies Last pap: 2016, normal.  Patient had one in January that per chart showed ASCUS. History of abnormal pap smears:  Denies  SCREENING STUDIES:  Last mammogram: Had one scheduled in January but it was rescheduled and is happening next week.  This is both because the patient has never had a mammogram but also because she recently felt a breast lump  Last colonoscopy: Has never had, has been referred to GI given her recent issues and will plan to have a colonoscopy after her surgery with me. Last bone mineral density: Never had  MEDICATIONS: Outpatient Encounter Medications as of 07/02/2021  Medication Sig   acetaminophen (TYLENOL) 500 MG tablet Take 1 tablet (500 mg total) by mouth every 6 (six) hours as needed for mild pain or fever.   ibuprofen (ADVIL) 800 MG tablet Take 1 tablet (800 mg total) by mouth every 12 (twelve) hours as needed for moderate pain. For AFTER surgery only   LORazepam (ATIVAN) 0.5 MG tablet Take 1 tablet (0.5 mg total) by mouth 2 (two) times daily as needed for anxiety or sleep.   ondansetron (ZOFRAN) 4 MG tablet Take 1 tablet (4 mg total) by mouth every 8 (eight) hours as needed for nausea or vomiting.   senna-docusate (STIMULANT LAXATIVE) 8.6-50 MG tablet Take 2 tablets by mouth at bedtime. For AFTER surgery, do not take if having diarrhea   sertraline (ZOLOFT) 25 MG tablet Take 1 tablet (25 mg total) by mouth daily.   traMADol (ULTRAM) 50 MG tablet Take 1  tablet (50 mg total) by mouth every 6 (six) hours as needed for severe pain. For AFTER surgery only, do not take and drive   [DISCONTINUED] ibuprofen (ADVIL) 200 MG tablet Take 200 mg by mouth every 6 (six) hours as needed for mild pain, headache or fever.   pantoprazole (PROTONIX) 40 MG tablet Take 1 tablet (40 mg total) by mouth daily. (Patient not taking: Reported on 07/02/2021)   propranolol (INDERAL) 10 MG tablet Take 1 tablet (10 mg total) by mouth daily. (Patient not taking: Reported on 07/02/2021)   sucralfate (CARAFATE) 1 g tablet Take 1 tablet (1 g total) by mouth 4 (four) times daily -  with meals and at bedtime for 14 days.  (Patient not taking: Reported on 07/02/2021)   No facility-administered encounter medications on file as of 07/02/2021.    ALLERGIES:  No Known Allergies   FAMILY HISTORY:  Family History  Problem Relation Age of Onset   Hyperlipidemia Mother    Depression Father    Heart disease Father    Heart attack Father    Aortic aneurysm Father    Bipolar disorder Father    CVA Maternal Grandmother    CVA Maternal Grandfather    Depression Paternal Grandmother    Myasthenia gravis Paternal Grandfather    Cancer Paternal Great-grandmother        uterine or cervical   Breast cancer Neg Hx    Colon cancer Neg Hx    Pancreatic cancer Neg Hx    Prostate cancer Neg Hx      SOCIAL HISTORY:  Social Connections: Not on file    REVIEW OF SYSTEMS:  Pertinent positives include decreased appetite, fatigue, vaginal bleeding, back pain, cramping, anxiety. Denies fevers, chills, unexplained weight changes. Denies hearing loss, neck lumps or masses, mouth sores, ringing in ears or voice changes. Denies cough or wheezing.  Denies shortness of breath. Denies chest pain or palpitations. Denies leg swelling. Denies abdominal distention, pain, blood in stools, constipation, diarrhea, nausea, vomiting, or early satiety. Denies pain with intercourse, dysuria, frequency, hematuria or incontinence. Denies hot flashes, pelvic pain, or vaginal discharge.   Denies joint pain or muscle pain. Denies itching, rash, or wounds. Denies dizziness, headaches, numbness or seizures. Denies swollen lymph nodes or glands, denies easy bruising or bleeding. Denies depression, confusion, or decreased concentration.  Physical Exam:  Vital Signs for this encounter:  Blood pressure (!) 147/93, pulse 91, temperature 98.6 F (37 C), temperature source Oral, resp. rate 18, height 5' 2.99" (1.6 m), weight 292 lb (132.5 kg), SpO2 100 %. Body mass index is 51.74 kg/m. General: Alert, oriented, no acute distress.  HEENT:  Normocephalic, atraumatic. Sclera anicteric.  Chest: Clear to auscultation bilaterally. No wheezes, rhonchi, or rales. Cardiovascular: Regular rate and rhythm, no murmurs, rubs, or gallops.  Abdomen: Obese. Normoactive bowel sounds. Soft, nondistended, nontender to palpation. No masses or hepatosplenomegaly appreciated. No palpable fluid wave.  Extremities: Grossly normal range of motion. Warm, well perfused. No edema bilaterally.  Skin: No rashes or lesions.  Lymphatics: No cervical, supraclavicular, or inguinal adenopathy.  GU:  Normal external female genitalia. No lesions. No discharge or bleeding.             Bladder/urethra:  No lesions or masses, well supported bladder             Vagina: Well rugated, no lesions or masses.             Cervix: Normal appearing, no lesions.  Uterus: Small, mobile, no parametrial involvement or nodularity.             Adnexa: No masses appreciated.  Rectal: Furred.  LABORATORY AND RADIOLOGIC DATA:  Outside medical records were reviewed to synthesize the above history, along with the history and physical obtained during the visit.   Lab Results  Component Value Date   WBC 8.2 06/13/2021   HGB 14.2 06/13/2021   HCT 44.7 06/13/2021   PLT 277 06/13/2021   GLUCOSE 103 (H) 06/13/2021   CHOL 176 06/18/2021   TRIG 182.0 (H) 06/18/2021   HDL 39.00 (L) 06/18/2021   LDLCALC 101 (H) 06/18/2021   ALT 17 06/13/2021   AST 20 06/13/2021   NA 137 06/13/2021   K 4.0 06/13/2021   CL 103 06/13/2021   CREATININE 0.84 06/13/2021   BUN 14 06/13/2021   CO2 22 06/13/2021   TSH 2.217 05/26/2021   HGBA1C 5.9 06/18/2021   CT A/P on 06/13/21: 1. Possible subtle inflammatory changes involving the pancreatic head as may be seen with mild or early pancreatitis. Suggest correlation with enzymes. 2. Otherwise no CT evidence for acute intra-abdominal or pelvic abnormality. 3. Heterogenous endometrial stripe thickening up to 24 mm, recommend correlation with  pelvic ultrasound which may be performed on a nonemergent basis.

## 2021-07-02 NOTE — Progress Notes (Signed)
GYNECOLOGIC ONCOLOGY NEW PATIENT CONSULTATION   Patient Name: Victoria Hunt  Patient Age: 56 y.o. 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  We reviewed the diagnosis of complex atypical hyperplasia (CAH) and the treatment options, including medical management (Mirena IUD or progesterone PO) or hysterectomy.  Given her age, completed childbearing, and concern for possible underlying endometrial cancer based on biopsy, I recommend proceeding with surgical management. The patient also desires to proceed with surgical management.    The patient is a suitable candidate for hysterectomy via a minimally invasive approach to surgery.  Given that she is postmenopausal, a bilateral salpingo-oophorectomy is also recommended.  We reviewed that robotic assistance would be used to complete the surgery.  We discussed that endometrial cancer is detected in about 40% of final uterine pathology specimens from patients with CAH.  We discussed that the standard approach is to send the uterus for intraoperative frozen pathology.  If cancer is identified at the time of surgery, additional procedures including lymph node evaluation, is recommended. I also offered the approach to proceed with a sentinel lymph node biopsy. While removal of lymph nodes would be over treatment if no cancer is ultimately diagnosed, this procedure would avoid full lymph node removal if cancer ultimately found on frozen section and if the patient met Mayo criteria to necessitate lymph node assessment. We discussed the risks and benefits of both approaches, and the patient wishes to proceed with sentinel lymph node biopsy. I reviewed that if mapping is not successful, then the default would become to send  he uterus for intraoperative frozen section.   We reviewed the sentinel lymph node technique. Risks and benefits of sentinel lymph node biopsy was reviewed. We reviewed the technique and ICG dye. The patient DOES NOT have an iodine allergy or known liver dysfunction. We reviewed the false negative rate (0.4%), and that 3% of patients with metastatic disease will not have it detected by SLN biopsy in endometrial cancer. A low risk of allergic reaction to the dye, <0.2% for ICG, has been reported. We also discussed that in the case of failed mapping, which occurs 40% of the time, a bilateral or unilateral lymphadenectomy will be performed at the surgeons discretion.   Potential benefits of sentinel nodes including a higher detection rate for metastasis due to ultrastaging and potential reduction in operative morbidity. However, there remains uncertainty as to the role for treatment of micrometastatic disease. Further, the benefit of operative morbidity associated with the SLN technique in endometrial cancer is not yet completely known. In other patient populations (e.g. the cervical cancer population) there has been observed reductions in morbidity with SLN biopsy compared to pelvic lymphadenectomy. Lymphedema, nerve dysfunction and lymphocysts are all potential risks with the SLN technique as with complete lymphadenectomy. Additional risks to the patient include the risk of damage to an internal organ while operating in an altered view (e.g. the black and white image of the robotic fluorescence imaging mode).   I discussed the risk that she does not tolerate Trendelenburg positioning an insufflation needed to perform the surgery. In this case, I would recommend that we proceed with D&C and Mirena IUD insertion. . We reviewed the role of progesterone therapy and the effect on preneoplastic lesions, believed to include induction of apoptosis in addition  to tissue sloughing during withdrawal bleeding.  Activation  of the progesterone receptors is believed to lead stromal decidualization and thinning of the lining.  We reviewed the 3 most studied options, to include levonorgesterol IUD (72mcg/d), oral medorxyprogesterone acetate 10mg  daily or cyclically 56-21 days per month, or oral megesterol acetate 40-200mg  per day.  She understands that all options have few side effects, most common being infrequent edema, GI disturbances, and thromboembolic events), but that local progesterone through IUD may have a stronger effect on the endometrium with less systemic side effects.  The goal would then be to work towards enough weight loss to make definitive surgery possible/safe within the next 6-12 months.   We discussed the plan for a robotic assisted hysterectomy, bilateral salpingo-oophorectomy, sentinel lymph node evaluation, possible lymph node dissection, possible laparotomy, possible D&C with Mirena IUD insertion. The risks of surgery were discussed in detail and she understands these to include infection; wound separation; hernia; vaginal cuff separation, injury to adjacent organs such as bowel, bladder, blood vessels, ureters and nerves; bleeding which may require blood transfusion; anesthesia risk; thromboembolic events; possible death; unforeseen complications; possible need for re-exploration; medical complications such as heart attack, stroke, pleural effusion and pneumonia; and, if full lymphadenectomy is performed the risk of lymphedema and lymphocyst. The patient will receive DVT and antibiotic prophylaxis as indicated. She voiced a clear understanding. She had the opportunity to ask questions. Perioperative instructions were reviewed with her. Prescriptions for post-op medications were sent to her pharmacy of choice.  We discussed her upper abdominal symptoms and that I suspect they are unrelated to the intrauterine process. There is no definitive evidence of metastatic disease on her CT scan.   A copy of this  note was sent to the patient's referring provider.   75 minutes of total time was spent for this patient encounter, including preparation, face-to-face counseling with the patient and coordination of care, and documentation of the encounter.   Jeral Pinch, MD  Division of Gynecologic Oncology  Department of Obstetrics and Gynecology  [facility]  ___________________________________________  Chief Complaint: Chief Complaint  Patient presents with   Endometrial intraepithelial neoplasia (EIN)    History of Present Illness:  Victoria Hunt is a 56 y.o. y.o. female who is seen in consultation at the request of Carlyon Shadow, MD for an evaluation of at least complex atypical hyperplasia.  Patient endorses regular menses stopping at age 45.  Her bleeding then became much more infrequent with bleeding episodes happening about every 3 months.  Since April of last year, she describes having spotting intermittently.  Then in November or December 2022, she began having increased frequency of vaginal spotting as well as associated pelvic cramping and back pain.  She still describes the bleeding is quite light when she has it, not occurring daily, sometimes has passage of what appears to be tissue.  Around the holidays, she began developing early satiety, decreased appetite, upper abdominal pain, palpitations, and episodes where she felt hyperglycemic.  She has seen a cardiologist and had a work-up including EKG and Holter monitor, none of which has revealed significant abnormalities.  Additionally, she has had 2 emergency department visits in the last several months.  At the second of these, she was offered CT imaging given her abdominal symptoms.  CT showed subtle findings possibly indicating early or mild pancreatitis.  Incidental finding was also noted of a thickened endometrial lining.  Given CT findings, patient called her OB/GYN and moved  up her already  scheduled visit.  On 1/26, she underwent endometrial biopsy which involved removal of a prolapsing polyp through her cervix.  Pathology from this revealed endometrial polyp with complex atypical hyperplasia and extensive squamous metaplasia.  Comment was noted that there are focally back-to-back and cribriform glands concerning for well-differentiated adenocarcinoma although diagnosis of cancer could not be made.  The patient tells me she has had testing with her OBGYN confirming menopausal status.   Patient endorses some ongoing intermittent nausea.  Denies any emesis.  Has early satiety and decreased appetite.  Reports normal bowel function although since starting Zoloft several weeks ago for anxiety notes some looser stools.  Denies any bladder symptoms.  Patient is currently living with her mother who will be available during her recovery.  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  PAST MEDICAL HISTORY:  Past Medical History:  Diagnosis Date   Anxiety 04/22/2021   Cataract    possible   Endometrial intraepithelial neoplasia (EIN)    Hypertension 08/25/2020   Morbidly obese ([place])      PAST SURGICAL HISTORY:  Past Surgical History:  Procedure Laterality Date   APPENDECTOMY  August 25, 2020   CHOLECYSTECTOMY     LAPAROSCOPIC APPENDECTOMY N/A 08/25/2020   Procedure: APPENDECTOMY LAPAROSCOPIC;  Surgeon: Ralene Ok, MD;  Location: [facility];  Service: General;  Laterality: N/A;    OB/GYN HISTORY:  OB History  Gravida Para Term Preterm AB Living  1 1          SAB IAB Ectopic Multiple Live Births               # Outcome Date GA Lbr Len/2nd Weight Sex Delivery Anes PTL Lv  1 Para             No LMP recorded (lmp unknown). Patient is postmenopausal.  Age at menarche: 67 Age at menopause: 32 Hx of HRT: Denies Hx of STDs: Denies Last pap: 2016, normal.  Patient had one in January that per chart showed ASCUS. History of abnormal pap smears:  Denies  SCREENING STUDIES:  Last mammogram: Had one scheduled in January but it was rescheduled and is happening next week.  This is both because the patient has never had a mammogram but also because she recently felt a breast lump  Last colonoscopy: Has never had, has been referred to GI given her recent issues and will plan to have a colonoscopy after her surgery with me. Last bone mineral density: Never had  MEDICATIONS: Outpatient Encounter Medications as of 07/02/2021  Medication Sig   acetaminophen (TYLENOL) 500 MG tablet Take 1 tablet (500 mg total) by mouth every 6 (six) hours as needed for mild pain or fever.   ibuprofen (ADVIL) 800 MG tablet Take 1 tablet (800 mg total) by mouth every 12 (twelve) hours as needed for moderate pain. For AFTER surgery only   LORazepam (ATIVAN) 0.5 MG tablet Take 1 tablet (0.5 mg total) by mouth 2 (two) times daily as needed for anxiety or sleep.   ondansetron (ZOFRAN) 4 MG tablet Take 1 tablet (4 mg total) by mouth every 8 (eight) hours as needed for nausea or vomiting.   senna-docusate (STIMULANT LAXATIVE) 8.6-50 MG tablet Take 2 tablets by mouth at bedtime. For AFTER surgery, do not take if having diarrhea   sertraline (ZOLOFT) 25 MG tablet Take 1 tablet (25 mg total) by mouth daily.   traMADol (ULTRAM) 50 MG tablet Take 1  tablet (50 mg total) by mouth every 6 (six) hours as needed for severe pain. For AFTER surgery only, do not take and drive   [DISCONTINUED] ibuprofen (ADVIL) 200 MG tablet Take 200 mg by mouth every 6 (six) hours as needed for mild pain, headache or fever.   pantoprazole (PROTONIX) 40 MG tablet Take 1 tablet (40 mg total) by mouth daily. (Patient not taking: Reported on 07/02/2021)   propranolol (INDERAL) 10 MG tablet Take 1 tablet (10 mg total) by mouth daily. (Patient not taking: Reported on 07/02/2021)   sucralfate (CARAFATE) 1 g tablet Take 1 tablet (1 g total) by mouth 4 (four) times daily -  with meals and at bedtime for 14 days.  (Patient not taking: Reported on 07/02/2021)   No facility-administered encounter medications on file as of 07/02/2021.    ALLERGIES:  No Known Allergies   FAMILY HISTORY:  Family History  Problem Relation Age of Onset   Hyperlipidemia Mother    Depression Father    Heart disease Father    Heart attack Father    Aortic aneurysm Father    Bipolar disorder Father    CVA Maternal Grandmother    CVA Maternal Grandfather    Depression Paternal Grandmother    Myasthenia gravis Paternal Grandfather    Cancer Paternal Great-grandmother        uterine or cervical   Breast cancer Neg Hx    Colon cancer Neg Hx    Pancreatic cancer Neg Hx    Prostate cancer Neg Hx      SOCIAL HISTORY:  Social Connections: Not on file    REVIEW OF SYSTEMS:  Pertinent positives include decreased appetite, fatigue, vaginal bleeding, back pain, cramping, anxiety. Denies fevers, chills, unexplained weight changes. Denies hearing loss, neck lumps or masses, mouth sores, ringing in ears or voice changes. Denies cough or wheezing.  Denies shortness of breath. Denies chest pain or palpitations. Denies leg swelling. Denies abdominal distention, pain, blood in stools, constipation, diarrhea, nausea, vomiting, or early satiety. Denies pain with intercourse, dysuria, frequency, hematuria or incontinence. Denies hot flashes, pelvic pain, or vaginal discharge.   Denies joint pain or muscle pain. Denies itching, rash, or wounds. Denies dizziness, headaches, numbness or seizures. Denies swollen lymph nodes or glands, denies easy bruising or bleeding. Denies depression, confusion, or decreased concentration.  Physical Exam:  Vital Signs for this encounter:  Blood pressure (!) 147/93, pulse 91, temperature 98.6 F (37 C), temperature source Oral, resp. rate 18, height 5' 2.99" (1.6 m), weight 292 lb (132.5 kg), SpO2 100 %. Body mass index is 51.74 kg/m. General: Alert, oriented, no acute distress.  HEENT:  Normocephalic, atraumatic. Sclera anicteric.  Chest: Clear to auscultation bilaterally. No wheezes, rhonchi, or rales. Cardiovascular: Regular rate and rhythm, no murmurs, rubs, or gallops.  Abdomen: Obese. Normoactive bowel sounds. Soft, nondistended, nontender to palpation. No masses or hepatosplenomegaly appreciated. No palpable fluid wave.  Extremities: Grossly normal range of motion. Warm, well perfused. No edema bilaterally.  Skin: No rashes or lesions.  Lymphatics: No cervical, supraclavicular, or inguinal adenopathy.  GU:  Normal external female genitalia. No lesions. No discharge or bleeding.             Bladder/urethra:  No lesions or masses, well supported bladder             Vagina: Well rugated, no lesions or masses.             Cervix: Normal appearing, no lesions.  Uterus: Small, mobile, no parametrial involvement or nodularity.             Adnexa: No masses appreciated.  Rectal: Furred.  LABORATORY AND RADIOLOGIC DATA:  Outside medical records were reviewed to synthesize the above history, along with the history and physical obtained during the visit.   Lab Results  Component Value Date   WBC 8.2 06/13/2021   HGB 14.2 06/13/2021   HCT 44.7 06/13/2021   PLT 277 06/13/2021   GLUCOSE 103 (H) 06/13/2021   CHOL 176 06/18/2021   TRIG 182.0 (H) 06/18/2021   HDL 39.00 (L) 06/18/2021   LDLCALC 101 (H) 06/18/2021   ALT 17 06/13/2021   AST 20 06/13/2021   NA 137 06/13/2021   K 4.0 06/13/2021   CL 103 06/13/2021   CREATININE 0.84 06/13/2021   BUN 14 06/13/2021   CO2 22 06/13/2021   TSH 2.217 05/26/2021   HGBA1C 5.9 06/18/2021   CT A/P on 06/13/21: 1. Possible subtle inflammatory changes involving the pancreatic head as may be seen with mild or early pancreatitis. Suggest correlation with enzymes. 2. Otherwise no CT evidence for acute intra-abdominal or pelvic abnormality. 3. Heterogenous endometrial stripe thickening up to 24 mm, recommend correlation with  pelvic ultrasound which may be performed on a nonemergent basis.

## 2021-07-05 NOTE — Patient Instructions (Addendum)
DUE TO COVID-19 ONLY ONE VISITOR IS ALLOWED TO COME WITH YOU AND STAY IN THE WAITING ROOM ONLY DURING PRE OP AND PROCEDURE.   **NO VISITORS ARE ALLOWED IN THE SHORT STAY AREA OR RECOVERY ROOM!!**  You are not required to quarantine, however you are required to wear a well-fitted mask when you are out and around people not in your household.  Hand Hygiene often Do NOT share personal items Notify your provider if you are in close contact with someone who has COVID or you develop fever 100.4 or greater, new onset of sneezing, cough, sore throat, shortness of breath or body aches.       Your procedure is scheduled on: Wednesday, 07-14-21   Report to [facility] Main  Entrance     Report to admitting at 9:45 AM   Call this number if you have problems the morning of surgery [phone]   Follow a light diet day before surgery, avoid gas producing foods   Do not eat food :After Midnight.   May have liquids until 9:00 AM  day of surgery  CLEAR LIQUID DIET  Foods Allowed                                                                     Foods Excluded  Water, Black Coffee (no milk/no creamer) and tea, regular and decaf                              liquids that you cannot  Plain Jell-O in any flavor  (No red)                         see through such as: Fruit ices (not with fruit pulp)                                 milk, soups, orange juice  Iced Popsicles (No red)                                    All solid food                             Apple juices Sports drinks like Gatorade (No red) Lightly seasoned clear broth or consume(fat free) Sugar    Complete one Ensure drink the morning of surgery at 9:00 AM the day of surgery.   The day of surgery:  Drink ONE (1) Pre-Surgery Clear Ensure the morning of surgery. Drink in one sitting. Do not sip.  This drink was given to you during your hospital  pre-op appointment visit. Nothing else to drink after completing the  Pre-Surgery Clear Ensure         If you have questions, please contact your surgeons office.     Oral Hygiene is also important to reduce your risk of infection.  Remember - BRUSH YOUR TEETH THE MORNING OF SURGERY WITH YOUR REGULAR TOOTHPASTE   Do NOT smoke after Midnight  Take these medicines the morning of surgery with A SIP OF WATER:  Tylenol, Lorazepam, Ondansetron, Sertraline   Stop all vitamins and herbal supplements a week before surgery.     Stop Motrin, Aleve, Ibuprofen a week before surgery.             You may not have any metal on your body including hair pins, jewelry, and body piercing             Do not wear make-up, lotions, powders, perfumes or deodorant  Do not wear nail polish including gel and S&S, artificial/acrylic nails, or any other type of covering on natural nails including finger and toenails. If you have artificial nails, gel coating, etc. that needs to be removed by a nail salon please have this removed prior to surgery or surgery may need to be canceled/ delayed if the surgeon/ anesthesia feels like they are unable to be safely monitored.   Do not shave  48 hours prior to surgery.     Contacts, dentures or bridgework may not be worn into surgery.    Patients discharged the day of surgery will not be allowed to drive home.   A responsible adult must remain with you for 24 hours after surgery.  Please read over the following fact sheets you were given: IF YOU HAVE QUESTIONS ABOUT YOUR PRE OP INSTRUCTIONS PLEASE CALL [facility] - Preparing for Surgery Before surgery, you can play an important role.  Because skin is not sterile, your skin needs to be as free of germs as possible.  You can reduce the number of germs on your skin by washing with CHG (chlorahexidine gluconate) soap before surgery.  CHG is an antiseptic cleaner which kills germs and bonds with the skin to continue killing germs even after  washing. Please DO NOT use if you have an allergy to CHG or antibacterial soaps.  If your skin becomes reddened/irritated stop using the CHG and inform your nurse when you arrive at Short Stay. Do not shave (including legs and underarms) for at least 48 hours prior to the first CHG shower.  You may shave your face/neck.  Please follow these instructions carefully:  1.  Shower with CHG Soap the night before surgery and the  morning of surgery.  2.  If you choose to wash your hair, wash your hair first as usual with your normal  shampoo.  3.  After you shampoo, rinse your hair and body thoroughly to remove the shampoo.                             4.  Use CHG as you would any other liquid soap.  You can apply chg directly to the skin and wash.  Gently with a scrungie or clean washcloth.  5.  Apply the CHG Soap to your body ONLY FROM THE NECK DOWN.   Do   not use on face/ open                           Wound or open sores. Avoid contact with eyes, ears mouth and   genitals (private parts).  Wash face,  Genitals (private parts) with your normal soap.             6.  Wash thoroughly, paying special attention to the area where your    surgery  will be performed.  7.  Thoroughly rinse your body with warm water from the neck down.  8.  DO NOT shower/wash with your normal soap after using and rinsing off the CHG Soap.                9.  Pat yourself dry with a clean towel.            10.  Wear clean pajamas.            11.  Place clean sheets on your bed the night of your first shower and do not  sleep with pets. Day of Surgery : Do not apply any lotions/deodorants the morning of surgery.  Please wear clean clothes to the hospital/surgery center.  FAILURE TO FOLLOW THESE INSTRUCTIONS MAY RESULT IN THE CANCELLATION OF YOUR SURGERY  PATIENT SIGNATURE_________________________________  NURSE  SIGNATURE__________________________________  ________________________________________________________________________  WHAT IS A BLOOD TRANSFUSION? Blood Transfusion Information  A transfusion is the replacement of blood or some of its parts. Blood is made up of multiple cells which provide different functions. Red blood cells carry oxygen and are used for blood loss replacement. White blood cells fight against infection. Platelets control bleeding. Plasma helps clot blood. Other blood products are available for specialized needs, such as hemophilia or other clotting disorders. BEFORE THE TRANSFUSION  Who gives blood for transfusions?  Healthy volunteers who are fully evaluated to make sure their blood is safe. This is blood bank blood. Transfusion therapy is the safest it has ever been in the practice of medicine. Before blood is taken from a donor, a complete history is taken to make sure that person has no history of diseases nor engages in risky social behavior (examples are intravenous drug use or sexual activity with multiple partners). The donor's travel history is screened to minimize risk of transmitting infections, such as malaria. The donated blood is tested for signs of infectious diseases, such as HIV and hepatitis. The blood is then tested to be sure it is compatible with you in order to minimize the chance of a transfusion reaction. If you or a relative donates blood, this is often done in anticipation of surgery and is not appropriate for emergency situations. It takes many days to process the donated blood. RISKS AND COMPLICATIONS Although transfusion therapy is very safe and saves many lives, the main dangers of transfusion include:  Getting an infectious disease. Developing a transfusion reaction. This is an allergic reaction to something in the blood you were given. Every precaution is taken to prevent this. The decision to have a blood transfusion has been considered carefully  by your caregiver before blood is given. Blood is not given unless the benefits outweigh the risks. AFTER THE TRANSFUSION Right after receiving a blood transfusion, you will usually feel much better and more energetic. This is especially true if your red blood cells have gotten low (anemic). The transfusion raises the level of the red blood cells which carry oxygen, and this usually causes an energy increase. The nurse administering the transfusion will monitor you carefully for complications. HOME CARE INSTRUCTIONS  No special instructions are needed after a transfusion. You may find your energy is better. Speak with your caregiver about any limitations on activity for underlying diseases you  may have. SEEK MEDICAL CARE IF:  Your condition is not improving after your transfusion. You develop redness or irritation at the intravenous (IV) site. SEEK IMMEDIATE MEDICAL CARE IF:  Any of the following symptoms occur over the next 12 hours: Shaking chills. You have a temperature by mouth above 102 F (38.9 C), not controlled by medicine. Chest, back, or muscle pain. People around you feel you are not acting correctly or are confused. Shortness of breath or difficulty breathing. Dizziness and fainting. You get a rash or develop hives. You have a decrease in urine output. Your urine turns a dark color or changes to pink, red, or brown. Any of the following symptoms occur over the next 10 days: You have a temperature by mouth above 102 F (38.9 C), not controlled by medicine. Shortness of breath. Weakness after normal activity. The white part of the eye turns yellow (jaundice). You have a decrease in the amount of urine or are urinating less often. Your urine turns a dark color or changes to pink, red, or brown. Document Released: 05/06/2000 Document Revised: 08/01/2011 Document Reviewed: 12/24/2007 [facility] Patient Information 2014 [place],  [place].  _______________________________________________________________________

## 2021-07-05 NOTE — Progress Notes (Addendum)
COVID swab appointment: N/A  COVID Vaccine Completed:  Yes x2 Date COVID Vaccine completed:  07-20-19 08-10-19 Has received booster:  Yes x3 02-20-20 09-15-20 01-29-21 COVID vaccine manufacturer: [employer]     Date of COVID positive in last 90 days: No  PCP - [name], PA Cardiologist - Phineas Inches, MD  Chest x-ray - 05-26-21 Epic EKG - 06-13-21 Epic Stress Test - N/A ECHO -  - N/A Cardiac Cath -  - N/A Pacemaker/ICD device last checked: Spinal Cord Stimulator: Long Term Monitor - 2023 Epic  Bowel Prep - N/A  Sleep Study - N/A CPAP -   Fasting Blood Sugar - Prediabetes Checks Blood Sugar - does not check   Blood Thinner Instructions: - N/A Aspirin Instructions: Last Dose:  Activity level:  Can go up a flight of stairs and perform activities of daily living without stopping and without symptoms of chest pain or shortness of breath.    Anesthesia review:  Recent cardiology eval for palpitations.  States that she continues to have periodic episodes but nothing severe.  She thinks that she may be having panic attacks  Patient denies shortness of breath, fever, cough and chest pain at PAT appointment   Patient verbalized understanding of instructions that were given to them at the PAT appointment. Patient was also instructed that they will need to review over the PAT instructions again at home before surgery.

## 2021-07-06 ENCOUNTER — Telehealth: Payer: Self-pay | Admitting: *Deleted

## 2021-07-06 ENCOUNTER — Other Ambulatory Visit: Payer: Self-pay

## 2021-07-06 ENCOUNTER — Encounter (HOSPITAL_COMMUNITY): Payer: Self-pay

## 2021-07-06 ENCOUNTER — Encounter (HOSPITAL_COMMUNITY)
Admission: RE | Admit: 2021-07-06 | Discharge: 2021-07-06 | Disposition: A | Discharge status: Home / Self Care | Payer: No Typology Code available for payment source | Source: Ambulatory Visit | Attending: Gynecologic Oncology | Admitting: Gynecologic Oncology

## 2021-07-06 DIAGNOSIS — Z01812 Encounter for preprocedural laboratory examination: Secondary | ICD-10-CM | POA: Insufficient documentation

## 2021-07-06 DIAGNOSIS — N8502 Endometrial intraepithelial neoplasia [EIN]: Secondary | ICD-10-CM | POA: Diagnosis not present

## 2021-07-06 HISTORY — DX: Unspecified osteoarthritis, unspecified site: M19.90

## 2021-07-06 HISTORY — DX: Unspecified lump in the left breast, unspecified quadrant: N63.20

## 2021-07-06 HISTORY — DX: Prediabetes: R73.03

## 2021-07-06 LAB — COMPREHENSIVE METABOLIC PANEL
ALT: 27 U/L (ref 0–44)
AST: 21 U/L (ref 15–41)
Albumin: 4.3 g/dL (ref 3.5–5.0)
Alkaline Phosphatase: 75 U/L (ref 38–126)
Anion gap: 10 (ref 5–15)
BUN: 17 mg/dL (ref 6–20)
CO2: 24 mmol/L (ref 22–32)
Calcium: 9.2 mg/dL (ref 8.9–10.3)
Chloride: 104 mmol/L (ref 98–111)
Creatinine, Ser: 0.81 mg/dL (ref 0.44–1.00)
GFR, Estimated: >60 mL/min (ref >60)
Glucose, Bld: 87 mg/dL (ref 70–99)
Potassium: 4.5 mmol/L (ref 3.5–5.1)
Sodium: 138 mmol/L (ref 135–145)
Total Bilirubin: 0.3 mg/dL (ref 0.3–1.2)
Total Protein: 7.6 g/dL (ref 6.5–8.1)

## 2021-07-06 LAB — GLUCOSE, CAPILLARY: Glucose-Capillary: 96 mg/dL (ref 70–99)

## 2021-07-06 LAB — CBC
HCT: 45.5 % (ref 36.0–46.0)
Hemoglobin: 14.8 g/dL (ref 12.0–15.0)
MCH: 28.6 pg (ref 26.0–34.0)
MCHC: 32.5 g/dL (ref 30.0–36.0)
MCV: 87.8 fL (ref 80.0–100.0)
Platelets: 301 10*3/uL (ref 150–400)
RBC: 5.18 MIL/uL — ABNORMAL HIGH (ref 3.87–5.11)
RDW: 13.2 % (ref 11.5–15.5)
WBC: 9 10*3/uL (ref 4.0–10.5)
nRBC: 0 % (ref 0.0–0.2)

## 2021-07-06 LAB — HM MAMMOGRAPHY

## 2021-07-06 NOTE — Telephone Encounter (Signed)
Please tell her thanks for letting us know. I think she should be recovered enough from surgery to go to that 3/7 appointment, but I would ask her to make sure they know she is having major surgery on 2/22. Thanks

## 2021-07-06 NOTE — Telephone Encounter (Signed)
Patient called and stated "I had a regular mammogram and then a diagnostic today. Then today the also did an US guided biopsy of the left breast. I go back on 3/7 and a needled guided biopsy. This was all at [facility] with Dr Emmit Pomfret." Explained that the message would be given to Dr Berline Lopes

## 2021-07-06 NOTE — Telephone Encounter (Signed)
Left message to return call to Dr. Lulu Riding office.

## 2021-07-07 ENCOUNTER — Telehealth: Payer: No Typology Code available for payment source | Admitting: Physician Assistant

## 2021-07-07 ENCOUNTER — Encounter: Payer: Self-pay | Admitting: Physician Assistant

## 2021-07-07 VITALS — Ht 63.0 in | Wt 285.0 lb

## 2021-07-07 DIAGNOSIS — R002 Palpitations: Secondary | ICD-10-CM | POA: Diagnosis not present

## 2021-07-07 NOTE — Progress Notes (Addendum)
TELEPHONE ENCOUNTER   Patient verbally agreed to telephone visit and is aware that copayment and coinsurance may apply. Patient was treated using telemedicine according to accepted telemedicine protocols.  Location of the patient: home Location of provider: [facility] Names of all persons participating in the telemedicine service and role in the encounter: [name], PA , Flossie Dibble  Subjective:   Chief Complaint  Patient presents with   c/o elevated blood pressure   Hot Flashes     HPI   Victoria Hunt is a 56 y.o. who identifies as a female who was assigned female at birth, and is being seen today for elevated blood pressure and palpitations.   Pt has been having episodes off and on past 7 days. Episodes start out where she feels as though she is getting hot, has stomachache, tingling, dizziness. Has happened mainly at night and even waken her up out of her sleep. She has taken ativan during these episodes -- both times the medication has helped but symptoms do not seem to completely go away all at once. The episodes can last for 30-40 minutes.  She has been checking her blood pressure during these episodes. Blood pressure has been running systolic 01-027, diastolic 25-36. She has slight headache off and on.   These episodes are similar to what took her to the ER on 06/13/21. She has since seen cardiology, wore a monitor (unremarkable) and unfortunately been diagnosed with endometrial cancer. She is planning to have a total hysterectomy one week from today.   She is taking her zoloft, started on 06/21/21. She does not think that these symptoms are related to this medication, as they were present prior to initiation. She has taken zoloft in the past and cannot recall similar symptoms.  Denies: chest pain, recent COVID infection/vaccination, fevers, travel, LE pain/swelling  She reports that she monitors her pulse ox during these episodes and has normal O2, > 97% and normal  HR <100.  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   BMI 50.0-59.9, adult ([place]) 07/02/2021   Palpitations 06/18/2021   Post-menopausal bleeding 06/18/2021   Obesity 06/18/2021   Social History   Tobacco Use   Smoking status: Never    Passive exposure: Yes   Smokeless tobacco: Never  Substance Use Topics   Alcohol use: Not Currently    Current Outpatient Medications:    acetaminophen (TYLENOL) 500 MG tablet, Take 1 tablet (500 mg total) by mouth every 6 (six) hours as needed for mild pain or fever., Disp: , Rfl:    ibuprofen (ADVIL) 200 MG tablet, Take 200 mg by mouth every 6 (six) hours as needed for moderate pain., Disp: , Rfl:    LORazepam (ATIVAN) 0.5 MG tablet, Take 1 tablet (0.5 mg total) by mouth 2 (two) times daily as needed for anxiety or sleep., Disp: 30 tablet, Rfl: 1   ondansetron (ZOFRAN) 4 MG tablet, Take 1 tablet (4 mg total) by mouth every 8 (eight) hours as needed for nausea or vomiting., Disp: 30 tablet, Rfl: 0   sertraline (ZOLOFT) 25 MG tablet, Take 1 tablet (25 mg total) by mouth daily., Disp: 90 tablet, Rfl: 1   ibuprofen (ADVIL) 800 MG tablet, Take 1 tablet (800 mg total) by mouth every 12 (twelve) hours as needed for moderate pain. For AFTER surgery only (Patient not taking: Reported on 07/07/2021), Disp: 30 tablet, Rfl: 0   senna-docusate (STIMULANT LAXATIVE) 8.6-50 MG tablet, Take  2 tablets by mouth at bedtime. For AFTER surgery, do not take if having diarrhea (Patient not taking: Reported on 07/07/2021), Disp: 30 tablet, Rfl: 0   traMADol (ULTRAM) 50 MG tablet, Take 1 tablet (50 mg total) by mouth every 6 (six) hours as needed for severe pain. For AFTER surgery only, do not take and drive (Patient not taking: Reported on 07/07/2021), Disp: 10 tablet, Rfl: 0 No Known Allergies  Assessment & Plan:   1. Palpitations   Unclear etiology  We reviewed her ER visit for this as well as cardiology visit  -- all work up has been thorough and reassuring Possibly related to panic attack/anxiety Continue to take ativan prn during these episodes I asked her to reach out to Dr. Harl Bowie to see if she has further recommendations -- I will also reach out to her as well Low threshold to go to ER if new/worrisome symptoms  No orders of the defined types were placed in this encounter.  No orders of the defined types were placed in this encounter.   [name], [place] 07/07/2021  Time spent with the patient: 40 minutes, spent in obtaining information about her symptoms, reviewing her previous labs, evaluations, and treatments, counseling her about her condition (please see the discussed topics above), and developing a plan to further investigate it; she had a number of questions which I addressed.

## 2021-07-07 NOTE — Telephone Encounter (Addendum)
Spoke with patient this morning. Per Dr. Berline Lopes thanked patient for letting us know about her mammogram and biopsy results. Patient should be recovered enough from surgery to go to her appointment on 3/7 but to make that provider aware that she is having major surgery on 2/22. Patient verbalized understanding and states they are aware.  Offered patient an earlier surgery date of 07/13/21. Patient declined and would like to keep her surgery on 07/14/21.  Patient reports she has not been feeling well the last week and is unsure if she should see her PCP. She has been experiencing weakness, fatigue, increased BP, hot flashes and tingling in her hands. Advised patient to call and follow up with her PCP. Patient verbalized understanding.

## 2021-07-09 ENCOUNTER — Emergency Department (HOSPITAL_BASED_OUTPATIENT_CLINIC_OR_DEPARTMENT_OTHER)
Admit: 2021-07-09 | Discharge: 2021-07-09 | Disposition: A | Discharge status: Home / Self Care | Payer: No Typology Code available for payment source

## 2021-07-09 ENCOUNTER — Encounter (HOSPITAL_COMMUNITY): Payer: Self-pay

## 2021-07-09 ENCOUNTER — Encounter: Payer: Self-pay | Admitting: Physician Assistant

## 2021-07-09 ENCOUNTER — Emergency Department (HOSPITAL_COMMUNITY)
Admission: EM | Admit: 2021-07-09 | Discharge: 2021-07-09 | Disposition: A | Discharge status: Home / Self Care | Payer: No Typology Code available for payment source | Attending: Emergency Medicine | Admitting: Emergency Medicine

## 2021-07-09 ENCOUNTER — Emergency Department (HOSPITAL_COMMUNITY): Payer: No Typology Code available for payment source

## 2021-07-09 ENCOUNTER — Other Ambulatory Visit: Payer: Self-pay

## 2021-07-09 DIAGNOSIS — R0602 Shortness of breath: Secondary | ICD-10-CM | POA: Diagnosis not present

## 2021-07-09 DIAGNOSIS — M79661 Pain in right lower leg: Secondary | ICD-10-CM | POA: Diagnosis not present

## 2021-07-09 DIAGNOSIS — R002 Palpitations: Secondary | ICD-10-CM | POA: Diagnosis not present

## 2021-07-09 DIAGNOSIS — R110 Nausea: Secondary | ICD-10-CM | POA: Insufficient documentation

## 2021-07-09 DIAGNOSIS — M7989 Other specified soft tissue disorders: Secondary | ICD-10-CM | POA: Insufficient documentation

## 2021-07-09 DIAGNOSIS — I10 Essential (primary) hypertension: Secondary | ICD-10-CM | POA: Insufficient documentation

## 2021-07-09 DIAGNOSIS — Z8542 Personal history of malignant neoplasm of other parts of uterus: Secondary | ICD-10-CM | POA: Diagnosis not present

## 2021-07-09 DIAGNOSIS — R202 Paresthesia of skin: Secondary | ICD-10-CM | POA: Diagnosis not present

## 2021-07-09 LAB — CBC WITH DIFFERENTIAL/PLATELET
Abs Immature Granulocytes: 0.04 10*3/uL (ref 0.00–0.07)
Basophils Absolute: 0.1 10*3/uL (ref 0.0–0.1)
Basophils Relative: 0 %
Eosinophils Absolute: 0.1 10*3/uL (ref 0.0–0.5)
Eosinophils Relative: 1 %
HCT: 43.7 % (ref 36.0–46.0)
Hemoglobin: 14.2 g/dL (ref 12.0–15.0)
Immature Granulocytes: 0 %
Lymphocytes Relative: 18 %
Lymphs Abs: 2.1 10*3/uL (ref 0.7–4.0)
MCH: 28.4 pg (ref 26.0–34.0)
MCHC: 32.5 g/dL (ref 30.0–36.0)
MCV: 87.4 fL (ref 80.0–100.0)
Monocytes Absolute: 0.9 10*3/uL (ref 0.1–1.0)
Monocytes Relative: 8 %
Neutro Abs: 8.2 10*3/uL — ABNORMAL HIGH (ref 1.7–7.7)
Neutrophils Relative %: 73 %
Platelets: 293 10*3/uL (ref 150–400)
RBC: 5 MIL/uL (ref 3.87–5.11)
RDW: 13.5 % (ref 11.5–15.5)
WBC: 11.3 10*3/uL — ABNORMAL HIGH (ref 4.0–10.5)
nRBC: 0 % (ref 0.0–0.2)

## 2021-07-09 LAB — BASIC METABOLIC PANEL
Anion gap: 8 (ref 5–15)
BUN: 18 mg/dL (ref 6–20)
CO2: 26 mmol/L (ref 22–32)
Calcium: 9.2 mg/dL (ref 8.9–10.3)
Chloride: 102 mmol/L (ref 98–111)
Creatinine, Ser: 0.9 mg/dL (ref 0.44–1.00)
GFR, Estimated: >60 mL/min (ref >60)
Glucose, Bld: 126 mg/dL — ABNORMAL HIGH (ref 70–99)
Potassium: 3.4 mmol/L — ABNORMAL LOW (ref 3.5–5.1)
Sodium: 136 mmol/L (ref 135–145)

## 2021-07-09 LAB — MAGNESIUM: Magnesium: 2.1 mg/dL (ref 1.7–2.4)

## 2021-07-09 MED ORDER — ALUM & MAG HYDROXIDE-SIMETH 200-200-20 MG/5ML PO SUSP
30.0000 mL | Freq: Once | ORAL | Status: AC
  Administered 2021-07-09: 30 mL via ORAL
  Filled 2021-07-09: qty 30

## 2021-07-09 MED ORDER — FAMOTIDINE 20 MG PO TABS: 40.0000 mg | ORAL_TABLET | Freq: Once | ORAL | Status: DC

## 2021-07-09 NOTE — ED Provider Notes (Signed)
[facility] DEPT Provider Note   CSN: [id number] Arrival date & time: 07/09/21  1753     History  Chief Complaint  Patient presents with   Hypertension   Tachycardia    Victoria Hunt is a 56 y.o. female.  56 yo F with a chief complaints of palpitations.  This been off and on for about a month and a half now.  She is seeing a cardiologist and her family doctor for this.  Has had a Zio patch placed without obvious pathology.  She tells me that she will have events where she suddenly feels nauseated and then will feel like her hands tingle she will sometimes get short of breath was at times she will feel cold but recently felt hot and then the episode will resolve.  Usually she checks her blood pressure and her heart rate and stated they vary up and down.  She was prescribed propanolol at 1 point for this but she took a couple doses and did not feel like it helped.  She was recently diagnosed with possible endometrial cancer and has a scheduled hysterectomy in a couple weeks.  She also has a breast lump that is going to get biopsied.  She works as a Freight forwarder.  Feels more weak recently than she has before.  She also feels like she has pain and swelling to the right leg compared to the left.       Home Medications Prior to Admission medications   Medication Sig Start Date End Date Taking? Authorizing Provider  acetaminophen (TYLENOL) 500 MG tablet Take 1 tablet (500 mg total) by mouth every 6 (six) hours as needed for mild pain or fever. 08/26/20   Norm Parcel, PA-C  ibuprofen (ADVIL) 200 MG tablet Take 200 mg by mouth every 6 (six) hours as needed for moderate pain.    [provider]  ibuprofen (ADVIL) 800 MG tablet Take 1 tablet (800 mg total) by mouth every 12 (twelve) hours as needed for moderate pain. For AFTER surgery only Patient not taking: Reported on 07/07/2021 07/02/21   Joylene John D, NP  LORazepam (ATIVAN) 0.5 MG tablet Take 1  tablet (0.5 mg total) by mouth 2 (two) times daily as needed for anxiety or sleep. 06/21/21   [name], PA  ondansetron (ZOFRAN) 4 MG tablet Take 1 tablet (4 mg total) by mouth every 8 (eight) hours as needed for nausea or vomiting. 06/21/21   [name], PA  senna-docusate (STIMULANT LAXATIVE) 8.6-50 MG tablet Take 2 tablets by mouth at bedtime. For AFTER surgery, do not take if having diarrhea Patient not taking: Reported on 07/07/2021 07/02/21   Joylene John D, NP  sertraline (ZOLOFT) 25 MG tablet Take 1 tablet (25 mg total) by mouth daily. 06/21/21   [name], PA  traMADol (ULTRAM) 50 MG tablet Take 1 tablet (50 mg total) by mouth every 6 (six) hours as needed for severe pain. For AFTER surgery only, do not take and drive Patient not taking: Reported on 07/07/2021 07/02/21   Joylene John D, NP      Allergies    Patient has no known allergies.    Review of Systems   Review of Systems  Physical Exam Updated Vital Signs BP (!) 147/82    Pulse 66    Temp 98.3 F (36.8 C)    Resp 17    Ht 5\' 3"  (1.6 m)    Wt 129.3 kg    LMP  (  LMP Unknown)    SpO2 98%    BMI 50.49 kg/m  Physical Exam Vitals and nursing note reviewed.  Constitutional:      General: She is not in acute distress.    Appearance: She is well-developed. She is not diaphoretic.     Comments: BMI 50  HENT:     Head: Normocephalic and atraumatic.  Eyes:     Pupils: Pupils are equal, round, and reactive to light.  Cardiovascular:     Rate and Rhythm: Normal rate and regular rhythm.     Heart sounds: No murmur heard.   No friction rub. No gallop.  Pulmonary:     Effort: Pulmonary effort is normal.     Breath sounds: No wheezing or rales.  Abdominal:     General: There is no distension.     Palpations: Abdomen is soft.     Tenderness: There is no abdominal tenderness.  Musculoskeletal:        General: No tenderness.     Cervical back: Normal range of motion and neck supple.     Comments: I do not  appreciate any obvious edema to either extremity.  Skin:    General: Skin is warm and dry.  Neurological:     Mental Status: She is alert and oriented to person, place, and time.  Psychiatric:        Behavior: Behavior normal.    ED Results / Procedures / Treatments   Labs (all labs ordered are listed, but only abnormal results are displayed) Labs Reviewed  CBC WITH DIFFERENTIAL/PLATELET - Abnormal; Notable for the following components:      Result Value   WBC 11.3 (*)    Neutro Abs 8.2 (*)    All other components within normal limits  BASIC METABOLIC PANEL - Abnormal; Notable for the following components:   Potassium 3.4 (*)    Glucose, Bld 126 (*)    All other components within normal limits  MAGNESIUM    EKG None  Radiology DG Chest Port 1 View  Result Date: 07/09/2021 CLINICAL DATA:  Shortness of breath. EXAM: PORTABLE CHEST 1 VIEW COMPARISON:  05/26/2021 FINDINGS: Cardiac silhouette and mediastinal contours are within normal limits. The lungs are clear. No pulmonary edema, pleural effusion, or pneumothorax. No significant skeletal abnormality. IMPRESSION: No active disease. Electronically Signed   By: Yvonne Kendall M.D.   On: 07/09/2021 18:40   VAS [place] LOWER EXTREMITY VENOUS (DVT)  Result Date: 07/09/2021  Lower Venous DVT Study Patient Name:  Victoria Hunt  Date of Exam:   07/09/2021 Medical Rec #: [id number]        Accession #:    [id number] Date of Birth: Feb 18, 1966        Patient Gender: F Patient Age:   64 years Exam Location:  [facility] Procedure:      VAS [place] LOWER EXTREMITY VENOUS (DVT) Referring Phys: Lainee Lehrman --------------------------------------------------------------------------------  Indications: Right calf pain and heaviness.  Limitations: Poor ultrasound/tissue interface and body habitus. Comparison Study: No prior study Performing Technologist: Maudry Mayhew MHA, RDMS, RVT, RDCS  Examination Guidelines: A complete evaluation includes B-mode  imaging, spectral Doppler, color Doppler, and power Doppler as needed of all accessible portions of each vessel. Bilateral testing is considered an integral part of a complete examination. Limited examinations for reoccurring indications may be performed as noted. The reflux portion of the exam is performed with the patient in reverse Trendelenburg.  +---------+---------------+---------+-----------+----------+--------------+  RIGHT     Compressibility Phasicity  Spontaneity Properties Thrombus Aging  +---------+---------------+---------+-----------+----------+--------------+  CFV       Full            Yes       Yes                                    +---------+---------------+---------+-----------+----------+--------------+  SFJ       Full                                                             +---------+---------------+---------+-----------+----------+--------------+  FV Prox   Full                                                             +---------+---------------+---------+-----------+----------+--------------+  FV Mid    Full                                                             +---------+---------------+---------+-----------+----------+--------------+  FV Distal Full                                                             +---------+---------------+---------+-----------+----------+--------------+  PFV       Full                                                             +---------+---------------+---------+-----------+----------+--------------+  POP       Full            Yes       Yes                                    +---------+---------------+---------+-----------+----------+--------------+  PTV       Full                                                             +---------+---------------+---------+-----------+----------+--------------+  PERO      Full                                                             +---------+---------------+---------+-----------+----------+--------------+    +----+---------------+---------+-----------+----------+--------------+  LEFT Compressibility Phasicity Spontaneity Properties Thrombus Aging  +----+---------------+---------+-----------+----------+--------------+  CFV  Full            Yes       Yes                                    +----+---------------+---------+-----------+----------+--------------+     Summary: RIGHT: - There is no evidence of deep vein thrombosis in the lower extremity.  - No cystic structure found in the popliteal fossa.  LEFT: - No evidence of common femoral vein obstruction.  *See table(s) above for measurements and observations.    Preliminary     Procedures Procedures    Medications Ordered in ED Medications  alum & mag hydroxide-simeth (MAALOX/MYLANTA) 200-200-20 MG/5ML suspension 30 mL (30 mLs Oral Given 07/09/21 1852)    ED Course/ Medical Decision Making/ A&P                           Medical Decision Making Amount and/or Complexity of Data Reviewed Labs: ordered. Radiology: ordered. ECG/medicine tests: ordered.  Risk OTC drugs.   Patient is a 56 y.o. female with a cc of palpitations.  This is been an off-and-on issue for her for about 6 weeks.  She has seen her family doctor and cardiology for this.  Has a Zio patch that did not show any arrhythmias.  She is concerned because the symptoms have persisted and slightly worsened.  No obvious finding on exam.  She is complaining of some right leg pain and swelling that I do not appreciate but will obtain a DVT study.  We will check basic blood work and electrolytes.  Chest x-ray.  I did review the patient's chart and the cardiologist note about her palpitations.  She also had a phone note into her PCP today who had verified with the cardiologist that she had no arrhythmias and they recommended her going on propanolol.  I independently interpreted the patients labs and imaging chest x-ray independently interpreted by me without focal obstructive pneumothorax.  Mild  leukocytosis very mild hypokalemia.  DVT study without DVT to the right lower extremity.  We will have the patient follow-up with her family doctor in the office.    10:14 PM:  I have discussed the diagnosis/risks/treatment options with the patient.  Evaluation and diagnostic testing in the emergency department does not suggest an emergent condition requiring admission or immediate intervention beyond what has been performed at this time.  They will follow up with  PCP. We also discussed returning to the ED immediately if new or worsening sx occur. We discussed the sx which are most concerning (e.g., sudden worsening pain, fever, inability to tolerate by mouth) that necessitate immediate return. Medications administered to the patient during their visit and any new prescriptions provided to the patient are listed below.  Medications given during this visit Medications  alum & mag hydroxide-simeth (MAALOX/MYLANTA) 200-200-20 MG/5ML suspension 30 mL (30 mLs Oral Given 07/09/21 1852)     The patient appears reasonably screen and/or stabilized for discharge and I doubt any other medical condition or other [facility] requiring further screening, evaluation, or treatment in the ED at this time prior to discharge.          Final Clinical Impression(s) / ED Diagnoses Final diagnoses:  Palpitations    Rx / DC Orders ED Discharge Orders     None  Deno Etienne, DO 07/09/21 2214

## 2021-07-09 NOTE — ED Triage Notes (Signed)
Patient BIB [facility] from home. Patient is c/o irregular and fast heart beat. Patient states that she has multiple episodes throughout the day where her heart rate increases to 120. Patient states that the episodes last 30-40 minutes. Patient was recently diagnosed with cancer.   Vital signs were:  150/110 98% RA

## 2021-07-09 NOTE — Progress Notes (Signed)
Right lower extremity venous duplex completed. Refer to "CV Proc" under chart review to view preliminary results.  07/09/2021 7:39 PM Kelby Aline., MHA, RVT, RDCS, RDMS

## 2021-07-09 NOTE — Discharge Instructions (Signed)
Go ahead and try to take the propanolol regularly.  Follow up with your family doc and cards.

## 2021-07-12 ENCOUNTER — Ambulatory Visit: Payer: No Typology Code available for payment source | Admitting: Physician Assistant

## 2021-07-12 ENCOUNTER — Encounter: Payer: Self-pay | Admitting: Physician Assistant

## 2021-07-12 ENCOUNTER — Other Ambulatory Visit: Payer: Self-pay

## 2021-07-12 VITALS — BP 142/100 | HR 81 | Temp 97.8°F | Ht 63.0 in | Wt 285.0 lb

## 2021-07-12 DIAGNOSIS — R002 Palpitations: Secondary | ICD-10-CM

## 2021-07-12 DIAGNOSIS — D72829 Elevated white blood cell count, unspecified: Secondary | ICD-10-CM | POA: Diagnosis not present

## 2021-07-12 LAB — CBC WITH DIFFERENTIAL/PLATELET
Basophils Absolute: 0.1 10*3/uL (ref 0.0–0.1)
Basophils Relative: 0.8 % (ref 0.0–3.0)
Eosinophils Absolute: 0.1 10*3/uL (ref 0.0–0.7)
Eosinophils Relative: 2 % (ref 0.0–5.0)
HCT: 43.5 % (ref 36.0–46.0)
Hemoglobin: 14.3 g/dL (ref 12.0–15.0)
Lymphocytes Relative: 22.3 % (ref 12.0–46.0)
Lymphs Abs: 1.6 10*3/uL (ref 0.7–4.0)
MCHC: 32.9 g/dL (ref 30.0–36.0)
MCV: 85.6 fl (ref 78.0–100.0)
Monocytes Absolute: 0.5 10*3/uL (ref 0.1–1.0)
Monocytes Relative: 7 % (ref 3.0–12.0)
Neutro Abs: 4.9 10*3/uL (ref 1.4–7.7)
Neutrophils Relative %: 67.9 % (ref 43.0–77.0)
Platelets: 280 10*3/uL (ref 150.0–400.0)
RBC: 5.08 Mil/uL (ref 3.87–5.11)
RDW: 14.5 % (ref 11.5–15.5)
WBC: 7.2 10*3/uL (ref 4.0–10.5)

## 2021-07-12 NOTE — Progress Notes (Signed)
Victoria Hunt is a 56 y.o. female here for follow up of HTN and Heart Palpitations.  History of Present Illness:   Chief Complaint  Patient presents with   Nausea    Pt is c/o episodes where her heart rate goes up and she gets nauseated.Blood pressure goes up slightly. After these episodes she feels real weak and tired.    HPI  Heart Palpitations Victoria Hunt has been dealing with this issue for the past couple of months and although she has had this evaluated multiple times, she is still experiencing sx. Initial evaluation was completed in the ED on 05/26/21 but despite having a UTI, sinus rhythm and some PVCs, her overall work up was normal. As a result she was recommended to f/u with Cardiology for further evaluation.   On 06/23/21, Victoria Hunt followed up with Dr. Harl Bowie, cardiology, where she explained she has having sporadic palpations that caused her to wake up out of her sleep on occasion. Additionally she was noticing nausea, heartburn, and some elevated BP readings during these episodes. At the time she had no etoh or drug use as well as no hx of smoking. Pt does have HLD and fhx of aortic aneurysm, but none with arrhythmia. Upon further discussion, it was found that pt had underwent a CT scan on 06/13/21 which found endometrial thickening up to 24 mm. She is having a total hysterectomy on 07/14/21.  As a result she completed a zio patch which was normal and was recommended to take propanolol 10 mg daily prn. Although this was the case, Victoria Hunt decided to follow up with me on 07/07/21 via tele-health due to worsening sx. Stated that during these episodes she feels nauseated in the beginning and then weak and tired once the episode concludes. She still noticed elevated BP readings, highest being 147/93 during these episodes. At that time she reported she had been taking ativan 0.5 mg twice daily prn during these episode due to initial belief that anxiety could be the cause, but was provided temporary  relief.   Pt did start taking Zoloft 25 mg daily on 06/21/21, but did not think sx were related to start of medication due to timeline not matching. Currently she returns due to sx continuing as well as new, yet resolved sx, such as throbbing sensation under collarbone and right sided back pain. Back pain was described as a tingling/numb sensation similar to what you experience with sciatica. In an effort to manage her pain she tried take ibuprofen 400 mg but this provided no relief. Denies significant pain or radiation, recent COVID infection, CP, SOB, or LE swelling.    Past Medical History:  Diagnosis Date   Anxiety 04/22/2021   Arthritis    Breast mass, left    Cataract    possible   Endometrial intraepithelial neoplasia (EIN)    Hypertension 08/25/2020   Morbidly obese (HCC)    Pre-diabetes      Social History   Tobacco Use   Smoking status: Never    Passive exposure: Yes   Smokeless tobacco: Never  Vaping Use   Vaping Use: Never used  Substance Use Topics   Alcohol use: Not Currently   Drug use: Never    Past Surgical History:  Procedure Laterality Date   APPENDECTOMY  August 25, 2020   CHOLECYSTECTOMY     LAPAROSCOPIC APPENDECTOMY N/A 08/25/2020   Procedure: APPENDECTOMY LAPAROSCOPIC;  Surgeon: Ralene Ok, MD;  Location: [facility];  Service: General;  Laterality: N/A;   WISDOM  TOOTH EXTRACTION      Family History  Problem Relation Age of Onset   Hyperlipidemia Mother    Depression Father    Heart disease Father    Heart attack Father    Aortic aneurysm Father    Bipolar disorder Father    CVA Maternal Grandmother    CVA Maternal Grandfather    Depression Paternal Grandmother    Myasthenia gravis Paternal Grandfather    Cancer Paternal Great-grandmother        uterine or cervical   Breast cancer Neg Hx    Colon cancer Neg Hx    Pancreatic cancer Neg Hx    Prostate cancer Neg Hx     No Known Allergies  Current Medications:   Current Outpatient  Medications:    acetaminophen (TYLENOL) 500 MG tablet, Take 1 tablet (500 mg total) by mouth every 6 (six) hours as needed for mild pain or fever., Disp: , Rfl:    ibuprofen (ADVIL) 200 MG tablet, Take 200 mg by mouth every 6 (six) hours as needed for moderate pain., Disp: , Rfl:    LORazepam (ATIVAN) 0.5 MG tablet, Take 1 tablet (0.5 mg total) by mouth 2 (two) times daily as needed for anxiety or sleep., Disp: 30 tablet, Rfl: 1   ondansetron (ZOFRAN) 4 MG tablet, Take 1 tablet (4 mg total) by mouth every 8 (eight) hours as needed for nausea or vomiting., Disp: 30 tablet, Rfl: 0   propranolol (INDERAL) 10 MG tablet, Take 10 mg by mouth daily in the afternoon., Disp: , Rfl:    sertraline (ZOLOFT) 25 MG tablet, Take 1 tablet (25 mg total) by mouth daily., Disp: 90 tablet, Rfl: 1   ibuprofen (ADVIL) 800 MG tablet, Take 1 tablet (800 mg total) by mouth every 12 (twelve) hours as needed for moderate pain. For AFTER surgery only (Patient not taking: Reported on 07/07/2021), Disp: 30 tablet, Rfl: 0   senna-docusate (STIMULANT LAXATIVE) 8.6-50 MG tablet, Take 2 tablets by mouth at bedtime. For AFTER surgery, do not take if having diarrhea (Patient not taking: Reported on 07/07/2021), Disp: 30 tablet, Rfl: 0   traMADol (ULTRAM) 50 MG tablet, Take 1 tablet (50 mg total) by mouth every 6 (six) hours as needed for severe pain. For AFTER surgery only, do not take and drive (Patient not taking: Reported on 07/07/2021), Disp: 10 tablet, Rfl: 0   Review of Systems:   ROS Negative unless otherwise specified per HPI. Vitals:   Vitals:   07/12/21 0929  BP: (!) 142/100  Pulse: 81  Temp: 97.8 F (36.6 C)  TempSrc: Temporal  SpO2: 97%  Weight: 285 lb (129.3 kg)  Height: 5\' 3"  (1.6 m)     Body mass index is 50.49 kg/m.  Physical Exam:   Physical Exam Vitals and nursing note reviewed.  Constitutional:      General: She is not in acute distress.    Appearance: She is well-developed. She is not ill-appearing  or toxic-appearing.  Cardiovascular:     Rate and Rhythm: Normal rate and regular rhythm.     Pulses: Normal pulses.     Heart sounds: Normal heart sounds, S1 normal and S2 normal.  Pulmonary:     Effort: Pulmonary effort is normal.     Breath sounds: Normal breath sounds.  Skin:    General: Skin is warm and dry.  Neurological:     Mental Status: She is alert.     GCS: GCS eye subscore is 4. GCS verbal subscore  is 5. GCS motor subscore is 6.  Psychiatric:        Speech: Speech normal.        Behavior: Behavior normal. Behavior is cooperative.    Assessment and Plan:   Palpitations Unclear etiology  She has had cardiology work-up -- reviewed with patient Offered increase of zoloft to 50 mg daily in case anxiety is playing a role but she would like to hold off on this for the time being until after surgery She is requesting work-up for pheochromocytoma -- will oblige Update labs, will make recommendations accordingly   - Metanephrines, Urine, 24 hour  - Catecholamines, fractionated, Urine, 24 hour  Leukocytosis, unspecified type Repeat CBC today to trend  I,Havlyn C Ratchford,acting as a scribe for [employer], PA.,have documented all relevant documentation on the behalf of [name], PA,as directed by  [name], PA while in the presence of [name], [place].  I, [name], [place], have reviewed all documentation for this visit. The documentation on 07/12/21 for the exam, diagnosis, procedures, and orders are all accurate and complete.  Time spent with patient today was 25 minutes which consisted of chart review, discussing diagnosis, work up, treatment answering questions and documentation.   [name], PA-C

## 2021-07-12 NOTE — Patient Instructions (Signed)
It was great to see you!  I have ordered urinary testing for pheochromocytoma  I have ordered repeat white blood cell test  I will be in touch with all results  Hang in there! [facility] with your surgery.  Take care,  [name] PA-C

## 2021-07-13 ENCOUNTER — Telehealth: Payer: Self-pay | Admitting: *Deleted

## 2021-07-13 NOTE — Telephone Encounter (Signed)
Telephone call to check on pre-operative status.  Patient compliant with pre-operative instructions.  Reinforced nothing to eat after midnight. Clear liquids until 0900 and at that time she may take her tylenol, lorazepam, ondansetron, and sertraline. Pt stated she will be taking those in the evening and she has spoken with the pre admission RN about this plan. Patient to arrive at 0930.  No questions or concerns voiced.  Instructed to call for any needs.

## 2021-07-14 ENCOUNTER — Ambulatory Visit (HOSPITAL_BASED_OUTPATIENT_CLINIC_OR_DEPARTMENT_OTHER): Payer: No Typology Code available for payment source | Admitting: Certified Registered"

## 2021-07-14 ENCOUNTER — Ambulatory Visit: Payer: No Typology Code available for payment source | Admitting: Physician Assistant

## 2021-07-14 ENCOUNTER — Ambulatory Visit (HOSPITAL_COMMUNITY): Payer: No Typology Code available for payment source | Admitting: Physician Assistant

## 2021-07-14 ENCOUNTER — Encounter (HOSPITAL_COMMUNITY)
Admission: RE | Disposition: A | Discharge status: Home / Self Care | Payer: Self-pay | Source: Home / Self Care | Attending: Gynecologic Oncology

## 2021-07-14 ENCOUNTER — Encounter (HOSPITAL_COMMUNITY): Payer: Self-pay | Admitting: Gynecologic Oncology

## 2021-07-14 ENCOUNTER — Ambulatory Visit (HOSPITAL_COMMUNITY)
Admission: RE | Admit: 2021-07-14 | Discharge: 2021-07-14 | Disposition: A | Discharge status: Home / Self Care | Payer: No Typology Code available for payment source | Attending: Gynecologic Oncology | Admitting: Gynecologic Oncology

## 2021-07-14 ENCOUNTER — Other Ambulatory Visit: Payer: Self-pay

## 2021-07-14 DIAGNOSIS — N8502 Endometrial intraepithelial neoplasia [EIN]: Secondary | ICD-10-CM

## 2021-07-14 DIAGNOSIS — C541 Malignant neoplasm of endometrium: Secondary | ICD-10-CM | POA: Insufficient documentation

## 2021-07-14 DIAGNOSIS — Z6841 Body Mass Index (BMI) 40.0 and over, adult: Secondary | ICD-10-CM | POA: Insufficient documentation

## 2021-07-14 DIAGNOSIS — Z780 Asymptomatic menopausal state: Secondary | ICD-10-CM | POA: Diagnosis not present

## 2021-07-14 DIAGNOSIS — N8003 Adenomyosis of the uterus: Secondary | ICD-10-CM | POA: Diagnosis not present

## 2021-07-14 DIAGNOSIS — N840 Polyp of corpus uteri: Secondary | ICD-10-CM | POA: Diagnosis not present

## 2021-07-14 DIAGNOSIS — I10 Essential (primary) hypertension: Secondary | ICD-10-CM | POA: Diagnosis not present

## 2021-07-14 HISTORY — PX: ROBOTIC ASSISTED TOTAL HYSTERECTOMY WITH BILATERAL SALPINGO OOPHERECTOMY: SHX6086

## 2021-07-14 HISTORY — PX: SENTINEL NODE BIOPSY: SHX6608

## 2021-07-14 HISTORY — DX: Endometrial intraepithelial neoplasia (EIN): N85.02

## 2021-07-14 LAB — TYPE AND SCREEN
ABO/RH(D): B POS
Antibody Screen: NEGATIVE

## 2021-07-14 LAB — GLUCOSE, CAPILLARY: Glucose-Capillary: 135 mg/dL — ABNORMAL HIGH (ref 70–99)

## 2021-07-14 LAB — ABO/RH: ABO/RH(D): B POS

## 2021-07-14 SURGERY — HYSTERECTOMY, TOTAL, ROBOT-ASSISTED, LAPAROSCOPIC, WITH BILATERAL SALPINGO-OOPHORECTOMY
Anesthesia: General | Site: Pelvis

## 2021-07-14 MED ORDER — PHENYLEPHRINE 40 MCG/ML (10ML) SYRINGE FOR IV PUSH (FOR BLOOD PRESSURE SUPPORT)
PREFILLED_SYRINGE | INTRAVENOUS | Status: DC | PRN
  Administered 2021-07-14: 160 ug via INTRAVENOUS

## 2021-07-14 MED ORDER — ACETAMINOPHEN 500 MG PO TABS
1000.0000 mg | ORAL_TABLET | ORAL | Status: AC
  Administered 2021-07-14: 1000 mg via ORAL
  Filled 2021-07-14: qty 2

## 2021-07-14 MED ORDER — ROCURONIUM BROMIDE 10 MG/ML (PF) SYRINGE
PREFILLED_SYRINGE | INTRAVENOUS | Status: DC | PRN
  Administered 2021-07-14: 80 mg via INTRAVENOUS

## 2021-07-14 MED ORDER — ROCURONIUM BROMIDE 10 MG/ML (PF) SYRINGE
PREFILLED_SYRINGE | INTRAVENOUS | Status: AC
  Filled 2021-07-14: qty 10

## 2021-07-14 MED ORDER — ONDANSETRON HCL 4 MG/2ML IJ SOLN
INTRAMUSCULAR | Status: AC
  Filled 2021-07-14: qty 2

## 2021-07-14 MED ORDER — OXYCODONE HCL 5 MG PO TABS: 5.0000 mg | ORAL_TABLET | Freq: Once | ORAL | Status: DC | PRN

## 2021-07-14 MED ORDER — HYDROMORPHONE HCL 1 MG/ML IJ SOLN
INTRAMUSCULAR | Status: AC
  Filled 2021-07-14: qty 2

## 2021-07-14 MED ORDER — LIDOCAINE HCL (PF) 2 % IJ SOLN
INTRAMUSCULAR | Status: AC
  Filled 2021-07-14: qty 5

## 2021-07-14 MED ORDER — EPHEDRINE SULFATE-NACL 50-0.9 MG/10ML-% IV SOSY
PREFILLED_SYRINGE | INTRAVENOUS | Status: DC | PRN
  Administered 2021-07-14: 10 mg via INTRAVENOUS

## 2021-07-14 MED ORDER — DEXAMETHASONE SODIUM PHOSPHATE 10 MG/ML IJ SOLN
INTRAMUSCULAR | Status: AC
  Filled 2021-07-14: qty 1

## 2021-07-14 MED ORDER — PROPOFOL 10 MG/ML IV BOLUS
INTRAVENOUS | Status: AC
  Filled 2021-07-14: qty 20

## 2021-07-14 MED ORDER — HYDROMORPHONE HCL 1 MG/ML IJ SOLN
0.2500 mg | INTRAMUSCULAR | Status: DC | PRN
  Administered 2021-07-14 (×2): 0.5 mg via INTRAVENOUS

## 2021-07-14 MED ORDER — PROMETHAZINE HCL 25 MG/ML IJ SOLN: 6.2500 mg | INTRAMUSCULAR | Status: DC | PRN

## 2021-07-14 MED ORDER — LEVONORGESTREL 20 MCG/DAY IU IUD
1.0000 | INTRAUTERINE_SYSTEM | INTRAUTERINE | Status: DC
  Filled 2021-07-14: qty 1

## 2021-07-14 MED ORDER — EPHEDRINE 5 MG/ML INJ
INTRAVENOUS | Status: AC
  Filled 2021-07-14: qty 5

## 2021-07-14 MED ORDER — BUPIVACAINE HCL 0.25 % IJ SOLN
INTRAMUSCULAR | Status: AC
  Filled 2021-07-14: qty 1

## 2021-07-14 MED ORDER — STERILE WATER FOR IRRIGATION IR SOLN
Status: DC | PRN
  Administered 2021-07-14: 1000 mL

## 2021-07-14 MED ORDER — HEPARIN SODIUM (PORCINE) 5000 UNIT/ML IJ SOLN
5000.0000 [IU] | INTRAMUSCULAR | Status: AC
  Administered 2021-07-14: 5000 [IU] via SUBCUTANEOUS
  Filled 2021-07-14: qty 1

## 2021-07-14 MED ORDER — SCOPOLAMINE 1 MG/3DAYS TD PT72
1.0000 | MEDICATED_PATCH | TRANSDERMAL | Status: DC
  Administered 2021-07-14: 1.5 mg via TRANSDERMAL
  Filled 2021-07-14: qty 1

## 2021-07-14 MED ORDER — FENTANYL CITRATE (PF) 100 MCG/2ML IJ SOLN
INTRAMUSCULAR | Status: AC
  Filled 2021-07-14: qty 2

## 2021-07-14 MED ORDER — DEXAMETHASONE SODIUM PHOSPHATE 10 MG/ML IJ SOLN
INTRAMUSCULAR | Status: DC | PRN
  Administered 2021-07-14: 4 mg via INTRAVENOUS

## 2021-07-14 MED ORDER — ORAL CARE MOUTH RINSE: 15.0000 mL | Freq: Once | OROMUCOSAL | Status: AC

## 2021-07-14 MED ORDER — MIDAZOLAM HCL 2 MG/2ML IJ SOLN
INTRAMUSCULAR | Status: DC | PRN
  Administered 2021-07-14: 2 mg via INTRAVENOUS

## 2021-07-14 MED ORDER — BUPIVACAINE HCL 0.25 % IJ SOLN
INTRAMUSCULAR | Status: DC | PRN
  Administered 2021-07-14: 33 mL

## 2021-07-14 MED ORDER — DEXAMETHASONE SODIUM PHOSPHATE 4 MG/ML IJ SOLN: 4.0000 mg | INTRAMUSCULAR | Status: DC

## 2021-07-14 MED ORDER — OXYCODONE HCL 5 MG/5ML PO SOLN: 5.0000 mg | Freq: Once | ORAL | Status: DC | PRN

## 2021-07-14 MED ORDER — SUGAMMADEX SODIUM 500 MG/5ML IV SOLN
INTRAVENOUS | Status: DC | PRN
  Administered 2021-07-14: 280 mg via INTRAVENOUS

## 2021-07-14 MED ORDER — PROPOFOL 10 MG/ML IV BOLUS
INTRAVENOUS | Status: DC | PRN
Start: 2021-07-14 — End: 2021-07-14
  Administered 2021-07-14: 200 mg via INTRAVENOUS

## 2021-07-14 MED ORDER — STERILE WATER FOR INJECTION IJ SOLN
INTRAMUSCULAR | Status: DC | PRN
  Administered 2021-07-14: 9 mL

## 2021-07-14 MED ORDER — GABAPENTIN 300 MG PO CAPS
300.0000 mg | ORAL_CAPSULE | ORAL | Status: AC
  Administered 2021-07-14: 300 mg via ORAL
  Filled 2021-07-14: qty 1

## 2021-07-14 MED ORDER — LIDOCAINE 2% (20 MG/ML) 5 ML SYRINGE
INTRAMUSCULAR | Status: DC | PRN
Start: 2021-07-14 — End: 2021-07-14
  Administered 2021-07-14: 100 mg via INTRAVENOUS

## 2021-07-14 MED ORDER — SUGAMMADEX SODIUM 500 MG/5ML IV SOLN
INTRAVENOUS | Status: AC
  Filled 2021-07-14: qty 5

## 2021-07-14 MED ORDER — STERILE WATER FOR INJECTION IJ SOLN
INTRAMUSCULAR | Status: AC
  Filled 2021-07-14: qty 10

## 2021-07-14 MED ORDER — ONDANSETRON HCL 4 MG/2ML IJ SOLN
INTRAMUSCULAR | Status: DC | PRN
  Administered 2021-07-14: 4 mg via INTRAVENOUS

## 2021-07-14 MED ORDER — CELECOXIB 200 MG PO CAPS
200.0000 mg | ORAL_CAPSULE | ORAL | Status: AC
  Administered 2021-07-14: 200 mg via ORAL
  Filled 2021-07-14: qty 1

## 2021-07-14 MED ORDER — MIDAZOLAM HCL 2 MG/2ML IJ SOLN
INTRAMUSCULAR | Status: AC
  Filled 2021-07-14: qty 2

## 2021-07-14 MED ORDER — DROPERIDOL 2.5 MG/ML IJ SOLN
INTRAMUSCULAR | Status: DC | PRN
  Administered 2021-07-14: .625 mg via INTRAVENOUS

## 2021-07-14 MED ORDER — LACTATED RINGERS IR SOLN
Status: DC | PRN
  Administered 2021-07-14: 1000 mL

## 2021-07-14 MED ORDER — FENTANYL CITRATE (PF) 100 MCG/2ML IJ SOLN
INTRAMUSCULAR | Status: DC | PRN
  Administered 2021-07-14: 100 ug via INTRAVENOUS

## 2021-07-14 MED ORDER — CHLORHEXIDINE GLUCONATE 0.12 % MT SOLN
15.0000 mL | Freq: Once | OROMUCOSAL | Status: AC
  Administered 2021-07-14: 15 mL via OROMUCOSAL

## 2021-07-14 MED ORDER — LACTATED RINGERS IV SOLN: INTRAVENOUS | Status: DC

## 2021-07-14 MED ORDER — STERILE WATER FOR INJECTION IJ SOLN
INTRAMUSCULAR | Status: DC | PRN
  Administered 2021-07-14: 1 mL via INTRAMUSCULAR

## 2021-07-14 MED ORDER — CEFAZOLIN IN SODIUM CHLORIDE 3-0.9 GM/100ML-% IV SOLN
3.0000 g | INTRAVENOUS | Status: AC
  Administered 2021-07-14: 3 g via INTRAVENOUS
  Filled 2021-07-14: qty 100

## 2021-07-14 SURGICAL SUPPLY — 93 items
APPLICATOR SURGIFLO ENDO (HEMOSTASIS) IMPLANT
BACTOSHIELD CHG 4% 4OZ (MISCELLANEOUS) ×1
BAG COUNTER SPONGE SURGICOUNT (BAG) IMPLANT
BAG LAPAROSCOPIC 12 15 PORT 16 (BASKET) ×1 IMPLANT
BAG RETRIEVAL 12/15 (BASKET) ×4
BLADE SURG SZ10 CARB STEEL (BLADE) IMPLANT
CATH ROBINSON RED A/P 16FR (CATHETERS) ×2 IMPLANT
COVER BACK TABLE 60X90IN (DRAPES) ×4 IMPLANT
COVER TIP SHEARS 8 DVNC (MISCELLANEOUS) ×3 IMPLANT
COVER TIP SHEARS 8MM DA VINCI (MISCELLANEOUS) ×1
DERMABOND ADVANCED (GAUZE/BANDAGES/DRESSINGS) ×1
DERMABOND ADVANCED .7 DNX12 (GAUZE/BANDAGES/DRESSINGS) ×3 IMPLANT
DRAPE ARM DVNC X/XI (DISPOSABLE) ×12 IMPLANT
DRAPE COLUMN DVNC XI (DISPOSABLE) ×3 IMPLANT
DRAPE DA VINCI XI ARM (DISPOSABLE) ×4
DRAPE DA VINCI XI COLUMN (DISPOSABLE) ×1
DRAPE SHEET LG 3/4 BI-LAMINATE (DRAPES) ×6 IMPLANT
DRAPE SURG IRRIG POUCH 19X23 (DRAPES) ×4 IMPLANT
DRAPE UNDERBUTTOCKS STRL (DISPOSABLE) ×4 IMPLANT
DRSG OPSITE POSTOP 4X6 (GAUZE/BANDAGES/DRESSINGS) IMPLANT
DRSG OPSITE POSTOP 4X8 (GAUZE/BANDAGES/DRESSINGS) IMPLANT
DRSG TELFA 3X8 NADH (GAUZE/BANDAGES/DRESSINGS) ×4 IMPLANT
ELECT PENCIL ROCKER SW 15FT (MISCELLANEOUS) IMPLANT
ELECT REM PT RETURN 15FT ADLT (MISCELLANEOUS) ×4 IMPLANT
GAUZE 4X4 16PLY ~~LOC~~+RFID DBL (SPONGE) ×4 IMPLANT
GLOVE SURG ENC MOIS LTX SZ6 (GLOVE) ×16 IMPLANT
GLOVE SURG ENC MOIS LTX SZ6.5 (GLOVE) ×10 IMPLANT
GLOVE SURG POLYISO LF SZ7 (GLOVE) ×2 IMPLANT
GLOVE SURG UNDER POLY LF SZ6.5 (GLOVE) ×4 IMPLANT
GOWN STRL REUS W/ TWL LRG LVL3 (GOWN DISPOSABLE) ×13 IMPLANT
GOWN STRL REUS W/TWL LRG LVL3 (GOWN DISPOSABLE) ×7 IMPLANT
HOLDER FOLEY CATH W/STRAP (MISCELLANEOUS) IMPLANT
IRRIG SUCT STRYKERFLOW 2 WTIP (MISCELLANEOUS) ×4
IRRIGATION SUCT STRKRFLW 2 WTP (MISCELLANEOUS) ×3 IMPLANT
IV LACTATED RINGERS 1000ML (IV SOLUTION) ×2 IMPLANT
KIT BASIN OR (CUSTOM PROCEDURE TRAY) ×2 IMPLANT
KIT PROCEDURE DA VINCI SI (MISCELLANEOUS) ×1
KIT PROCEDURE DVNC SI (MISCELLANEOUS) ×1 IMPLANT
KIT TURNOVER KIT A (KITS) ×2 IMPLANT
MANIPULATOR ADVINCU DEL 3.0 PL (MISCELLANEOUS) IMPLANT
MANIPULATOR ADVINCU DEL 3.5 PL (MISCELLANEOUS) ×2 IMPLANT
MANIPULATOR UTERINE 4.5 ZUMI (MISCELLANEOUS) IMPLANT
NDL HYPO 21X1.5 SAFETY (NEEDLE) ×2 IMPLANT
NDL SPNL 18GX3.5 QUINCKE PK (NEEDLE) IMPLANT
NDL SPNL 22GX3.5 QUINCKE BK (NEEDLE) ×2 IMPLANT
NEEDLE HYPO 21X1.5 SAFETY (NEEDLE) ×4 IMPLANT
NEEDLE SPNL 18GX3.5 QUINCKE PK (NEEDLE) ×4 IMPLANT
NEEDLE SPNL 22GX3.5 QUINCKE BK (NEEDLE) ×4 IMPLANT
NS IRRIG 1000ML POUR BTL (IV SOLUTION) ×2 IMPLANT
OBTURATOR OPTICAL STANDARD 8MM (TROCAR) ×1
OBTURATOR OPTICAL STND 8 DVNC (TROCAR) ×3
OBTURATOR OPTICALSTD 8 DVNC (TROCAR) ×3 IMPLANT
PACK LITHOTOMY IV (CUSTOM PROCEDURE TRAY) ×2 IMPLANT
PACK PERINEAL COLD (PAD) ×2 IMPLANT
PACK ROBOT GYN CUSTOM WL (TRAY / TRAY PROCEDURE) ×4 IMPLANT
PAD DRESSING TELFA 3X8 NADH (GAUZE/BANDAGES/DRESSINGS) ×2 IMPLANT
PAD OB MATERNITY 4.3X12.25 (PERSONAL CARE ITEMS) ×2 IMPLANT
PAD POSITIONING PINK XL (MISCELLANEOUS) ×4 IMPLANT
PANTS MESH DISP LRG (UNDERPADS AND DIAPERS) ×1 IMPLANT
PANTS MESH DISPOSABLE L (UNDERPADS AND DIAPERS) ×1
PENCIL SMOKE EVACUATOR (MISCELLANEOUS) IMPLANT
PORT ACCESS TROCAR AIRSEAL 12 (TROCAR) ×3 IMPLANT
PORT ACCESS TROCAR AIRSEAL 5M (TROCAR) ×1
POUCH SPECIMEN RETRIEVAL 10MM (ENDOMECHANICALS) IMPLANT
SCRUB CHG 4% DYNA-HEX 4OZ (MISCELLANEOUS) ×3 IMPLANT
SEAL CANN UNIV 5-8 DVNC XI (MISCELLANEOUS) ×12 IMPLANT
SEAL XI 5MM-8MM UNIVERSAL (MISCELLANEOUS) ×4
SET TRI-LUMEN FLTR TB AIRSEAL (TUBING) ×4 IMPLANT
SPIKE FLUID TRANSFER (MISCELLANEOUS) ×4 IMPLANT
SPONGE T-LAP 18X18 ~~LOC~~+RFID (SPONGE) IMPLANT
SURGIFLO W/THROMBIN 8M KIT (HEMOSTASIS) IMPLANT
SURGILUBE 2OZ TUBE FLIPTOP (MISCELLANEOUS) ×2 IMPLANT
SUT MNCRL AB 4-0 PS2 18 (SUTURE) IMPLANT
SUT PDS AB 1 TP1 96 (SUTURE) IMPLANT
SUT VIC AB 0 CT1 27 (SUTURE)
SUT VIC AB 0 CT1 27XBRD ANTBC (SUTURE) IMPLANT
SUT VIC AB 2-0 CT1 27 (SUTURE)
SUT VIC AB 2-0 CT1 TAPERPNT 27 (SUTURE) IMPLANT
SUT VIC AB 2-0 SH 27 (SUTURE) ×2
SUT VIC AB 2-0 SH 27X BRD (SUTURE) ×2 IMPLANT
SUT VIC AB 3-0 SH 27 (SUTURE) ×1
SUT VIC AB 3-0 SH 27X BRD (SUTURE) ×1 IMPLANT
SUT VIC AB 4-0 PS2 18 (SUTURE) ×8 IMPLANT
SYR 10ML LL (SYRINGE) ×2 IMPLANT
SYR BULB IRRIG 60ML STRL (SYRINGE) ×2 IMPLANT
TOWEL OR 17X26 10 PK STRL BLUE (TOWEL DISPOSABLE) ×2 IMPLANT
TOWEL OR NON WOVEN STRL DISP B (DISPOSABLE) ×2 IMPLANT
TRAP SPECIMEN MUCUS 40CC (MISCELLANEOUS) IMPLANT
TRAY FOLEY MTR SLVR 16FR STAT (SET/KITS/TRAYS/PACK) ×4 IMPLANT
TROCAR XCEL NON-BLD 5MMX100MML (ENDOMECHANICALS) IMPLANT
UNDERPAD 30X36 HEAVY ABSORB (UNDERPADS AND DIAPERS) ×8 IMPLANT
WATER STERILE IRR 1000ML POUR (IV SOLUTION) ×4 IMPLANT
YANKAUER SUCT BULB TIP 10FT TU (MISCELLANEOUS) IMPLANT

## 2021-07-14 NOTE — Anesthesia Postprocedure Evaluation (Signed)
Anesthesia Post Note  Patient: Victoria Hunt  Procedure(s) Performed: XI ROBOTIC ASSISTED TOTAL HYSTERECTOMY WITH BILATERAL SALPINGO OOPHORECTOMY, VAGINAL TEAR REPAIR (Bilateral: Pelvis) SENTINEL NODE BIOPSY (Pelvis)     Patient location during evaluation: PACU Anesthesia Type: General Level of consciousness: awake and alert Pain management: pain level controlled Vital Signs Assessment: post-procedure vital signs reviewed and stable Respiratory status: spontaneous breathing, nonlabored ventilation, respiratory function stable and patient connected to nasal cannula oxygen Cardiovascular status: blood pressure returned to baseline and stable Postop Assessment: no apparent nausea or vomiting Anesthetic complications: no   No notable events documented.  Last Vitals:  Vitals:   07/14/21 1530 07/14/21 1545  BP: 130/79 136/83  Pulse: 61 62  Resp: 10 10  Temp: 36.7 C   SpO2: 93% 94%    Last Pain:  Vitals:   07/14/21 1530  TempSrc:   PainSc: 0-No pain                 Nessie Nong S

## 2021-07-14 NOTE — Anesthesia Preprocedure Evaluation (Signed)
Anesthesia Evaluation  Patient identified by MRN, date of birth, ID band Patient awake    Reviewed: Allergy & Precautions, NPO status , Patient's Chart, lab work & pertinent test results  Airway Mallampati: II  TM Distance: <3 FB Neck ROM: Full    Dental no notable dental hx.    Pulmonary neg pulmonary ROS,    breath sounds clear to auscultation + decreased breath sounds      Cardiovascular hypertension, Normal cardiovascular exam Rhythm:Regular Rate:Normal     Neuro/Psych negative neurological ROS  negative psych ROS   GI/Hepatic negative GI ROS, Neg liver ROS,   Endo/Other  Morbid obesity  Renal/GU negative Renal ROS  negative genitourinary   Musculoskeletal negative musculoskeletal ROS (+)   Abdominal   Peds negative pediatric ROS (+)  Hematology negative hematology ROS (+)   Anesthesia Other Findings   Reproductive/Obstetrics negative OB ROS                             Anesthesia Physical Anesthesia Plan  ASA: 2  Anesthesia Plan: General   Post-op Pain Management: Dilaudid IV   Induction: Intravenous  PONV Risk Score and Plan: 3 and Ondansetron, Dexamethasone, Midazolam and Treatment may vary due to age or medical condition  Airway Management Planned: Oral ETT  Additional Equipment:   Intra-op Plan:   Post-operative Plan: Extubation in OR  Informed Consent: I have reviewed the patients History and Physical, chart, labs and discussed the procedure including the risks, benefits and alternatives for the proposed anesthesia with the patient or authorized representative who has indicated his/her understanding and acceptance.     Dental advisory given  Plan Discussed with: CRNA and Surgeon  Anesthesia Plan Comments:         Anesthesia Quick Evaluation

## 2021-07-14 NOTE — Transfer of Care (Signed)
Immediate Anesthesia Transfer of Care Note  Patient: Victoria Hunt  Procedure(s) Performed: XI ROBOTIC ASSISTED TOTAL HYSTERECTOMY WITH BILATERAL SALPINGO OOPHORECTOMY, VAGINAL TEAR REPAIR (Bilateral: Pelvis) SENTINEL NODE BIOPSY (Pelvis)  Patient Location: PACU  Anesthesia Type:General  Level of Consciousness: awake, alert  and oriented  Airway & Oxygen Therapy: Patient Spontanous Breathing and Patient connected to face mask oxygen  Post-op Assessment: Report given to RN, Post -op Vital signs reviewed and stable and Patient moving all extremities X 4  Post vital signs: Reviewed and stable  Last Vitals:  Vitals Value Taken Time  BP 138/88   Temp    Pulse 73 07/14/21 1421  Resp    SpO2 100 % 07/14/21 1421  Vitals shown include unvalidated device data.  Last Pain:  Vitals:   07/14/21 1003  TempSrc: Oral  PainSc:          Complications: No notable events documented.

## 2021-07-14 NOTE — Interval H&P Note (Signed)
History and Physical Interval Note:  07/14/2021 9:57 AM  Victoria Hunt  has presented today for surgery, with the diagnosis of Endometrial intraepithelial neoplasia.  The various methods of treatment have been discussed with the patient and family. After consideration of risks, benefits and other options for treatment, the patient has consented to  Procedure(s): XI ROBOTIC ASSISTED TOTAL HYSTERECTOMY WITH BILATERAL SALPINGO OOPHORECTOMY;POSSIBLE LAPAROTOMY (Bilateral) SENTINEL NODE BIOPSY (N/A) LYMPH NODE DISSECTION (N/A) DILATATION AND CURETTAGE (N/A) INTRAUTERINE DEVICE (IUD) INSERTION (N/A) as a surgical intervention.  The patient's history has been reviewed, patient examined, no change in status, stable for surgery.  I have reviewed the patient's chart and labs.  Questions were answered to the patient's satisfaction.     Victoria Hunt

## 2021-07-14 NOTE — Discharge Instructions (Signed)
°  Medications:  - Take ibuprofen and tylenol first line for pain control. Take these regularly (every 6 hours) to decrease the build up of pain.  - If necessary, for severe pain not relieved by ibuprofen, take tramadol.  - While taking tramadol you should take sennakot every night to reduce the likelihood of constipation. If this causes diarrhea, stop its use.  Diet: 1. Low sodium Heart Healthy Diet is recommended.  2. It is safe to use a laxative if you have difficulty moving your bowels.   Wound Care: 1. Keep clean and dry.  Shower daily. 2. Rinse with pericare bottle when urinating for vaginal stitches   Reasons to call the Doctor:  Fever - Oral temperature greater than 100.4 degrees Fahrenheit Foul-smelling vaginal discharge Difficulty urinating Nausea and vomiting Increased pain at the site of the incision that is unrelieved with pain medicine. Difficulty breathing with or without chest pain New calf pain especially if only on one side Sudden, continuing increased vaginal bleeding with or without clots.   Follow-up: 1. See Jeral Pinch in 3 weeks. You will have a phone visit once pathology is back.  Contacts: For questions or concerns you should contact:  Dr. Jeral Pinch at [phone] After hours and on week-ends call [phone] and ask to speak to the physician on call for Gynecologic Oncology

## 2021-07-14 NOTE — Op Note (Signed)
OPERATIVE NOTE  Pre-operative Diagnosis: At least CAH  Post-operative Diagnosis: same  Operation: Robotic-assisted laparoscopic total hysterectomy with bilateral salpingoophorectomy, SLN biopsy, vaginal laceration repair   Surgeon: Jeral Pinch MD  Assistant Surgeon: Lahoma Crocker MD (an MD assistant was necessary for tissue manipulation, management of robotic instrumentation, retraction and positioning due to the complexity of the case and hospital policies).   Anesthesia: GET  Urine Output: 200cc  Operative Findings:  On EUA, 8-10 cm mobile uterus. On intra-abdominal entry, normal upper abdominal survey. Normal omentum, small and large bowel. Uterus 10cm and somewhat globular, normal in appearance. Bilateral adnexa normal in appearance with some evidence on the right fallopian tube (and right uterosacral ligament) either endosalpingiosis or endometriosis. Mapping successful to right obturator and left hypogastric SLN. Some adhesions between bladder and cervix. No intra-abdominal or pelvic evidence of disease.  Estimated Blood Loss:  125cc      Total IV Fluids: see I&O flowsheet         Specimens: uterus, cervix, bilateral tubes and ovaries, right obturator and left hypogastric SLN         Complications:  None apparent; patient tolerated the procedure well.         Disposition: PACU - hemodynamically stable.  Procedure Details  The patient was seen in the Holding Room. The risks, benefits, complications, treatment options, and expected outcomes were discussed with the patient.  The patient concurred with the proposed plan, giving informed consent.  The site of surgery properly noted/marked. The patient was identified as Victoria Hunt and the procedure verified as a Robotic-assisted hysterectomy with bilateral salpingo oophorectomy with SLN biopsy.   After induction of anesthesia, the patient was draped and prepped in the usual sterile manner. Patient was placed in supine  position after anesthesia and draped and prepped in the usual sterile manner as follows: Her arms were tucked to her side with all appropriate precautions.  The shoulders were stabilized with padded shoulder blocks applied to the acromium processes.  The patient was placed in the semi-lithotomy position in Victoria Hunt.  The perineum and vagina were prepped with Victoria Hunt. The patient was draped after the Victoria Hunt had been allowed to dry for 3 minutes.  A Time Out was held and the above information confirmed.  The urethra was prepped with Betadine. Foley catheter was placed.  A sterile speculum was placed in the vagina.  The cervix was grasped with a single-tooth tenaculum. 2mg  total of ICG was injected into the cervical stroma at 2 and 9 o'clock with 1cc injected at a 1cm and 23mm depth (concentration 0.5mg /ml) in all locations. The cervix was dilated with Kennon Rounds dilators.  The Advincula uterine manipulator with a colpotomizer ring was placed without difficulty.  A pneum occluder balloon was placed over the manipulator.  OG tube placement was confirmed and to suction.   Next, a 10 mm skin incision was made 1 cm below the subcostal margin in the midclavicular line.  The 5 mm Optiview port and scope was used for direct entry.  Opening pressure was under 10 mm CO2.  The abdomen was insufflated and the findings were noted as above.   At this point and all points during the procedure, the patient's intra-abdominal pressure did not exceed 15 mmHg. Next, an 8 mm skin incision was made superior to the umbilicus and a right and left port were placed about 8 cm lateral to the robot port on the right and left side.  A fourth arm was placed on  the right.  The 5 mm assist trocar was exchanged for a 10-12 mm port. All ports were placed under direct visualization.  The patient was placed in steep Trendelenburg.  Bowel was folded away into the upper abdomen.  The robot was docked in the normal manner.  The right and  left peritoneum were opened parallel to the IP ligament to open the retroperitoneal spaces bilaterally. The round ligaments were transected. The SLN mapping was performed in bilateral pelvic basins. After identifying the ureters, the para rectal and paravesical spaces were opened up entirely with careful dissection below the level of the ureters bilaterally and to the depth of the uterine artery origin in order to skeletonize the uterine "web" and ensure visualization of all parametrial channels. The para-aortic basins were carefully exposed and evaluated for isolated para-aortic SLN's. Lymphatic channels were identified travelling to the following visualized sentinel lymph node's: right obturator, left hypogastric (lymph node just superior to the uterine artery origin along the internal iliac artery). These SLN's were separated from their surrounding lymphatic tissue, removed and sent for permanent pathology.  The hysterectomy was started.  The ureter was again noted to be on the medial leaf of the broad ligament.  The peritoneum above the ureter was incised and stretched and the infundibulopelvic ligament was skeletonized, cauterized and cut.  The posterior peritoneum was taken down to the level of the KOH ring.  The anterior peritoneum was also taken down.  The bladder flap was created to the level of the KOH ring.  The uterine artery on the right side was skeletonized, cauterized and cut in the normal manner.  A similar procedure was performed on the left.  The colpotomy was made and the uterus, cervix, bilateral ovaries and tubes were amputated and delivered through the vagina.  Pedicles were inspected and excellent hemostasis was achieved.    The colpotomy at the vaginal cuff was closed with Vicryl on a CT1 needle in a running manner.  Irrigation was used and excellent hemostasis was achieved.  At this point in the procedure was completed.  Robotic instruments were removed under direct visulaization.  The  robot was undocked. The fascia at the 10-12 mm port was closed with 0 Vicryl on a UR-5 needle.  The subcuticular tissue was closed with 4-0 Vicryl and the skin was closed with 4-0 Monocryl in a subcuticular manner.  Dermabond was applied.    The vagina was swabbed with right distal vaginal sidewall, left peri-urethral, and left posterior vaginal lacerations noted. All were bleeding. The right sidewall and posterior vaginal lacerations were repaired with running 2-0 Vicryl and the peri-urethral with 3-0 Vicryl in running fashion. Good hemostasis was achieved. Vagina was swabbed with minimal bleeding noted.  Foley catheter was removed.  All sponge, lap and needle counts were correct x  3.   The patient was transferred to the recovery room in stable condition.  Jeral Pinch, MD

## 2021-07-14 NOTE — Anesthesia Procedure Notes (Signed)
Procedure Name: Intubation Date/Time: 07/14/2021 11:59 AM Performed by: Niel Hummer, CRNA Pre-anesthesia Checklist: Patient identified, Suction available, Emergency Drugs available and Patient being monitored Patient Re-evaluated:Patient Re-evaluated prior to induction Oxygen Delivery Method: Circle system utilized Preoxygenation: Pre-oxygenation with 100% oxygen Induction Type: IV induction Ventilation: Mask ventilation without difficulty Laryngoscope Size: Mac and 4 Grade View: Grade I Tube type: Oral Tube size: 7.0 mm Number of attempts: 1 Airway Equipment and Method: Stylet Placement Confirmation: ETT inserted through vocal cords under direct vision, positive ETCO2 and breath sounds checked- equal and bilateral Secured at: 23 cm Tube secured with: Tape Dental Injury: Teeth and Oropharynx as per pre-operative assessment

## 2021-07-15 ENCOUNTER — Encounter (HOSPITAL_COMMUNITY): Payer: Self-pay | Admitting: Gynecologic Oncology

## 2021-07-15 ENCOUNTER — Telehealth: Payer: Self-pay | Admitting: *Deleted

## 2021-07-15 NOTE — Telephone Encounter (Signed)
Spoke with [name] this morning. She states she is eating, drinking and urinating well. She has had a BM yet and is passing gas. She is taking senokot as prescribed and encouraged her to drink plenty of water. She denies fever or chills. Incisions are dry and intact. She rates her pain 3/10. Her pain is controlled with ibuprofen    Instructed to call office with any fever, chills, purulent drainage, uncontrolled pain or any other questions or concerns. Patient verbalizes understanding.   Pt aware of post op appointments as well as the office number [phone] and after hours number [phone] to call if she has any questions or concerns

## 2021-07-16 ENCOUNTER — Other Ambulatory Visit: Payer: Self-pay

## 2021-07-20 ENCOUNTER — Ambulatory Visit: Payer: No Typology Code available for payment source | Admitting: Physician Assistant

## 2021-07-21 ENCOUNTER — Encounter: Payer: Self-pay | Admitting: Gynecologic Oncology

## 2021-07-21 ENCOUNTER — Encounter: Payer: Self-pay | Admitting: Oncology

## 2021-07-21 ENCOUNTER — Inpatient Hospital Stay: Payer: No Typology Code available for payment source | Attending: Gynecologic Oncology | Admitting: Gynecologic Oncology

## 2021-07-21 DIAGNOSIS — C541 Malignant neoplasm of endometrium: Secondary | ICD-10-CM | POA: Insufficient documentation

## 2021-07-21 DIAGNOSIS — Z90710 Acquired absence of both cervix and uterus: Secondary | ICD-10-CM

## 2021-07-21 DIAGNOSIS — Z90722 Acquired absence of ovaries, bilateral: Secondary | ICD-10-CM

## 2021-07-21 HISTORY — DX: Malignant neoplasm of endometrium: C54.1

## 2021-07-21 NOTE — Progress Notes (Signed)
Requested clarification of cervical margins on accession [id number] with [facility] Pathology via email. ?

## 2021-07-21 NOTE — Progress Notes (Signed)
Gynecologic Oncology Telehealth Consult Note: Gyn-Onc  I connected with Victoria Hunt on 07/21/21 at  4:20 PM EST by telephone and verified that I am speaking with the correct person using two identifiers.  I discussed the limitations, risks, security and privacy concerns of performing an evaluation and management service by telemedicine and the availability of in-person appointments. I also discussed with the patient that there may be a patient responsible charge related to this service. The patient expressed understanding and agreed to proceed.  Other persons participating in the visit and their role in the encounter: none.  Patient's location: home Provider's location: [facility]  Reason for Visit: follow-up after surgery, treatment planning  Treatment History: 07/14/21: TRH/BSO, SLN biopsy, vaginal laceration repair  Interval History: Patient reports doing well since surgery. Denies abdominal or pelvic pain. Endorses normal bowel and bladder. She had 24-48 hours of vaginal spotting after surgery.   Past Medical/Surgical History: Past Medical History:  Diagnosis Date   Anxiety 04/22/2021   Arthritis    Breast mass, left    Cataract    possible   Endometrial intraepithelial neoplasia (EIN)    Hypertension 08/25/2020   Morbidly obese (HCC)    Pre-diabetes     Past Surgical History:  Procedure Laterality Date   APPENDECTOMY  August 25, 2020   CHOLECYSTECTOMY     LAPAROSCOPIC APPENDECTOMY N/A 08/25/2020   Procedure: APPENDECTOMY LAPAROSCOPIC;  Surgeon: Ralene Ok, MD;  Location: [facility];  Service: General;  Laterality: N/A;   ROBOTIC ASSISTED TOTAL HYSTERECTOMY WITH BILATERAL SALPINGO OOPHERECTOMY Bilateral 07/14/2021   Procedure: XI ROBOTIC ASSISTED TOTAL HYSTERECTOMY WITH BILATERAL SALPINGO OOPHORECTOMY, VAGINAL TEAR REPAIR;  Surgeon: Lafonda Mosses, MD;  Location: [facility];  Service: Gynecology;  Laterality: Bilateral;   SENTINEL NODE BIOPSY N/A 07/14/2021   Procedure:  SENTINEL NODE BIOPSY;  Surgeon: Lafonda Mosses, MD;  Location: [facility];  Service: Gynecology;  Laterality: N/A;   WISDOM TOOTH EXTRACTION      Family History  Problem Relation Age of Onset   Hyperlipidemia Mother    Depression Father    Heart disease Father    Heart attack Father    Aortic aneurysm Father    Bipolar disorder Father    CVA Maternal Grandmother    CVA Maternal Grandfather    Depression Paternal Grandmother    Myasthenia gravis Paternal Grandfather    Cancer Paternal Great-grandmother        uterine or cervical   Breast cancer Neg Hx    Colon cancer Neg Hx    Pancreatic cancer Neg Hx    Prostate cancer Neg Hx     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  Tobacco Use   Smoking status: Never    Passive exposure: Yes   Smokeless tobacco: Never  Vaping Use   Vaping Use: Never used  Substance and Sexual Activity   Alcohol use: Not Currently   Drug use: Never   Sexual activity: Not Currently    Birth control/protection: Post-menopausal  Other Topics Concern   Not on file  Social History Narrative   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 -- [place], [place]   Social Determinants of Health  Financial Resource Strain: Not on file  Food Insecurity: Not on file  Transportation Needs: Not on file  Physical Activity: Not on file  Stress: Not on file  Social Connections: Not on file    Current Medications:  Current Outpatient Medications:    acetaminophen (TYLENOL) 500 MG tablet, Take 1 tablet (500 mg total) by mouth every 6 (six) hours as needed for mild pain or fever., Disp: , Rfl:    ibuprofen (ADVIL) 200 MG tablet, Take 200 mg by mouth every 6 (six) hours as needed for moderate pain., Disp: , Rfl:     ibuprofen (ADVIL) 800 MG tablet, Take 1 tablet (800 mg total) by mouth every 12 (twelve) hours as needed for moderate pain. For AFTER surgery only (Patient not taking: Reported on 07/07/2021), Disp: 30 tablet, Rfl: 0   LORazepam (ATIVAN) 0.5 MG tablet, Take 1 tablet (0.5 mg total) by mouth 2 (two) times daily as needed for anxiety or sleep., Disp: 30 tablet, Rfl: 1   ondansetron (ZOFRAN) 4 MG tablet, Take 1 tablet (4 mg total) by mouth every 8 (eight) hours as needed for nausea or vomiting., Disp: 30 tablet, Rfl: 0   propranolol (INDERAL) 10 MG tablet, Take 10 mg by mouth daily in the afternoon., Disp: , Rfl:    senna-docusate (STIMULANT LAXATIVE) 8.6-50 MG tablet, Take 2 tablets by mouth at bedtime. For AFTER surgery, do not take if having diarrhea (Patient not taking: Reported on 07/07/2021), Disp: 30 tablet, Rfl: 0   sertraline (ZOLOFT) 25 MG tablet, Take 1 tablet (25 mg total) by mouth daily., Disp: 90 tablet, Rfl: 1   traMADol (ULTRAM) 50 MG tablet, Take 1 tablet (50 mg total) by mouth every 6 (six) hours as needed for severe pain. For AFTER surgery only, do not take and drive (Patient not taking: Reported on 07/07/2021), Disp: 10 tablet, Rfl: 0  Review of Symptoms: Pertinent positives as per HPI.  Physical Exam: LMP  (LMP Unknown)  Deferred given limitations of phone visit.   Laboratory & Radiologic Studies: A. LYMPH NODE, SENTINEL, RIGHT OBTURATOR, EXCISION:  - Lymph node, negative for carcinoma (0/1)   B. LYMPH NODE, SENTINEL, LEFT INTERNAL ILIAC, EXCISION:  - Lymph node, negative for carcinoma (0/1)   C. UTERUS, CERVIX, BILATERAL FALLOPIAN TUBES AND OVARIES:  - Endometrial carcinoma with foci of squamous differentiation, FIGO  grade 1  - Carcinoma invades for a depth of about 17 mm where myometrial  thickness is about 20 mm  - Cervical stroma is involved by the tumor  - Adenomyosis  - Benign unremarkable bilateral fallopian tubes and ovaries  - See oncology table   Procedure:  Total hysterectomy and bilateral salpingo-oophorectomy  Histologic Type: Endometrioid carcinoma with foci of squamous  differentiation  Histologic Grade: FIGO grade 1  Myometrial Invasion:       Depth of Myometrial Invasion (mm): 17 mm       Myometrial Thickness (mm): 20 mm       Percentage of Myometrial Invasion: 85%  Uterine Serosa Involvement: Not identified  Cervical stromal Involvement: Present, anterior cervix  Extent of involvement of other tissue/organs: Not identified  Peritoneal/Ascitic Fluid: Not applicable  Lymphovascular Invasion: Not identified  Regional Lymph Nodes:       Pelvic Lymph Nodes Examined:                                   2 Sentinel  0 Non-sentinel                                   2 Total       Pelvic Lymph Nodes with Metastasis: 0                           Macrometastasis: (>2.0 mm): 0                           Micrometastasis: (>0.2 mm and < 2.0 mm): 0                           Isolated Tumor Cells (<0.2 mm): 0                           Laterality of Lymph Node with Tumor: Not  applicable                           Extracapsular Extension: Not applicable       Para-aortic Lymph Nodes Examined:                                    0 Sentinel                                    0 non-sentinel                                    0 total  Distant Metastasis:       Distant Site(s) Involved: Not applicable   Assessment & Plan: Victoria Hunt is a 56 y.o. 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  Patient is meeting postoperative milestones. Discussed continued expectations.   MMR and MSI testing are pending.  Discussed pathology report in detail. Will check with pathology regarding margin status on the cervix. Preoperative CT without definitive evidence of metastatic disease. Will present her case at tumor board. Given cervical involvement (pending MMR/MSI testing),  discussed pelvic RT +/- vaginal brachytherapy.   I discussed the assessment and treatment plan with the patient. The patient was provided with an opportunity to ask questions and all were answered. The patient agreed with the plan and demonstrated an understanding of the instructions.   The patient was advised to call back or see an in-person evaluation if the symptoms worsen or if the condition fails to improve as anticipated.   16 minutes of total time was spent for this patient encounter, including preparation, phone counseling with the patient and coordination of care, and documentation of the encounter.   Jeral Pinch, MD  Division of Gynecologic Oncology  Department of Obstetrics and Gynecology  [facility]

## 2021-07-22 LAB — CATECHOLAMINES, FRACTIONATED, URINE, 24 HOUR
Calc Total (E+NE): 58 mcg/24 h (ref 26–121)
Creatinine, Urine mg/day-CATEUR: 1.63 g/(24.h) (ref 0.50–2.15)
Dopamine 24 Hr Urine: 229 mcg/24 h (ref 52–480)
Epinephrine, 24H, Ur: 5 mcg/24 h (ref 2–24)
Norepinephrine, 24H, Ur: 53 mcg/24 h (ref 15–100)
Total Volume: 1800 mL

## 2021-07-22 LAB — METANEPHRINES, URINE, 24 HOUR
METANEPHRINE: 83 mcg/24 h — ABNORMAL LOW (ref 90–315)
METANEPHRINES, TOTAL: 476 mcg/24 h (ref 224–832)
NORMETANEPHRINE: 393 mcg/24 h (ref 122–676)
Total Volume: 1800 mL

## 2021-07-23 LAB — SURGICAL PATHOLOGY

## 2021-07-27 ENCOUNTER — Other Ambulatory Visit: Payer: Self-pay | Admitting: Radiology

## 2021-07-27 ENCOUNTER — Telehealth: Payer: Self-pay

## 2021-07-27 NOTE — Telephone Encounter (Signed)
Called patient to request a critical illness form to be filled out by our office. Patient stated that she will look when she gets home and if she doesn't have one, she will call [employer]. Patient will send the form through e-mail at [email or web address]  ?

## 2021-07-27 NOTE — Telephone Encounter (Signed)
Patient called about insurance denying coverage from Surgery on 07/14/21 with Dr. Berline Lopes. Patient stated that she has an extra coverage through [employer] that is denying her coverage. Patient received an e-mail denying coverage and I requested patient to send e-mail to [email or web address]. [place] fax number: [phone]. She said she spoke with [employer] and they stated over the phone that "clinical history endometrial carcinoma with foci squamous differentiation doesn't clearly state benign or malignant."  ? ?Patient also stated that Dr. Berline Lopes recommended radiation treatments because it had spread to her cervix. Patient is concerned about the insurance coverage for that as well to determine if it will be invasive or non-invasive. Informed patient someone from our office will be in touch soon. Patient verbalized understanding.  ?

## 2021-08-02 ENCOUNTER — Encounter: Payer: Self-pay | Admitting: Oncology

## 2021-08-02 ENCOUNTER — Other Ambulatory Visit: Payer: Self-pay | Admitting: Oncology

## 2021-08-02 ENCOUNTER — Telehealth: Payer: Self-pay

## 2021-08-02 ENCOUNTER — Telehealth: Payer: Self-pay | Admitting: Radiation Oncology

## 2021-08-02 DIAGNOSIS — C541 Malignant neoplasm of endometrium: Secondary | ICD-10-CM

## 2021-08-02 NOTE — Progress Notes (Signed)
Gynecologic Oncology Multi-Disciplinary Disposition Conference Note ? ?Date of the Conference: 08/02/2021 ? ?Patient Name: Victoria Hunt  ?Referring Provider: Dr. Mardelle Matte ?Primary GYN Oncologist: Dr. Berline Lopes ?Radiation Oncologist: Dr. Sondra Come ? ?Stage/Disposition:  Stage II, grade 1 endometrioid endometrial adenocarcinoma. Disposition is to pelvic radiation therapy with vaginal brachytherapy.  ? ?This Multidisciplinary conference took place involving physicians from [place], Medical Oncology, Radiation Oncology, Pathology, Radiology along with the Gynecologic Oncology Nurse Practitioner and Gynecologic Oncology Nurse Navigator.  Comprehensive assessment of the patient's malignancy, staging, need for surgery, chemotherapy, radiation therapy, and need for further testing were reviewed. Supportive measures, both inpatient and following discharge were also discussed. The recommended plan of care is documented. Greater than 35 minutes were spent correlating and coordinating this patient's care.  ?

## 2021-08-02 NOTE — Telephone Encounter (Signed)
Called patient to schedule a consultation w. Dr. Kinard. No answer, LVM for a return call.  

## 2021-08-02 NOTE — Telephone Encounter (Signed)
Received phone call from Ms. Staniszewski this afternoon. Patient states " I tried to go back to work today but my employer states they need a return to work note and it needs to have my post-operative appointment on it." ? ?Discussed post-operative restrictions with patient of no lifting or straining for 6 weeks over 10 pounds and no pushing, pulling, or straining for 6 weeks. Patient verbalized understanding. She requests the work note be sent to her email [email or web address].  ? ?Work note successfully sent. Instructed patient to call with any needs.  ? ? ? ?

## 2021-08-03 ENCOUNTER — Telehealth: Payer: Self-pay

## 2021-08-03 ENCOUNTER — Encounter: Payer: Self-pay | Admitting: Gynecologic Oncology

## 2021-08-03 NOTE — Progress Notes (Signed)
GYN Location of Tumor / Histology: Endometrial ? ?Victoria Hunt presented with symptoms of: Patient endorses regular menses stopping at age 56.  Her bleeding then became much more infrequent with bleeding episodes happening about every 3 months.  Since April of last year, she describes having spotting intermittently.  Then in November or December 2022, she began having increased frequency of vaginal spotting as well as associated pelvic cramping and back pain.  She still describes the bleeding is quite light when she has it, not occurring daily, sometimes has passage of what appears to be tissue. ? ?Biopsies revealed:  ?A. LYMPH NODE, SENTINEL, RIGHT OBTURATOR, EXCISION:  ?- Lymph node, negative for carcinoma (0/1)  ? ?B. LYMPH NODE, SENTINEL, LEFT INTERNAL ILIAC, EXCISION:  ?- Lymph node, negative for carcinoma (0/1)  ? ?C. 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 ?Past/Anticipated interventions by Gyn/Onc surgery, if any:  ?Operation: Robotic-assisted laparoscopic total hysterectomy with bilateral salpingoophorectomy, SLN biopsy, vaginal laceration repair  ?Surgeon: Jeral Pinch MD ? ?Past/Anticipated interventions by medical oncology, if any: Dr Berline Lopes ?Will present her case at tumor board. Given cervical involvement (pending MMR/MSI testing), discussed pelvic RT +/- vaginal brachytherapy.  ? ?Weight changes, if any: yes, lost 25 pounds in last 2 months ? ?Bowel/Bladder complaints, if any: loose stools, nocturia x 0-1  ? ?Nausea/Vomiting, if any: occasional nausea, doesn't take medication for this ? ?Pain issues, if any:  pain in upper stomach that radiates to back, has  appointment with gastrointerologist ? ?SAFETY ISSUES: ?Prior radiation? no ?Pacemaker/ICD? no ?Possible current pregnancy? no, hysterectomy ?Is the patient on methotrexate? no ? ?Current Complaints / other details:  nothing of note. ? ? ? ?

## 2021-08-03 NOTE — Telephone Encounter (Signed)
Spoke with patient this afternoon. Advised that her critical illness paperwork has been completed but patient needs to sign authorization of release in order for our office to fax the forms to [employer]. ? ?Patient requests the completed forms be emailed to her at [email or web address]. She will sign the forms, email them back to our office and our office with fax the completed forms. (Patient prefers our office fax the documents). ? ? ?

## 2021-08-04 NOTE — Telephone Encounter (Signed)
Received patient's signed sheet; fax cancer policy to [facility]  ?

## 2021-08-06 ENCOUNTER — Inpatient Hospital Stay (HOSPITAL_BASED_OUTPATIENT_CLINIC_OR_DEPARTMENT_OTHER): Payer: No Typology Code available for payment source | Admitting: Gynecologic Oncology

## 2021-08-06 ENCOUNTER — Other Ambulatory Visit: Payer: Self-pay

## 2021-08-06 ENCOUNTER — Encounter: Payer: Self-pay | Admitting: Gynecologic Oncology

## 2021-08-06 VITALS — BP 166/81 | HR 91 | Temp 98.3°F | Resp 16 | Ht 62.99 in | Wt 281.9 lb

## 2021-08-06 DIAGNOSIS — Z90722 Acquired absence of ovaries, bilateral: Secondary | ICD-10-CM

## 2021-08-06 DIAGNOSIS — C541 Malignant neoplasm of endometrium: Secondary | ICD-10-CM

## 2021-08-06 DIAGNOSIS — Z90710 Acquired absence of both cervix and uterus: Secondary | ICD-10-CM

## 2021-08-06 DIAGNOSIS — Z6841 Body Mass Index (BMI) 40.0 and over, adult: Secondary | ICD-10-CM

## 2021-08-06 NOTE — Patient Instructions (Signed)
You are healing very well from surgery.  Remember, no heavy lifting until 6 weeks after surgery and nothing in the vagina for at least 8 weeks. ? ?We will plan to see you back for follow-up after you have completed radiation treatment. ?

## 2021-08-06 NOTE — Progress Notes (Signed)
Gynecologic Oncology Return Clinic Visit ? ?08/06/2021 ? ?Reason for Visit: Follow-up after surgery, treatment planning ? ?Treatment History: ?Oncology History Overview Note  ?MMR IHC intact ? ?Cervix margins negative ?  ?Endometrial cancer ([place])  ?06/13/2021 Imaging  ? CT A/P: ?1. Possible subtle inflammatory changes involving the pancreatic ?head as may be seen with mild or early pancreatitis. Suggest ?correlation with enzymes. ?2. Otherwise no CT evidence for acute intra-abdominal or pelvic ?abnormality. ?3. Heterogenous endometrial stripe thickening up to 24 mm, recommend correlation with pelvic ultrasound which may be performed on a nonemergent basis. ?  ?06/17/2021 Initial Biopsy  ? EMB and removal of a prolapsing polyp through her cervix.  Pathology from this revealed endometrial polyp with complex atypical hyperplasia and extensive squamous metaplasia.  Comment was noted that there are focally back-to-back and cribriform glands concerning for well-differentiated adenocarcinoma although diagnosis of cancer could not be made. ?  ?07/14/2021 Surgery  ? TRH/BSO, SLN biopsy, vaginal laceration repair  ?  ?Findings:  On EUA, 8-10 cm mobile uterus. On intra-abdominal entry, normal upper abdominal survey. Normal omentum, small and large bowel. Uterus 10cm and somewhat globular, normal in appearance. Bilateral adnexa normal in appearance with some evidence on the right fallopian tube (and right uterosacral ligament) either endosalpingiosis or endometriosis. Mapping successful to right obturator and left hypogastric SLN. Some adhesions between bladder and cervix. No intra-abdominal or pelvic evidence of disease. ?  ?07/14/2021 Pathology Results  ? A. LYMPH NODE, SENTINEL, RIGHT OBTURATOR, EXCISION:  ?- Lymph node, negative for carcinoma (0/1)  ? ?B. LYMPH NODE, SENTINEL, LEFT INTERNAL ILIAC, EXCISION:  ?- Lymph node, negative for carcinoma (0/1)  ? ?C. UTERUS, CERVIX, BILATERAL FALLOPIAN TUBES AND OVARIES:  ?- Endometrial  carcinoma with foci of squamous differentiation, FIGO  grade 1  ?- Carcinoma invades for a depth of about 17 mm where myometrial thickness is about 20 mm  ?- Cervical stroma is involved by the tumor  ?- Adenomyosis  ?- Benign unremarkable bilateral fallopian tubes and ovaries  ?- See oncology table  ? ?ONCOLOGY TABLE:  ?UTERUS, CARCINOMA OR CARCINOSARCOMA: Resection  ?Procedure: Total hysterectomy and bilateral salpingo-oophorectomy  ?Histologic Type: Endometrioid carcinoma with foci of squamous  ?differentiation  ?Histologic Grade: FIGO grade 1  ?Myometrial Invasion:  ?     Depth of Myometrial Invasion (mm): 17 mm  ?     Myometrial Thickness (mm): 20 mm  ?     Percentage of Myometrial Invasion: 85%  ?Uterine Serosa Involvement: Not identified  ?Cervical stromal Involvement: Present, anterior cervix  ?Extent of involvement of other tissue/organs: Not identified  ?Peritoneal/Ascitic Fluid: Not applicable  ?Lymphovascular Invasion: Not identified  ?Regional Lymph Nodes:  ?     Pelvic Lymph Nodes Examined:  ?                                 2 Sentinel  ?                                 0 Non-sentinel  ?                                 2 Total  ?     Pelvic Lymph Nodes with Metastasis: 0  ?  Macrometastasis: (>2.0 mm): 0  ?                         Micrometastasis: (>0.2 mm and < 2.0 mm): 0  ?                         Isolated Tumor Cells (<0.2 mm): 0  ?                         Laterality of Lymph Node with Tumor: Not  ?applicable  ?                         Extracapsular Extension: Not applicable  ?     Para-aortic Lymph Nodes Examined:  ?                                  0 Sentinel  ?                                  0 non-sentinel  ?                                  0 total  ?Distant Metastasis:  ?     Distant Site(s) Involved: Not applicable  ?Pathologic Stage Classification (pTNM, AJCC 8th Edition): pT2, pN0  ?Ancillary Studies: MMR / MSI testing will be ordered  ?Representative Tumor Block: H96   ?Comment(s): Pancytokeratin was performed on the lymph nodes and is negative.  ?  ?07/14/2021 Initial Diagnosis  ? Endometrial cancer ([place]) ?  ? ? ?Interval History: ?Doing well since surgery.  Denies any abdominal or pelvic pain.  Denies vaginal bleeding or discharge.  Reports regular bowel and bladder function. ? ?Past Medical/Surgical History: ?Past Medical History:  ?Diagnosis Date  ? Anxiety 04/22/2021  ? Arthritis   ? Breast mass, left   ? Cataract   ? possible  ? Endometrial cancer ([place]) 07/21/2021  ? Endometrial intraepithelial neoplasia (EIN)   ? Hypertension 08/25/2020  ? Morbidly obese ([place])   ? Pre-diabetes   ? ? ?Past Surgical History:  ?Procedure Laterality Date  ? APPENDECTOMY  August 25, 2020  ? CHOLECYSTECTOMY    ? LAPAROSCOPIC APPENDECTOMY N/A 08/25/2020  ? Procedure: APPENDECTOMY LAPAROSCOPIC;  Surgeon: Ralene Ok, MD;  Location: [facility];  Service: General;  Laterality: N/A;  ? ROBOTIC ASSISTED TOTAL HYSTERECTOMY WITH BILATERAL SALPINGO OOPHERECTOMY Bilateral 07/14/2021  ? Procedure: XI ROBOTIC ASSISTED TOTAL HYSTERECTOMY WITH BILATERAL SALPINGO OOPHORECTOMY, VAGINAL TEAR REPAIR;  Surgeon: Lafonda Mosses, MD;  Location: [facility];  Service: Gynecology;  Laterality: Bilateral;  ? SENTINEL NODE BIOPSY N/A 07/14/2021  ? Procedure: SENTINEL NODE BIOPSY;  Surgeon: Lafonda Mosses, MD;  Location: [facility];  Service: Gynecology;  Laterality: N/A;  ? WISDOM TOOTH EXTRACTION    ? ? ?Family History  ?Problem Relation Age of Onset  ? Hyperlipidemia Mother   ? Depression Father   ? Heart disease Father   ? Heart attack Father   ? Aortic aneurysm Father   ? Bipolar disorder Father   ? CVA Maternal Grandmother   ? CVA Maternal Grandfather   ? Depression Paternal Grandmother   ? Myasthenia  gravis Paternal Grandfather   ? Cancer Paternal Great-grandmother   ?     uterine or cervical  ? Breast cancer Neg Hx   ? Colon cancer Neg Hx   ? Pancreatic cancer Neg Hx   ? Prostate cancer Neg Hx   ? ? ?Social History   ? ?Socioeconomic History  ? Marital status: Single  ?  Spouse name: Not on file  ? Number of children: Not on file  ? Years of education: Not on file  ? Highest education level: Not on file  ?Occupational History  ? Occupation: Pharmacist, hospital  ?  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 Smoking status: Never  ?  Passive exposure: Yes  ? Smokeless tobacco: Never  ?Vaping Use  ? Vaping Use: Never used  ?Substance and Sexual Activity  ? Alcohol use: Not Currently  ? Drug use: Never  ? Sexual activity: Not Currently  ?  Birth control/protection: Post-menopausal  ?Other Topics Concern  ? Not on file  ?Social History Narrative  ? 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 ?Social Determinants of Health  ? ?Financial Resource Strain: Not on file  ?Food Insecurity: Not on file  ?Transportation Needs: Not on file  ?Physical Activity: Not on file  ?Stress: Not on file  ?Social Connections: Not on file  ? ? ?Current Medications: ? ?Current Outpatient Medications:  ?  LORazepam (ATIVAN) 0.5 MG tablet, Take 1 tablet (0.5 mg total) by mouth 2 (two) times daily as needed for anxiety or sleep., Disp: 30 tablet, Rfl: 1 ?  propranolol (INDERAL) 10 MG tablet, Take 10 mg by mouth daily in the afternoon., Disp: , Rfl:  ?  sertraline (ZOLOFT) 25 MG tablet, Take 1 tablet (25 mg total) by mouth daily., Disp: 90 tablet, Rfl: 1 ? ?Review of Systems: ?Denies appetite changes, fevers, chills, fatigue, unexplained weight changes. ?Denies hearing loss, neck lumps or masses, mouth sores, ringing in ears or voice changes. ?Denies cough or wheezing.  Denies shortness of breath. ?Denies chest pain or palpitations. Denies leg swelling. ?Denies abdominal distention, pain, blood in stools, constipation, diarrhea, nausea, vomiting, or early satiety. ?Denies pain with intercourse, dysuria, frequency, hematuria or incontinence. ?Denies hot flashes, pelvic pain, vaginal bleeding or vaginal discharge.   ?Denies joint pain, back pain  or muscle pain/cramps. ?Denies itching, rash, or wounds. ?Denies dizziness, headaches, numbness or seizures. ?Denies swollen lymph nodes or glands, denies easy bruising or bleeding. ?Denies anxiety, depre

## 2021-08-10 ENCOUNTER — Encounter: Payer: Self-pay | Admitting: Radiation Oncology

## 2021-08-10 NOTE — Progress Notes (Signed)
?Radiation Oncology         [phone] ?________________________________ ? ?Initial Outpatient Consultation ? ?Name: Victoria Hunt MRN: [id number]  ?Date: 08/11/2021  DOB: [birth year] ? ?CN:[facility], [place], [place]  Lafonda Mosses, MD  ? ?REFERRING PHYSICIAN: Lafonda Mosses, MD ? ?DIAGNOSIS: The encounter diagnosis was Endometrial cancer ([place]). ? 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 ?HISTORY OF PRESENT ILLNESS::Victoria Hunt is a 56 y.o. female who  is seen as a courtesy of Dr. Berline Lopes for an opinion concerning radiation therapy as part of management for her recently diagnosed endometrial cancer.  ? ?The patient presented to the ED on 06/13/21 for evaluation of heart palpations, gradually worsening epigastric pain for a couple of months, trouble eating, and early satiety. CT of the chest abdomen and pelvis performed for evaluation of RLQ pain in the ED incidentally showed a thickened endometrium, measuring up to 24 mm. CT also showed possible subtle inflammatory changes involving the pancreatic head, possibly reflective of mild or early pancreatitis. Otherwise, no acute intra-abdominal or pelvic abnormalities were appreciated.  ? ?Accordingly, the patient followed up with her gynecologist, Dr. Lowella Dell, on 06/16/21 for further evaluation.  During this visit, the patient reported a several year history of abnormal menses, mainly defined by shorter cycles. (Per Dr. Berline Lopes, the patient reported that she had testing performed with her OBGYN confirming her menopausal status). ? ?Subsequently, a endometrial biopsy was collected on 06/17/21 which revealed findings consistent with an endometrial polyp with complex atypical hyperplasia and extensive squamous metaplasia (prolapsing polyp was removed through her cervix at this time). Microscopic comment also indicated presence of back to back and cribriform glands concerning for well differentiated, FIGO grade I  endometrioid adenocarcinoma.  ? ?The patient also met with her PCP, [name] PA, on 06/18/21. During this visit the patient also endorsed discomfort when it comes to eating or even drinking water. She reported that the pain had remained consistent but that she had lost about 5 pounds due to lack of eating. The patient was also again noted to report an abnormal/slow menstrual cycle for approximately 1 year.  ? ?The patient was then referred to Dr. Berline Lopes on 07/02/21 for further management. During this visit, the patient reported regular menses stopping last year. Her bleeding then became much more infrequent with bleeding episodes happening about every 3 months.  Since April of last year, she also endorsed having spotting intermittently.  Then in November - December 2022, she began having increased frequency of vaginal spotting as well as associated pelvic cramping and back pain, and occasional passage of what she presumed to be tissue. Physical exam performed during this visit was negative for any abnormal GU findings.  ? ?Following discussion of the risks and benefits, the patient opted to proceed with total hysterectomy, BSO, and nodal biopsies (and vaginal tear repair) on 07/14/21 under the care of Dr. Berline Lopes. Pathology from the procedure revealed FIGO grade 1 endometrial carcinoma with foci of squamous differentiation (MMR normal & MSI-stable), with 85% myometrial invasion and cervical stroma involvement (final cervical margins negative). Bilateral fallopian tubes and ovaries were otherwise benign. Nodal status of 1/1 right obturator and 1/1 left internal iliac lymph node excisions negative for carcinoma.  ? ?The patient was noted to be doing well post-op during a follow-up visit with Dr. Berline Lopes on 07/21/21. She did report some vaginal bleeding for 24-48 hours after surgery which subsided, and denied any other symptoms. Treatment options discussed with the  patient included pelvic RT +/- vaginal  brachytherapy which the patient voiced agreement to.  Her case was also discussed at tumor board held on 08/02/21, with disposition discussed also for pelvic radiation with vaginal brachytherapy. ? ?Of note: In regards to her palpitations, the patient is seeing a cardiologist and her PCP for this.  She has had a Zio patch placed without obvious pathology. The patient also again presented to the ED on 07/09/21 for palpitations. The patient also reported some right leg pain and swelling which prompted a DVT study that same date which revealed  no evidence of DVT in the right LE.  ? ?Of additional node: the patient underwent diagnostic left breast mammogram and ultrasound on 07/06/21 for evaluation of palpable non-tender left breast lump which revealed a suspicious 1 cm oval mass in the left breast. 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  Diagnostic comment indicated that Ki67 proliferation was higher than expected for this finding, warranting further tissue diagnosis.  ? ?She will be seeing Dr. 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 ? ?PREVIOUS RADIATION THERAPY: No ? ?PAST MEDICAL HISTORY:  ?Past Medical History:  ?Diagnosis Date  ? Anxiety 04/22/2021  ? Arthritis   ? Breast mass, left   ? Cataract   ? possible  ? Endometrial cancer ([place]) 07/21/2021  ? Endometrial intraepithelial neoplasia (EIN)   ? Hypertension 08/25/2020  ? Morbidly obese ([place])   ? Pre-diabetes   ? ? ?PAST SURGICAL HISTORY: ?Past Surgical History:  ?Procedure Laterality Date  ? APPENDECTOMY  August 25, 2020  ? CHOLECYSTECTOMY    ? LAPAROSCOPIC APPENDECTOMY N/A 08/25/2020  ? Procedure: APPENDECTOMY LAPAROSCOPIC;  Surgeon: Ralene Ok, MD;  Location: [facility];  Service: General;  Laterality: N/A;  ? ROBOTIC ASSISTED TOTAL HYSTERECTOMY WITH BILATERAL SALPINGO OOPHERECTOMY Bilateral 07/14/2021  ? Procedure:  XI ROBOTIC ASSISTED TOTAL HYSTERECTOMY WITH BILATERAL SALPINGO OOPHORECTOMY, VAGINAL TEAR REPAIR;  Surgeon: Lafonda Mosses, MD;  [facility];  Service: Gynecology;  Laterality: Bilateral;  ? SENTINEL NODE BIOPSY N/A 07/14/2021  ? Procedure: SENTINEL NODE BIOPSY;  Surgeon: Lafonda Mosses, MD;  [facility];  Service: Gynecology;  Laterality: N/A;  ? WISDOM TOOTH EXTRACTION    ? ? ?FAMILY HISTORY:  ?Family History  ?Problem Relation Age of Onset  ? Hyperlipidemia Mother   ? Depression Father   ? Heart disease Father   ? Heart attack Father   ? Aortic aneurysm Father   ? Bipolar disorder Father   ? CVA Maternal Grandmother   ? CVA Maternal Grandfather   ? Depression Paternal Grandmother   ? Myasthenia gravis Paternal Grandfather   ? Cancer Paternal Great-grandmother   ?     uterine or cervical  ? Breast cancer Neg Hx   ? Colon cancer Neg Hx   ? Pancreatic cancer Neg Hx   ? Prostate cancer Neg Hx   ? ? ?SOCIAL HISTORY:  ?Social History  ? ?Tobacco Use  ? Smoking status: Never  ?  Passive exposure: Yes  ? Smokeless tobacco: Never  ?Vaping Use  ? Vaping Use: Never used  ?Substance Use Topics  ? Alcohol use: Not Currently  ? Drug use: Never  ? ? ?ALLERGIES: No Known Allergies ? ?MEDICATIONS:  ?Current Outpatient Medications  ?Medication Sig Dispense Refill  ? acetaminophen (TYLENOL) 325 MG tablet Take 325 mg by mouth as needed.    ? LORazepam (  ATIVAN) 0.5 MG tablet Take 1 tablet (0.5 mg total) by mouth 2 (two) times daily as needed for anxiety or sleep. 30 tablet 1  ? propranolol (INDERAL) 10 MG tablet Take 10 mg by mouth daily in the afternoon.    ? sertraline (ZOLOFT) 25 MG tablet Take 1 tablet (25 mg total) by mouth daily. 90 tablet 1  ? ?No current facility-administered medications for this encounter.  ? ? ?REVIEW OF SYSTEMS:  A 10+ POINT REVIEW OF SYSTEMS WAS OBTAINED including neurology, dermatology, psychiatry, cardiac, respiratory, lymph, extremities, GI, GU, musculoskeletal, constitutional,  reproductive, HEENT.  She denies any pelvic pain vaginal bleeding or discharge at this time.  She denies any urination difficulties or bowel complaints. ?  ?PHYSICAL EXAM:  height is $RemoveB'5\' 3"'qsaffVbd$  (1.6 m) and weight is 283 lb

## 2021-08-10 NOTE — Progress Notes (Signed)
See MD note for nursing evaluation. °

## 2021-08-11 ENCOUNTER — Ambulatory Visit
Admission: RE | Admit: 2021-08-11 | Discharge: 2021-08-11 | Disposition: A | Discharge status: Home / Self Care | Payer: No Typology Code available for payment source | Source: Ambulatory Visit | Attending: Radiation Oncology | Admitting: Radiation Oncology

## 2021-08-11 ENCOUNTER — Encounter: Payer: Self-pay | Admitting: Gastroenterology

## 2021-08-11 ENCOUNTER — Other Ambulatory Visit: Payer: Self-pay

## 2021-08-11 VITALS — BP 127/82 | HR 75 | Temp 97.8°F | Resp 20 | Ht 63.0 in | Wt 283.0 lb

## 2021-08-11 DIAGNOSIS — R1013 Epigastric pain: Secondary | ICD-10-CM | POA: Diagnosis not present

## 2021-08-11 DIAGNOSIS — I10 Essential (primary) hypertension: Secondary | ICD-10-CM | POA: Diagnosis not present

## 2021-08-11 DIAGNOSIS — Z79899 Other long term (current) drug therapy: Secondary | ICD-10-CM | POA: Diagnosis not present

## 2021-08-11 DIAGNOSIS — C541 Malignant neoplasm of endometrium: Secondary | ICD-10-CM | POA: Diagnosis present

## 2021-08-11 DIAGNOSIS — R6881 Early satiety: Secondary | ICD-10-CM | POA: Insufficient documentation

## 2021-08-11 DIAGNOSIS — Z809 Family history of malignant neoplasm, unspecified: Secondary | ICD-10-CM | POA: Insufficient documentation

## 2021-08-12 ENCOUNTER — Encounter: Payer: Self-pay | Admitting: Radiology

## 2021-08-17 DIAGNOSIS — N390 Urinary tract infection, site not specified: Secondary | ICD-10-CM | POA: Insufficient documentation

## 2021-08-18 ENCOUNTER — Encounter: Payer: Self-pay | Admitting: Radiology

## 2021-08-19 ENCOUNTER — Ambulatory Visit
Admission: RE | Admit: 2021-08-19 | Discharge: 2021-08-19 | Disposition: A | Discharge status: Home / Self Care | Payer: No Typology Code available for payment source | Source: Ambulatory Visit | Attending: Radiation Oncology | Admitting: Radiation Oncology

## 2021-08-19 ENCOUNTER — Other Ambulatory Visit: Payer: Self-pay

## 2021-08-19 DIAGNOSIS — C541 Malignant neoplasm of endometrium: Secondary | ICD-10-CM | POA: Insufficient documentation

## 2021-08-20 ENCOUNTER — Encounter: Payer: Self-pay | Admitting: Gastroenterology

## 2021-08-20 ENCOUNTER — Ambulatory Visit: Payer: No Typology Code available for payment source | Admitting: Gastroenterology

## 2021-08-20 VITALS — BP 110/70 | HR 64 | Ht 63.0 in | Wt 280.0 lb

## 2021-08-20 DIAGNOSIS — R9389 Abnormal findings on diagnostic imaging of other specified body structures: Secondary | ICD-10-CM | POA: Diagnosis not present

## 2021-08-20 DIAGNOSIS — R110 Nausea: Secondary | ICD-10-CM

## 2021-08-20 DIAGNOSIS — R0789 Other chest pain: Secondary | ICD-10-CM | POA: Insufficient documentation

## 2021-08-20 DIAGNOSIS — Z1211 Encounter for screening for malignant neoplasm of colon: Secondary | ICD-10-CM

## 2021-08-20 MED ORDER — PLENVU 140 G PO SOLR
1.0000 | ORAL | 0 refills | Status: DC
Start: 2021-08-20 — End: 2021-10-07

## 2021-08-20 NOTE — Patient Instructions (Signed)
If you are age 54 or younger, your body mass index should be between 19-25. Your Body mass index is 49.6 kg/m?Marland Kitchen If this is out of the aformentioned range listed, please consider follow up with your Primary Care Provider.  ?________________________________________________________ ? ?The [facility] providers would like to encourage you to use [facility] to communicate with providers for non-urgent requests or questions.  Due to long hold times on the telephone, sending your provider a message by [facility] may be a faster and more efficient way to get a response.  Please allow 48 business hours for a response.  Please remember that this is for non-urgent requests.  ?_______________________________________________________ ? ?You have been scheduled for an endoscopy and colonoscopy. Please follow the written instructions given to you at your visit today. ?Please pick up your prep supplies at the pharmacy within the next 1-3 days. ?If you use inhalers (even only as needed), please bring them with you on the day of your procedure. ? ?Follow up pending the results of your Endoscopy/Colonoscopy or as needed. ? ?Thank you for entrusting me with your care and choosing [facility]. ? ?Nicoletta Ba, PA-C ?

## 2021-08-20 NOTE — Progress Notes (Signed)
? ? ? ?08/20/2021 ?Victoria Hunt ?[id number] ?23-Feb-1966 ? ? ?HISTORY OF PRESENT ILLNESS: This is a pleasant 56 year old female who is new to our office.  She has been referred here by [name], PA-C, for evaluation of epigastric abdominal pain and abnormal CT scan of the pancreas.  The patient tells me that in November she began not feeling well.  She says that she when she would eat she began feeling full very quickly.  She then started having some pain in her epigastrium/substernal, behind the xiphoid process area.  Anyways, she ended up going to the emergency department.  CT scan of the abdomen and pelvis with contrast showed possible subtle inflammatory changes involving the pancreatic head that may be seen with early or mild pancreatitis.  Amylase and lipase were normal.  She also had some endometrial stripe thickening.  She followed up with her gynecologist and was found to have endometrial cancer.  She ended up having hysterectomy on February 22 and is scheduled to receive radiation starting on April 10.  She admits that she had been using ibuprofen regularly for quite some time as she had been having some pelvic pain for quite a while.  She has since stopped using the NSAIDs.  She was having some heartburn and reflux type symptoms from November to January or February, but says after her hysterectomy that seemed to resolve so she was not sure if it was hormonal, etc.  She admits that she still gets random nausea during the day and still gets that epigastric/substernal chest pain intermittently as well.  She says that for most part she moves her bowels regularly.  Denies any black or bloody stools.  She has never had a colonoscopy in the past.  No family history of any GI issues to her knowledge.  She also reports a right back/flank pain/discomfort that has been present since her appendectomy in April 2022.  She does not like to take medications unless absolutely necessary. ? ? ?Past Medical History:   ?Diagnosis Date  ? Anxiety 04/22/2021  ? Arthritis   ? Breast mass, left   ? Cataract   ? possible  ? Endometrial cancer ([place]) 07/21/2021  ? Endometrial intraepithelial neoplasia (EIN)   ? Hypertension 08/25/2020  ? Morbidly obese ([place])   ? Pre-diabetes   ? ?Past Surgical History:  ?Procedure Laterality Date  ? APPENDECTOMY  August 25, 2020  ? CHOLECYSTECTOMY    ? LAPAROSCOPIC APPENDECTOMY N/A 08/25/2020  ? Procedure: APPENDECTOMY LAPAROSCOPIC;  Surgeon: Ralene Ok, MD;  Location: [facility];  Service: General;  Laterality: N/A;  ? ROBOTIC ASSISTED TOTAL HYSTERECTOMY WITH BILATERAL SALPINGO OOPHERECTOMY Bilateral 07/14/2021  ? Procedure: XI ROBOTIC ASSISTED TOTAL HYSTERECTOMY WITH BILATERAL SALPINGO OOPHORECTOMY, VAGINAL TEAR REPAIR;  Surgeon: Lafonda Mosses, MD;  Location: [facility];  Service: Gynecology;  Laterality: Bilateral;  ? SENTINEL NODE BIOPSY N/A 07/14/2021  ? Procedure: SENTINEL NODE BIOPSY;  Surgeon: Lafonda Mosses, MD;  Location: [facility];  Service: Gynecology;  Laterality: N/A;  ? WISDOM TOOTH EXTRACTION    ? ? reports that she has never smoked. She has been exposed to tobacco smoke. She has never used smokeless tobacco. She reports that she does not currently use alcohol. She reports that she does not use drugs. ?family history includes Aortic aneurysm in her father; Bipolar disorder in her father; CVA in her maternal grandfather and maternal grandmother; Cancer in her paternal great-grandmother; Depression in her father and paternal grandmother; Heart attack in her father; Heart disease  in her father; Hyperlipidemia in her mother; Myasthenia gravis in her paternal grandfather. ?No Known Allergies ? ?  ?Outpatient Encounter Medications as of 08/20/2021  ?Medication Sig  ? acetaminophen (TYLENOL) 325 MG tablet Take 325 mg by mouth as needed.  ? LORazepam (ATIVAN) 0.5 MG tablet Take 1 tablet (0.5 mg total) by mouth 2 (two) times daily as needed for anxiety or sleep.  ? propranolol (INDERAL) 10 MG  tablet Take 10 mg by mouth daily in the afternoon.  ? sertraline (ZOLOFT) 25 MG tablet Take 1 tablet (25 mg total) by mouth daily.  ? ?No facility-administered encounter medications on file as of 08/20/2021.  ? ? ? ?REVIEW OF SYSTEMS  : All other systems reviewed and negative except where noted in the History of Present Illness. ? ? ?PHYSICAL EXAM: ?BP 110/70   Pulse 64   Ht '5\' 3"'$  (1.6 m)   Wt 280 lb (127 kg)   LMP  (LMP Unknown)   BMI 49.60 kg/m?  ?General: Well developed white female in no acute distress ?Head: Normocephalic and atraumatic ?Eyes:  Sclerae anicteric, conjunctiva pink. ?Ears: Normal auditory acuity ?Lungs: Clear throughout to auscultation; no W/R/R. ?Heart: Regular rate and rhythm; no M/R/G. ?Abdomen: Soft, non-distended.  BS present.  Non-tender. ?Rectal:  Will be done at the time of colonoscopy. ?Musculoskeletal: Symmetrical with no gross deformities  ?Skin: No lesions on visible extremities ?Extremities: No edema  ?Neurological: Alert oriented x 4, grossly non-focal ?Psychological:  Alert and cooperative. Normal mood and affect ? ?ASSESSMENT AND PLAN: ?*Epigastric abdominal pain/atypical chest pain with nausea  Symptoms are intermittent since about November.  She is also having some GERD/reflux, but that resolved after her hysterectomy.  She has also been using NSAIDs in the form of ibuprofen regularly for quite some time.  She had some subtle inflammatory changes involving the pancreatic head on CT scan back in January, but amylase and lipase were normal.  She is now off of NSAIDs.  We will plan for EGD with Dr. Candis Schatz to rule out ulcer, etc. ?*CRC screening: Never had colonoscopy in the past.  We will schedule that with Dr. Candis Schatz as well. ? ? ?**The risks, benefits, and alternatives to EGD and colonoscopy were discussed with the patient and she consents to proceed.  ? ? ?CC:  [name], PA ? ?  ?

## 2021-08-24 NOTE — Progress Notes (Signed)
Agree with the assessment and plan as outlined by Jessica Zehr, PA-C.  Edwyn Inclan E. Amadea Keagy, MD  [facility]  

## 2021-08-25 ENCOUNTER — Encounter: Payer: Self-pay | Admitting: Radiation Oncology

## 2021-08-26 DIAGNOSIS — C541 Malignant neoplasm of endometrium: Secondary | ICD-10-CM | POA: Insufficient documentation

## 2021-08-30 ENCOUNTER — Ambulatory Visit
Admission: RE | Admit: 2021-08-30 | Discharge: 2021-08-30 | Disposition: A | Discharge status: Home / Self Care | Payer: No Typology Code available for payment source | Source: Ambulatory Visit | Attending: Radiation Oncology | Admitting: Radiation Oncology

## 2021-08-30 ENCOUNTER — Other Ambulatory Visit: Payer: Self-pay

## 2021-08-30 DIAGNOSIS — C541 Malignant neoplasm of endometrium: Secondary | ICD-10-CM | POA: Diagnosis not present

## 2021-08-31 ENCOUNTER — Ambulatory Visit
Admission: RE | Admit: 2021-08-31 | Discharge: 2021-08-31 | Disposition: A | Discharge status: Home / Self Care | Payer: No Typology Code available for payment source | Source: Ambulatory Visit | Attending: Radiation Oncology | Admitting: Radiation Oncology

## 2021-08-31 DIAGNOSIS — C541 Malignant neoplasm of endometrium: Secondary | ICD-10-CM | POA: Diagnosis not present

## 2021-08-31 NOTE — Progress Notes (Signed)
Pt here for patient teaching.    Pt given Radiation and You booklet and skin care instructions.    Reviewed areas of pertinence such as diarrhea, fatigue, hair loss in treatment field, nausea and vomiting, sexual and fertility changes, skin changes, and urinary and bladder changes .   Pt able to give teach back of to pat skin, use unscented/gentle soap, use baby wipes, have Imodium on hand, drink plenty of water, and sitz bath,avoid applying anything to skin within 4 hours of treatment.   Pt verbalizes understanding of information given and will contact nursing with any questions or concerns.          

## 2021-09-01 ENCOUNTER — Other Ambulatory Visit: Payer: Self-pay

## 2021-09-01 ENCOUNTER — Ambulatory Visit
Admission: RE | Admit: 2021-09-01 | Discharge: 2021-09-01 | Disposition: A | Discharge status: Home / Self Care | Payer: No Typology Code available for payment source | Source: Ambulatory Visit | Attending: Radiation Oncology | Admitting: Radiation Oncology

## 2021-09-01 DIAGNOSIS — C541 Malignant neoplasm of endometrium: Secondary | ICD-10-CM | POA: Diagnosis not present

## 2021-09-02 ENCOUNTER — Ambulatory Visit
Admission: RE | Admit: 2021-09-02 | Discharge: 2021-09-02 | Disposition: A | Discharge status: Home / Self Care | Payer: No Typology Code available for payment source | Source: Ambulatory Visit | Attending: Radiation Oncology | Admitting: Radiation Oncology

## 2021-09-02 DIAGNOSIS — C541 Malignant neoplasm of endometrium: Secondary | ICD-10-CM | POA: Diagnosis not present

## 2021-09-03 ENCOUNTER — Ambulatory Visit
Admission: RE | Admit: 2021-09-03 | Discharge: 2021-09-03 | Disposition: A | Discharge status: Home / Self Care | Payer: No Typology Code available for payment source | Source: Ambulatory Visit | Attending: Radiation Oncology | Admitting: Radiation Oncology

## 2021-09-03 ENCOUNTER — Other Ambulatory Visit: Payer: Self-pay

## 2021-09-03 DIAGNOSIS — C541 Malignant neoplasm of endometrium: Secondary | ICD-10-CM | POA: Diagnosis not present

## 2021-09-06 ENCOUNTER — Other Ambulatory Visit: Payer: Self-pay

## 2021-09-06 ENCOUNTER — Ambulatory Visit
Admission: RE | Admit: 2021-09-06 | Discharge: 2021-09-06 | Disposition: A | Discharge status: Home / Self Care | Payer: No Typology Code available for payment source | Source: Ambulatory Visit | Attending: Radiation Oncology | Admitting: Radiation Oncology

## 2021-09-06 DIAGNOSIS — C541 Malignant neoplasm of endometrium: Secondary | ICD-10-CM | POA: Diagnosis not present

## 2021-09-07 ENCOUNTER — Ambulatory Visit
Admission: RE | Admit: 2021-09-07 | Discharge: 2021-09-07 | Disposition: A | Discharge status: Home / Self Care | Payer: No Typology Code available for payment source | Source: Ambulatory Visit | Attending: Radiation Oncology | Admitting: Radiation Oncology

## 2021-09-07 ENCOUNTER — Other Ambulatory Visit: Payer: Self-pay

## 2021-09-07 DIAGNOSIS — C541 Malignant neoplasm of endometrium: Secondary | ICD-10-CM | POA: Diagnosis not present

## 2021-09-07 LAB — RAD ONC ARIA SESSION SUMMARY
Course Elapsed Days: 8
Plan Fractions Treated to Date: 7
Plan Prescribed Dose Per Fraction: 1.8 Gy
Plan Total Fractions Prescribed: 25
Plan Total Prescribed Dose: 45 Gy
Reference Point Dosage Given to Date: 12.6 Gy
Reference Point Session Dosage Given: 1.8 Gy
Session Number: 7

## 2021-09-08 ENCOUNTER — Ambulatory Visit
Admission: RE | Admit: 2021-09-08 | Discharge: 2021-09-08 | Disposition: A | Discharge status: Home / Self Care | Payer: No Typology Code available for payment source | Source: Ambulatory Visit | Attending: Radiation Oncology | Admitting: Radiation Oncology

## 2021-09-08 ENCOUNTER — Other Ambulatory Visit: Payer: Self-pay

## 2021-09-08 DIAGNOSIS — C541 Malignant neoplasm of endometrium: Secondary | ICD-10-CM | POA: Diagnosis not present

## 2021-09-08 LAB — RAD ONC ARIA SESSION SUMMARY
Course Elapsed Days: 9
Plan Fractions Treated to Date: 8
Plan Prescribed Dose Per Fraction: 1.8 Gy
Plan Total Fractions Prescribed: 25
Plan Total Prescribed Dose: 45 Gy
Reference Point Dosage Given to Date: 14.4 Gy
Reference Point Session Dosage Given: 1.8 Gy
Session Number: 8

## 2021-09-09 ENCOUNTER — Other Ambulatory Visit: Payer: Self-pay

## 2021-09-09 ENCOUNTER — Ambulatory Visit
Admission: RE | Admit: 2021-09-09 | Discharge: 2021-09-09 | Disposition: A | Discharge status: Home / Self Care | Payer: No Typology Code available for payment source | Source: Ambulatory Visit | Attending: Radiation Oncology | Admitting: Radiation Oncology

## 2021-09-09 DIAGNOSIS — C541 Malignant neoplasm of endometrium: Secondary | ICD-10-CM | POA: Diagnosis not present

## 2021-09-09 LAB — RAD ONC ARIA SESSION SUMMARY
Course Elapsed Days: 10
Plan Fractions Treated to Date: 9
Plan Prescribed Dose Per Fraction: 1.8 Gy
Plan Total Fractions Prescribed: 25
Plan Total Prescribed Dose: 45 Gy
Reference Point Dosage Given to Date: 16.2 Gy
Reference Point Session Dosage Given: 1.8 Gy
Session Number: 9

## 2021-09-10 ENCOUNTER — Ambulatory Visit
Admission: RE | Admit: 2021-09-10 | Discharge: 2021-09-10 | Disposition: A | Discharge status: Home / Self Care | Payer: No Typology Code available for payment source | Source: Ambulatory Visit | Attending: Radiation Oncology | Admitting: Radiation Oncology

## 2021-09-10 ENCOUNTER — Other Ambulatory Visit: Payer: Self-pay

## 2021-09-10 DIAGNOSIS — C541 Malignant neoplasm of endometrium: Secondary | ICD-10-CM | POA: Diagnosis not present

## 2021-09-10 LAB — RAD ONC ARIA SESSION SUMMARY
Course Elapsed Days: 11
Plan Fractions Treated to Date: 10
Plan Prescribed Dose Per Fraction: 1.8 Gy
Plan Total Fractions Prescribed: 25
Plan Total Prescribed Dose: 45 Gy
Reference Point Dosage Given to Date: 18 Gy
Reference Point Session Dosage Given: 1.8 Gy
Session Number: 10

## 2021-09-13 ENCOUNTER — Other Ambulatory Visit: Payer: Self-pay

## 2021-09-13 ENCOUNTER — Ambulatory Visit
Admission: RE | Admit: 2021-09-13 | Discharge: 2021-09-13 | Disposition: A | Discharge status: Home / Self Care | Payer: No Typology Code available for payment source | Source: Ambulatory Visit | Attending: Radiation Oncology | Admitting: Radiation Oncology

## 2021-09-13 DIAGNOSIS — C541 Malignant neoplasm of endometrium: Secondary | ICD-10-CM | POA: Diagnosis not present

## 2021-09-13 LAB — RAD ONC ARIA SESSION SUMMARY
Course Elapsed Days: 14
Plan Fractions Treated to Date: 11
Plan Prescribed Dose Per Fraction: 1.8 Gy
Plan Total Fractions Prescribed: 25
Plan Total Prescribed Dose: 45 Gy
Reference Point Dosage Given to Date: 19.8 Gy
Reference Point Session Dosage Given: 1.8 Gy
Session Number: 11

## 2021-09-14 ENCOUNTER — Ambulatory Visit
Admission: RE | Admit: 2021-09-14 | Discharge: 2021-09-14 | Disposition: A | Discharge status: Home / Self Care | Payer: No Typology Code available for payment source | Source: Ambulatory Visit | Attending: Radiation Oncology | Admitting: Radiation Oncology

## 2021-09-14 ENCOUNTER — Other Ambulatory Visit: Payer: Self-pay

## 2021-09-14 DIAGNOSIS — C541 Malignant neoplasm of endometrium: Secondary | ICD-10-CM | POA: Diagnosis not present

## 2021-09-14 LAB — RAD ONC ARIA SESSION SUMMARY
Course Elapsed Days: 15
Plan Fractions Treated to Date: 12
Plan Prescribed Dose Per Fraction: 1.8 Gy
Plan Total Fractions Prescribed: 25
Plan Total Prescribed Dose: 45 Gy
Reference Point Dosage Given to Date: 21.6 Gy
Reference Point Session Dosage Given: 1.8 Gy
Session Number: 12

## 2021-09-15 ENCOUNTER — Ambulatory Visit
Admission: RE | Admit: 2021-09-15 | Discharge: 2021-09-15 | Disposition: A | Discharge status: Home / Self Care | Payer: No Typology Code available for payment source | Source: Ambulatory Visit | Attending: Radiation Oncology | Admitting: Radiation Oncology

## 2021-09-15 ENCOUNTER — Other Ambulatory Visit: Payer: Self-pay

## 2021-09-15 DIAGNOSIS — C541 Malignant neoplasm of endometrium: Secondary | ICD-10-CM | POA: Diagnosis not present

## 2021-09-15 LAB — RAD ONC ARIA SESSION SUMMARY
Course Elapsed Days: 16
Plan Fractions Treated to Date: 13
Plan Prescribed Dose Per Fraction: 1.8 Gy
Plan Total Fractions Prescribed: 25
Plan Total Prescribed Dose: 45 Gy
Reference Point Dosage Given to Date: 23.4 Gy
Reference Point Session Dosage Given: 1.8 Gy
Session Number: 13

## 2021-09-16 ENCOUNTER — Other Ambulatory Visit: Payer: Self-pay

## 2021-09-16 ENCOUNTER — Ambulatory Visit
Admission: RE | Admit: 2021-09-16 | Discharge: 2021-09-16 | Disposition: A | Discharge status: Home / Self Care | Payer: No Typology Code available for payment source | Source: Ambulatory Visit | Attending: Radiation Oncology | Admitting: Radiation Oncology

## 2021-09-16 DIAGNOSIS — C541 Malignant neoplasm of endometrium: Secondary | ICD-10-CM | POA: Diagnosis not present

## 2021-09-16 LAB — RAD ONC ARIA SESSION SUMMARY
Course Elapsed Days: 17
Plan Fractions Treated to Date: 14
Plan Prescribed Dose Per Fraction: 1.8 Gy
Plan Total Fractions Prescribed: 25
Plan Total Prescribed Dose: 45 Gy
Reference Point Dosage Given to Date: 25.2 Gy
Reference Point Session Dosage Given: 1.8 Gy
Session Number: 14

## 2021-09-17 ENCOUNTER — Other Ambulatory Visit: Payer: Self-pay

## 2021-09-17 ENCOUNTER — Ambulatory Visit: Payer: No Typology Code available for payment source | Admitting: Physician Assistant

## 2021-09-17 ENCOUNTER — Ambulatory Visit
Admission: RE | Admit: 2021-09-17 | Discharge: 2021-09-17 | Disposition: A | Discharge status: Home / Self Care | Payer: No Typology Code available for payment source | Source: Ambulatory Visit | Attending: Radiation Oncology | Admitting: Radiation Oncology

## 2021-09-17 ENCOUNTER — Encounter: Payer: Self-pay | Admitting: Physician Assistant

## 2021-09-17 VITALS — BP 110/62 | HR 83 | Temp 98.4°F | Ht 63.0 in | Wt 276.2 lb

## 2021-09-17 DIAGNOSIS — F4323 Adjustment disorder with mixed anxiety and depressed mood: Secondary | ICD-10-CM | POA: Diagnosis not present

## 2021-09-17 DIAGNOSIS — R002 Palpitations: Secondary | ICD-10-CM | POA: Diagnosis not present

## 2021-09-17 DIAGNOSIS — C541 Malignant neoplasm of endometrium: Secondary | ICD-10-CM | POA: Diagnosis not present

## 2021-09-17 LAB — RAD ONC ARIA SESSION SUMMARY
Course Elapsed Days: 18
Plan Fractions Treated to Date: 15
Plan Prescribed Dose Per Fraction: 1.8 Gy
Plan Total Fractions Prescribed: 25
Plan Total Prescribed Dose: 45 Gy
Reference Point Dosage Given to Date: 27 Gy
Reference Point Session Dosage Given: 1.8 Gy
Session Number: 15

## 2021-09-17 MED ORDER — SERTRALINE HCL 50 MG PO TABS: 50.0000 mg | ORAL_TABLET | Freq: Every day | ORAL | 3 refills | Status: DC

## 2021-09-17 NOTE — Progress Notes (Signed)
Victoria Hunt is a 56 y.o. female here for a follow up of anxiety/depression. ?History of Present Illness:  ? ?Chief Complaint  ?Patient presents with  ? Follow-up  ? Anxiety  ?  Discuss medications  ? ? ?Anxiety/Depression ?Victoria Hunt is currently compliant with taking zoloft 25 mg daily and ativan 0.5 mg twice daily as needed with no adverse effects. Pt expresses that although she has remained compliant she is interested in increasing the dosage, especially while dealing with all her other health concerns.  At this time she is tolerating well. Denies SI/HI.  ? ?Heart Palpitations ?Prior to her total hysterectomy, pt was experiencing these daily despite taking propanolol 10 mg daily. Although this was the case,following her surgery she has not had many episodes. At this time she is managing well.  ? ?Past Medical History:  ?Diagnosis Date  ? Anxiety 04/22/2021  ? Arthritis   ? Breast mass, left   ? Cataract   ? possible  ? Endometrial cancer ([place]) 07/21/2021  ? Endometrial intraepithelial neoplasia (EIN)   ? Hypertension 08/25/2020  ? Morbidly obese ([place])   ? Pre-diabetes   ? ?  ?Social History  ? ?Tobacco Use  ? Smoking status: Never  ?  Passive exposure: Yes  ? Smokeless tobacco: Never  ?Vaping Use  ? Vaping Use: Never used  ?Substance Use Topics  ? Alcohol use: Not Currently  ? Drug use: Never  ? ? ?Past Surgical History:  ?Procedure Laterality Date  ? APPENDECTOMY  August 25, 2020  ? CHOLECYSTECTOMY    ? LAPAROSCOPIC APPENDECTOMY N/A 08/25/2020  ? Procedure: APPENDECTOMY LAPAROSCOPIC;  Surgeon: Ralene Ok, MD;  Location: [facility];  Service: General;  Laterality: N/A;  ? ROBOTIC ASSISTED TOTAL HYSTERECTOMY WITH BILATERAL SALPINGO OOPHERECTOMY Bilateral 07/14/2021  ? Procedure: XI ROBOTIC ASSISTED TOTAL HYSTERECTOMY WITH BILATERAL SALPINGO OOPHORECTOMY, VAGINAL TEAR REPAIR;  Surgeon: Lafonda Mosses, MD;  Location[facility];  Service: Gynecology;  Laterality: Bilateral;  ? SENTINEL NODE BIOPSY N/A 07/14/2021  ?  Procedure: SENTINEL NODE BIOPSY;  Surgeon: Lafonda Mosses, MD;  Location[facility];  Service: Gynecology;  Laterality: N/A;  ? WISDOM TOOTH EXTRACTION    ? ? ?Family History  ?Problem Relation Age of Onset  ? Hyperlipidemia Mother   ? Depression Father   ? Heart disease Father   ? Heart attack Father   ? Aortic aneurysm Father   ? Bipolar disorder Father   ? CVA Maternal Grandmother   ? CVA Maternal Grandfather   ? Depression Paternal Grandmother   ? Myasthenia gravis Paternal Grandfather   ? Cancer Paternal Great-grandmother   ?     uterine or cervical  ? Breast cancer Neg Hx   ? Colon cancer Neg Hx   ? Pancreatic cancer Neg Hx   ? Prostate cancer Neg Hx   ? ? ?No Known Allergies ? ?Current Medications:  ? ?Current Outpatient Medications:  ?  acetaminophen (TYLENOL) 325 MG tablet, Take 325 mg by mouth as needed., Disp: , Rfl:  ?  loperamide (IMODIUM A-D) 2 MG tablet, Take 2 mg by mouth 4 (four) times daily as needed for diarrhea or loose stools., Disp: , Rfl:  ?  LORazepam (ATIVAN) 0.5 MG tablet, Take 1 tablet (0.5 mg total) by mouth 2 (two) times daily as needed for anxiety or sleep., Disp: 30 tablet, Rfl: 1 ?  propranolol (INDERAL) 10 MG tablet, Take 10 mg by mouth daily in the afternoon., Disp: , Rfl:  ?  sertraline (ZOLOFT)  25 MG tablet, Take 1 tablet (25 mg total) by mouth daily., Disp: 90 tablet, Rfl: 1 ?  PEG-KCl-NaCl-NaSulf-Na Asc-C (PLENVU) 140 g SOLR, Take 1 kit by mouth as directed., Disp: 1 each, Rfl: 0  ? ?Review of Systems:  ? ?ROS ?Negative unless otherwise specified per HPI. ?Vitals:  ? ?Vitals:  ? 09/17/21 1529  ?BP: 110/62  ?Pulse: 83  ?Temp: 98.4 ?F (36.9 ?C)  ?TempSrc: Temporal  ?SpO2: 98%  ?Weight: 276 lb 4 oz (125.3 kg)  ?Height: $RemoveB'5\' 3"'DDXLlcJg$  (1.6 m)  ?   ?Body mass index is 48.94 kg/m?. ? ?Physical Exam:  ? ?Physical Exam ?Vitals and nursing note reviewed.  ?Constitutional:   ?   General: She is not in acute distress. ?   Appearance: She is well-developed. She is not ill-appearing or  toxic-appearing.  ?Cardiovascular:  ?   Rate and Rhythm: Normal rate and regular rhythm.  ?   Pulses: Normal pulses.  ?   Heart sounds: Normal heart sounds, S1 normal and S2 normal.  ?Pulmonary:  ?   Effort: Pulmonary effort is normal.  ?   Breath sounds: Normal breath sounds.  ?Skin: ?   General: Skin is warm and dry.  ?Neurological:  ?   Mental Status: She is alert.  ?   GCS: GCS eye subscore is 4. GCS verbal subscore is 5. GCS motor subscore is 6.  ?Psychiatric:     ?   Speech: Speech normal.     ?   Behavior: Behavior normal. Behavior is cooperative.  ? ? ?Assessment and Plan:  ? ?Situational mixed anxiety and depressive disorder ?Improving ?No red flags;Denies SI/HI ?Increase Zoloft to 50 mg daily, maintain ativan 0.5 mg twice daily as needed ?I advised patient that if they develop any SI, to tell someone immediately and seek medical attention ?Follow up in 6 months, sooner if concerns ? ?Palpitations ?Overall improved ?Continue daily propranolol 10 mg  ?Follow-up as needed ? ?I,Havlyn C Ratchford,acting as a scribe for [employer], PA.,have documented all relevant documentation on the behalf of [name], PA,as directed by  [name], PA while in the presence of [name], [place]. ? ?IInda Coke, PA, have reviewed all documentation for this visit. The documentation on 09/17/21 for the exam, diagnosis, procedures, and orders are all accurate and complete. ? ? ?[name], PA-C ? ?

## 2021-09-20 ENCOUNTER — Ambulatory Visit
Admission: RE | Admit: 2021-09-20 | Discharge: 2021-09-20 | Disposition: A | Discharge status: Home / Self Care | Payer: No Typology Code available for payment source | Source: Ambulatory Visit | Attending: Radiation Oncology | Admitting: Radiation Oncology

## 2021-09-20 ENCOUNTER — Other Ambulatory Visit: Payer: Self-pay

## 2021-09-20 DIAGNOSIS — C541 Malignant neoplasm of endometrium: Secondary | ICD-10-CM | POA: Insufficient documentation

## 2021-09-20 LAB — RAD ONC ARIA SESSION SUMMARY
Course Elapsed Days: 21
Plan Fractions Treated to Date: 16
Plan Prescribed Dose Per Fraction: 1.8 Gy
Plan Total Fractions Prescribed: 25
Plan Total Prescribed Dose: 45 Gy
Reference Point Dosage Given to Date: 28.8 Gy
Reference Point Session Dosage Given: 1.8 Gy
Session Number: 16

## 2021-09-21 ENCOUNTER — Ambulatory Visit
Admission: RE | Admit: 2021-09-21 | Discharge: 2021-09-21 | Disposition: A | Discharge status: Home / Self Care | Payer: No Typology Code available for payment source | Source: Ambulatory Visit | Attending: Radiation Oncology | Admitting: Radiation Oncology

## 2021-09-21 ENCOUNTER — Other Ambulatory Visit: Payer: Self-pay

## 2021-09-21 DIAGNOSIS — C541 Malignant neoplasm of endometrium: Secondary | ICD-10-CM | POA: Diagnosis not present

## 2021-09-21 LAB — RAD ONC ARIA SESSION SUMMARY
Course Elapsed Days: 22
Plan Fractions Treated to Date: 17
Plan Prescribed Dose Per Fraction: 1.8 Gy
Plan Total Fractions Prescribed: 25
Plan Total Prescribed Dose: 45 Gy
Reference Point Dosage Given to Date: 30.6 Gy
Reference Point Session Dosage Given: 1.8 Gy
Session Number: 17

## 2021-09-22 ENCOUNTER — Other Ambulatory Visit: Payer: Self-pay

## 2021-09-22 ENCOUNTER — Ambulatory Visit
Admission: RE | Admit: 2021-09-22 | Discharge: 2021-09-22 | Disposition: A | Discharge status: Home / Self Care | Payer: No Typology Code available for payment source | Source: Ambulatory Visit | Attending: Radiation Oncology | Admitting: Radiation Oncology

## 2021-09-22 DIAGNOSIS — C541 Malignant neoplasm of endometrium: Secondary | ICD-10-CM | POA: Diagnosis not present

## 2021-09-22 LAB — RAD ONC ARIA SESSION SUMMARY
Course Elapsed Days: 23
Plan Fractions Treated to Date: 18
Plan Prescribed Dose Per Fraction: 1.8 Gy
Plan Total Fractions Prescribed: 25
Plan Total Prescribed Dose: 45 Gy
Reference Point Dosage Given to Date: 32.4 Gy
Reference Point Session Dosage Given: 1.8 Gy
Session Number: 18

## 2021-09-23 ENCOUNTER — Other Ambulatory Visit: Payer: Self-pay

## 2021-09-23 ENCOUNTER — Ambulatory Visit
Admission: RE | Admit: 2021-09-23 | Discharge: 2021-09-23 | Disposition: A | Discharge status: Home / Self Care | Payer: No Typology Code available for payment source | Source: Ambulatory Visit | Attending: Radiation Oncology | Admitting: Radiation Oncology

## 2021-09-23 DIAGNOSIS — C541 Malignant neoplasm of endometrium: Secondary | ICD-10-CM | POA: Diagnosis not present

## 2021-09-23 LAB — RAD ONC ARIA SESSION SUMMARY
Course Elapsed Days: 24
Plan Fractions Treated to Date: 19
Plan Prescribed Dose Per Fraction: 1.8 Gy
Plan Total Fractions Prescribed: 25
Plan Total Prescribed Dose: 45 Gy
Reference Point Dosage Given to Date: 34.2 Gy
Reference Point Session Dosage Given: 1.8 Gy
Session Number: 19

## 2021-09-24 ENCOUNTER — Other Ambulatory Visit: Payer: Self-pay

## 2021-09-24 ENCOUNTER — Ambulatory Visit
Admission: RE | Admit: 2021-09-24 | Discharge: 2021-09-24 | Disposition: A | Discharge status: Home / Self Care | Payer: No Typology Code available for payment source | Source: Ambulatory Visit | Attending: Radiation Oncology | Admitting: Radiation Oncology

## 2021-09-24 DIAGNOSIS — C541 Malignant neoplasm of endometrium: Secondary | ICD-10-CM | POA: Diagnosis not present

## 2021-09-24 LAB — RAD ONC ARIA SESSION SUMMARY
Course Elapsed Days: 25
Plan Fractions Treated to Date: 20
Plan Prescribed Dose Per Fraction: 1.8 Gy
Plan Total Fractions Prescribed: 25
Plan Total Prescribed Dose: 45 Gy
Reference Point Dosage Given to Date: 36 Gy
Reference Point Session Dosage Given: 1.8 Gy
Session Number: 20

## 2021-09-27 ENCOUNTER — Other Ambulatory Visit: Payer: Self-pay

## 2021-09-27 ENCOUNTER — Encounter: Payer: No Typology Code available for payment source | Admitting: Gastroenterology

## 2021-09-27 ENCOUNTER — Ambulatory Visit
Admission: RE | Admit: 2021-09-27 | Discharge: 2021-09-27 | Disposition: A | Discharge status: Home / Self Care | Payer: No Typology Code available for payment source | Source: Ambulatory Visit | Attending: Radiation Oncology | Admitting: Radiation Oncology

## 2021-09-27 DIAGNOSIS — C541 Malignant neoplasm of endometrium: Secondary | ICD-10-CM | POA: Diagnosis not present

## 2021-09-27 LAB — RAD ONC ARIA SESSION SUMMARY
Course Elapsed Days: 28
Plan Fractions Treated to Date: 21
Plan Prescribed Dose Per Fraction: 1.8 Gy
Plan Total Fractions Prescribed: 25
Plan Total Prescribed Dose: 45 Gy
Reference Point Dosage Given to Date: 37.8 Gy
Reference Point Session Dosage Given: 1.8 Gy
Session Number: 21

## 2021-09-28 ENCOUNTER — Ambulatory Visit
Admission: RE | Admit: 2021-09-28 | Discharge: 2021-09-28 | Disposition: A | Discharge status: Home / Self Care | Payer: No Typology Code available for payment source | Source: Ambulatory Visit | Attending: Radiation Oncology | Admitting: Radiation Oncology

## 2021-09-28 ENCOUNTER — Other Ambulatory Visit: Payer: Self-pay

## 2021-09-28 DIAGNOSIS — C541 Malignant neoplasm of endometrium: Secondary | ICD-10-CM | POA: Diagnosis not present

## 2021-09-28 LAB — RAD ONC ARIA SESSION SUMMARY
Course Elapsed Days: 29
Plan Fractions Treated to Date: 22
Plan Prescribed Dose Per Fraction: 1.8 Gy
Plan Total Fractions Prescribed: 25
Plan Total Prescribed Dose: 45 Gy
Reference Point Dosage Given to Date: 39.6 Gy
Reference Point Session Dosage Given: 1.8 Gy
Session Number: 22

## 2021-09-29 ENCOUNTER — Ambulatory Visit
Admission: RE | Admit: 2021-09-29 | Discharge: 2021-09-29 | Disposition: A | Discharge status: Home / Self Care | Payer: No Typology Code available for payment source | Source: Ambulatory Visit | Attending: Radiation Oncology | Admitting: Radiation Oncology

## 2021-09-29 ENCOUNTER — Other Ambulatory Visit: Payer: Self-pay

## 2021-09-29 DIAGNOSIS — C541 Malignant neoplasm of endometrium: Secondary | ICD-10-CM | POA: Diagnosis not present

## 2021-09-29 LAB — RAD ONC ARIA SESSION SUMMARY
Course Elapsed Days: 30
Plan Fractions Treated to Date: 23
Plan Prescribed Dose Per Fraction: 1.8 Gy
Plan Total Fractions Prescribed: 25
Plan Total Prescribed Dose: 45 Gy
Reference Point Dosage Given to Date: 41.4 Gy
Reference Point Session Dosage Given: 1.8 Gy
Session Number: 23

## 2021-09-30 ENCOUNTER — Ambulatory Visit
Admission: RE | Admit: 2021-09-30 | Discharge: 2021-09-30 | Disposition: A | Discharge status: Home / Self Care | Payer: No Typology Code available for payment source | Source: Ambulatory Visit | Attending: Radiation Oncology | Admitting: Radiation Oncology

## 2021-09-30 ENCOUNTER — Other Ambulatory Visit: Payer: Self-pay

## 2021-09-30 DIAGNOSIS — C541 Malignant neoplasm of endometrium: Secondary | ICD-10-CM | POA: Diagnosis not present

## 2021-09-30 LAB — RAD ONC ARIA SESSION SUMMARY
Course Elapsed Days: 31
Plan Fractions Treated to Date: 24
Plan Prescribed Dose Per Fraction: 1.8 Gy
Plan Total Fractions Prescribed: 25
Plan Total Prescribed Dose: 45 Gy
Reference Point Dosage Given to Date: 43.2 Gy
Reference Point Session Dosage Given: 1.8 Gy
Session Number: 24

## 2021-10-01 ENCOUNTER — Other Ambulatory Visit: Payer: Self-pay

## 2021-10-01 ENCOUNTER — Ambulatory Visit
Admission: RE | Admit: 2021-10-01 | Discharge: 2021-10-01 | Disposition: A | Discharge status: Home / Self Care | Payer: No Typology Code available for payment source | Source: Ambulatory Visit | Attending: Radiation Oncology | Admitting: Radiation Oncology

## 2021-10-01 DIAGNOSIS — C541 Malignant neoplasm of endometrium: Secondary | ICD-10-CM | POA: Diagnosis not present

## 2021-10-01 LAB — RAD ONC ARIA SESSION SUMMARY
Course Elapsed Days: 32
Plan Fractions Treated to Date: 25
Plan Prescribed Dose Per Fraction: 1.8 Gy
Plan Total Fractions Prescribed: 25
Plan Total Prescribed Dose: 45 Gy
Reference Point Dosage Given to Date: 45 Gy
Reference Point Session Dosage Given: 1.8 Gy
Session Number: 25

## 2021-10-04 ENCOUNTER — Ambulatory Visit: Payer: No Typology Code available for payment source

## 2021-10-05 ENCOUNTER — Telehealth: Payer: Self-pay | Admitting: *Deleted

## 2021-10-05 ENCOUNTER — Ambulatory Visit: Payer: No Typology Code available for payment source

## 2021-10-05 NOTE — Telephone Encounter (Signed)
CALLED PATIENT TO REMIND OF APPTS. FOR 10-07-21, LVM FOR A RETURN CALL ?

## 2021-10-06 ENCOUNTER — Ambulatory Visit: Payer: No Typology Code available for payment source

## 2021-10-06 NOTE — Progress Notes (Signed)
Radiation Oncology         [phone] ________________________________  Name: Victoria Hunt MRN: [id number]  Date: 10/07/2021  DOB: [birth year]  Vaginal Brachytherapy Procedure Note  CC: [name], [place] Lafonda Mosses, MD    ICD-10-CM   1. Endometrial cancer ([facility])  C54.1       Diagnosis: The encounter diagnosis was Endometrial cancer ([place]).   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   Since her initial consultation date, the patient met with Dr. Donne Hazel on 08/17/21. During which time, the patient agreed to proceed with a left breast excisional biopsy following completion of pelvic radiation.   Otherwise, no significant interval history since the patient completed IMRT.   She reports her nausea has cleared up since completion of her external beam radiation therapy.  She continues to have some mild problems with diarrhea and will occasionally take an Imodium for this issue.  She denies any vaginal bleeding or urinary symptoms.  ALLERGIES: has No Known Allergies.  Meds: Current Outpatient Medications  Medication Sig Dispense Refill   acetaminophen (TYLENOL) 325 MG tablet Take 325 mg by mouth as needed.     loperamide (IMODIUM A-D) 2 MG tablet Take 2 mg by mouth 4 (four) times daily as needed for diarrhea or loose stools.     LORazepam (ATIVAN) 0.5 MG tablet Take 1 tablet (0.5 mg total) by mouth 2 (two) times daily as needed for anxiety or sleep. 30 tablet 1   sertraline (ZOLOFT) 50 MG tablet Take 1 tablet (50 mg total) by mouth daily. 90 tablet 3   propranolol (INDERAL) 10 MG tablet Take 10 mg by mouth daily in the afternoon. (Patient not taking: Reported on 10/07/2021)     No current facility-administered medications for this encounter.    Physical Findings: The patient is in no acute distress. Patient is alert and oriented.  height is $RemoveB'5\' 3"'FVcxUoia$  (1.6 m) and  weight is 272 lb 2 oz (123.4 kg). Her temporal temperature is 96.7 F (35.9 C) (abnormal). Her blood pressure is 122/74 and her pulse is 74. Her respiration is 18 and oxygen saturation is 100%.   No palpable cervical, supraclavicular or axillary lymphoadenopathy. The heart has a regular rate and rhythm. The lungs are clear to auscultation. Abdomen soft and non-tender.  On pelvic examination the external genitalia were unremarkable. A speculum exam was performed. Vaginal cuff intact, no mucosal lesions. On bimanual exam there were no pelvic masses appreciated.   Lab Findings: Lab Results  Component Value Date   WBC 7.2 07/12/2021   HGB 14.3 07/12/2021   HCT 43.5 07/12/2021   MCV 85.6 07/12/2021   PLT 280.0 07/12/2021    Radiographic Findings: No results found.  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 The patient will be treated with a 2.5 cm diameter cylinder with a treatment length of 3.0 cm. This distended the vaginal vault without undue discomfort. The patient tolerated the procedure well.  The patient was successfully fitted for a vaginal cylinder. The patient is appropriate to begin vaginal brachytherapy.   Plan: The patient will proceed with CT simulation and her first vaginal brachytherapy treatment today.    _______________________________   Blair Promise, PhD, MD  This document serves as a record of services personally performed by Gery Pray, MD. It was created on his behalf  by Roney Mans, a trained medical scribe. The creation of this record is based on the scribe's personal observations and the provider's statements to them. This document has been checked and approved by the attending provider.

## 2021-10-06 NOTE — Progress Notes (Signed)
  Radiation Oncology         [phone] ________________________________  Name: Victoria Hunt MRN: [id number]  Date: 10/07/2021  DOB: Oct 01, 1965  CC: [name], [place]  Lafonda Mosses, MD  HDR BRACHYTHERAPY NOTE  DIAGNOSIS: The encounter diagnosis was Endometrial cancer [facility]).   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 She will be treated with a 2.5 cm diameter segmented cylinder. This conforms to her anatomy without undue discomfort.  Vaginal brachytherapy procedure node: The patient was brought to the [place] suite. Identity was confirmed. All relevant records and images related to the planned course of therapy were reviewed. The patient freely provided informed written consent to proceed with treatment after reviewing the details related to the planned course of therapy. The consent form was witnessed and verified by the simulation staff. Then, the patient was set-up in a stable reproducible supine position for radiation therapy. Pelvic exam revealed the vaginal cuff to be intact . The patient's custom vaginal cylinder was placed in the proximal vagina. This was affixed to the CT/MR stabilization plate to prevent slippage. Patient tolerated the placement well.  Verification simulation note:  A fiducial marker was placed within the vaginal cylinder. An AP and lateral film was then obtained through the pelvis area. This documented accurate position of the vaginal cylinder for treatment.  HDR BRACHYTHERAPY TREATMENT  The remote afterloading device was affixed to the vaginal cylinder by catheter. Patient then proceeded to undergo her first high-dose-rate treatment directed at the proximal vagina. The patient was prescribed a dose of 6.0 gray to be delivered to the mucosal surface. Treatment length was 3.0 cm. Patient was treated with 1 channel using 7  dwell positions. Treatment time was 185.0 seconds. Iridium 192 was the high-dose-rate source for treatment. The patient tolerated the treatment well. After completion of her therapy, a radiation survey was performed documenting return of the iridium source into the GammaMed safe.   PLAN: The patient will return next week for her second high-dose-rate treatment.  ________________________________  -----------------------------------  Blair Promise, PhD, MD  This document serves as a record of services personally performed by Gery Pray, MD. It was created on his behalf by Roney Mans, a trained medical scribe. The creation of this record is based on the scribe's personal observations and the provider's statements to them. This document has been checked and approved by the attending provider.

## 2021-10-07 ENCOUNTER — Ambulatory Visit
Admission: RE | Admit: 2021-10-07 | Discharge: 2021-10-07 | Disposition: A | Discharge status: Home / Self Care | Payer: No Typology Code available for payment source | Source: Ambulatory Visit | Attending: Radiation Oncology | Admitting: Radiation Oncology

## 2021-10-07 ENCOUNTER — Encounter: Payer: Self-pay | Admitting: Radiation Oncology

## 2021-10-07 ENCOUNTER — Other Ambulatory Visit: Payer: Self-pay

## 2021-10-07 DIAGNOSIS — Z79899 Other long term (current) drug therapy: Secondary | ICD-10-CM | POA: Diagnosis not present

## 2021-10-07 DIAGNOSIS — C541 Malignant neoplasm of endometrium: Secondary | ICD-10-CM | POA: Diagnosis present

## 2021-10-07 LAB — RAD ONC ARIA SESSION SUMMARY
Course Elapsed Days: 38
Plan Fractions Treated to Date: 1
Plan Prescribed Dose Per Fraction: 6 Gy
Plan Total Fractions Prescribed: 3
Plan Total Prescribed Dose: 18 Gy
Reference Point Dosage Given to Date: 6 Gy
Reference Point Session Dosage Given: 6 Gy
Session Number: 26

## 2021-10-07 NOTE — Progress Notes (Signed)
AMAURIE SCHRECKENGOST is here today for new Vaginal cylinder.  They completed their  external radiation on: 10/01/21   Does the patient complain of any of the following:  Pain: No Abdominal bloating: No Diarrhea/Constipation: Diarrhea, which is relieved with Imodium.  Nausea/Vomiting: No Vaginal Discharge: No Blood in Urine or Stool: No Urinary Issues (dysuria/incomplete emptying/ incontinence/ increased frequency/urgency): No Post radiation skin changes: Itching  to groin.    Additional comments if applicable:   BP 314/97 (BP Location: Left Wrist, Patient Position: Sitting)   Pulse 74   Temp (!) 96.7 F (35.9 C) (Temporal)   Resp 18   Ht '5\' 3"'$  (1.6 m)   Wt 272 lb 2 oz (123.4 kg)   LMP  (LMP Unknown)   SpO2 100%   BMI 48.20 kg/m

## 2021-10-07 NOTE — Addendum Note (Signed)
Encounter addended by: Gery Pray, MD on: 10/07/2021 4:37 PM  Actions taken: Follow-up modified, Level of Service modified

## 2021-10-08 ENCOUNTER — Telehealth: Payer: Self-pay | Admitting: *Deleted

## 2021-10-08 NOTE — Telephone Encounter (Signed)
CALLED PATIENT TO REMIND OF HDR [place] 10-11-21 @ 10 AM, LVM FOR A RETURN CALL

## 2021-10-08 NOTE — Progress Notes (Signed)
  Radiation Oncology         [phone] ________________________________  Name: Victoria Hunt MRN: [id number]  Date: 10/11/2021  DOB: [birth year]  CC: [name], [place]  Lafonda Mosses, MD  HDR BRACHYTHERAPY NOTE  DIAGNOSIS: The encounter diagnosis was Endometrial cancer [facility]).   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 She will be treated with a 2.5 cm diameter segmented cylinder. This conforms to her anatomy without undue discomfort.  Vaginal brachytherapy procedure node: The patient was brought to the [place] suite. Identity was confirmed. All relevant records and images related to the planned course of therapy were reviewed. The patient freely provided informed written consent to proceed with treatment after reviewing the details related to the planned course of therapy. The consent form was witnessed and verified by the simulation staff. Then, the patient was set-up in a stable reproducible supine position for radiation therapy. Pelvic exam revealed the vaginal cuff to be intact . The patient's custom vaginal cylinder was placed in the proximal vagina. This was affixed to the CT/MR stabilization plate to prevent slippage. Patient tolerated the placement well.  Verification simulation note:  A fiducial marker was placed within the vaginal cylinder. An AP and lateral film was then obtained through the pelvis area. This documented accurate position of the vaginal cylinder for treatment.  HDR BRACHYTHERAPY TREATMENT  The remote afterloading device was affixed to the vaginal cylinder by catheter. Patient then proceeded to undergo her second high-dose-rate treatment directed at the proximal vagina. The patient was prescribed a dose of 6.0 gray to be delivered to the mucosal surface. Treatment length was 3.0 cm. Patient was treated with 1 channel using 7  dwell positions. Treatment time was 192.1 seconds. Iridium 192 was the high-dose-rate source for treatment. The patient tolerated the treatment well. After completion of her therapy, a radiation survey was performed documenting return of the iridium source into the GammaMed safe.   PLAN: The patient will return later this week for her third high-dose-rate treatment.  ________________________________  -----------------------------------  Blair Promise, PhD, MD  This document serves as a record of services personally performed by Gery Pray, MD. It was created on his behalf by Roney Mans, a trained medical scribe. The creation of this record is based on the scribe's personal observations and the provider's statements to them. This document has been checked and approved by the attending provider.

## 2021-10-11 ENCOUNTER — Ambulatory Visit
Admission: RE | Admit: 2021-10-11 | Discharge: 2021-10-11 | Disposition: A | Discharge status: Home / Self Care | Payer: No Typology Code available for payment source | Source: Ambulatory Visit | Attending: Radiation Oncology | Admitting: Radiation Oncology

## 2021-10-11 ENCOUNTER — Other Ambulatory Visit: Payer: Self-pay

## 2021-10-11 DIAGNOSIS — C541 Malignant neoplasm of endometrium: Secondary | ICD-10-CM | POA: Diagnosis not present

## 2021-10-11 LAB — RAD ONC ARIA SESSION SUMMARY
Course Elapsed Days: 42
Plan Fractions Treated to Date: 2
Plan Prescribed Dose Per Fraction: 6 Gy
Plan Total Fractions Prescribed: 3
Plan Total Prescribed Dose: 18 Gy
Reference Point Dosage Given to Date: 12 Gy
Reference Point Session Dosage Given: 6 Gy
Session Number: 27

## 2021-10-13 ENCOUNTER — Telehealth: Payer: Self-pay | Admitting: *Deleted

## 2021-10-13 NOTE — Progress Notes (Signed)
  Radiation Oncology         [phone] ________________________________  Name: Victoria Hunt MRN: [id number]  Date: 10/14/2021  DOB: 11-Aug-1965  CC: [name], [place]  Lafonda Mosses, MD  HDR BRACHYTHERAPY NOTE  DIAGNOSIS: The encounter diagnosis was Endometrial cancer [facility]   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 She will be treated with a 2.5 cm diameter segmented cylinder. This conforms to her anatomy without undue discomfort.  Vaginal brachytherapy procedure node: The patient was brought to the [facility]. Identity was confirmed. All relevant records and images related to the planned course of therapy were reviewed. The patient freely provided informed written consent to proceed with treatment after reviewing the details related to the planned course of therapy. The consent form was witnessed and verified by the simulation staff. Then, the patient was set-up in a stable reproducible supine position for radiation therapy. Pelvic exam revealed the vaginal cuff to be intact . The patient's custom vaginal cylinder was placed in the proximal vagina. This was affixed to the CT/MR stabilization plate to prevent slippage. Patient tolerated the placement well.  Verification simulation note:  A fiducial marker was placed within the vaginal cylinder. An AP and lateral film was then obtained through the pelvis area. This documented accurate position of the vaginal cylinder for treatment.  HDR BRACHYTHERAPY TREATMENT  The remote afterloading device was affixed to the vaginal cylinder by catheter. Patient then proceeded to undergo her third high-dose-rate treatment directed at the proximal vagina. The patient was prescribed a dose of 6.0 gray to be delivered to the mucosal surface. Treatment length was 3.0 cm. Patient was treated with 1 channel using 7  dwell positions. Treatment time was 197.8 seconds. Iridium 192 was the high-dose-rate source for treatment. The patient tolerated the treatment well. After completion of her therapy, a radiation survey was performed documenting return of the iridium source into the GammaMed safe.   PLAN: She has completed her planned course of adjuvant treatment.  Routine follow-up in 1 month. ________________________________  -----------------------------------  Blair Promise, PhD, MD  This document serves as a record of services personally performed by Gery Pray, MD. It was created on his behalf by Roney Mans, a trained medical scribe. The creation of this record is based on the scribe's personal observations and the provider's statements to them. This document has been checked and approved by the attending provider.

## 2021-10-13 NOTE — Telephone Encounter (Signed)
CALLED PATIENT TO REMIND OF HDR Reading 10-14-21 @ 3 PM, LVM FOR A RETURN CALL

## 2021-10-14 ENCOUNTER — Ambulatory Visit
Admission: RE | Admit: 2021-10-14 | Discharge: 2021-10-14 | Disposition: A | Discharge status: Home / Self Care | Payer: No Typology Code available for payment source | Source: Ambulatory Visit | Attending: Radiation Oncology | Admitting: Radiation Oncology

## 2021-10-14 ENCOUNTER — Other Ambulatory Visit: Payer: Self-pay

## 2021-10-14 ENCOUNTER — Encounter: Payer: Self-pay | Admitting: Radiation Oncology

## 2021-10-14 DIAGNOSIS — C541 Malignant neoplasm of endometrium: Secondary | ICD-10-CM

## 2021-10-14 LAB — RAD ONC ARIA SESSION SUMMARY
Course Elapsed Days: 45
Plan Fractions Treated to Date: 3
Plan Prescribed Dose Per Fraction: 6 Gy
Plan Total Fractions Prescribed: 3
Plan Total Prescribed Dose: 18 Gy
Reference Point Dosage Given to Date: 18 Gy
Reference Point Session Dosage Given: 6 Gy
Session Number: 28

## 2021-10-25 ENCOUNTER — Encounter (HOSPITAL_BASED_OUTPATIENT_CLINIC_OR_DEPARTMENT_OTHER): Payer: Self-pay | Admitting: General Surgery

## 2021-10-25 ENCOUNTER — Other Ambulatory Visit: Payer: Self-pay

## 2021-10-27 ENCOUNTER — Other Ambulatory Visit: Payer: Self-pay | Admitting: General Surgery

## 2021-10-27 DIAGNOSIS — N6320 Unspecified lump in the left breast, unspecified quadrant: Secondary | ICD-10-CM

## 2021-11-01 MED ORDER — ENSURE PRE-SURGERY PO LIQD: 296.0000 mL | Freq: Once | ORAL | Status: DC

## 2021-11-01 NOTE — Progress Notes (Addendum)
Sent text reminding pt to come pick up pre surgery drink and soap.   Surgical soap given with instructions, pt verbalized understanding. Enhanced Recovery after Surgery  Enhanced Recovery after Surgery is a protocol used to improve the stress on your body and your recovery after surgery.  Patient Instructions  The night before surgery:  No food after midnight. ONLY clear liquids after midnight  The day of surgery (if you do NOT have diabetes):  Drink ONE (1) Pre-Surgery Clear Ensure as directed.   This drink was given to you during your hospital  pre-op appointment visit. The pre-op nurse will instruct you on the time to drink the  Pre-Surgery Ensure depending on your surgery time. Finish the drink at the designated time by the pre-op nurse.  Nothing else to drink after completing the  Pre-Surgery Clear Ensure.  The day of surgery (if you have diabetes): Drink ONE (1) Gatorade 2 (G2) as directed. This drink was given to you during your hospital  pre-op appointment visit.  The pre-op nurse will instruct you on the time to drink the   Gatorade 2 (G2) depending on your surgery time. Color of the Gatorade may vary. Red is not allowed. Nothing else to drink after completing the  Gatorade 2 (G2).         If office.you have questions, please contact your surgeon's office

## 2021-11-02 ENCOUNTER — Other Ambulatory Visit: Payer: Self-pay

## 2021-11-02 ENCOUNTER — Ambulatory Visit (HOSPITAL_BASED_OUTPATIENT_CLINIC_OR_DEPARTMENT_OTHER): Payer: No Typology Code available for payment source | Admitting: Certified Registered"

## 2021-11-02 ENCOUNTER — Ambulatory Visit (HOSPITAL_BASED_OUTPATIENT_CLINIC_OR_DEPARTMENT_OTHER)
Admission: RE | Admit: 2021-11-02 | Discharge: 2021-11-02 | Disposition: A | Discharge status: Home / Self Care | Payer: No Typology Code available for payment source | Attending: General Surgery | Admitting: General Surgery

## 2021-11-02 ENCOUNTER — Encounter (HOSPITAL_BASED_OUTPATIENT_CLINIC_OR_DEPARTMENT_OTHER): Payer: Self-pay | Admitting: General Surgery

## 2021-11-02 ENCOUNTER — Encounter (HOSPITAL_BASED_OUTPATIENT_CLINIC_OR_DEPARTMENT_OTHER)
Admission: RE | Disposition: A | Discharge status: Home / Self Care | Payer: Self-pay | Source: Home / Self Care | Attending: General Surgery

## 2021-11-02 DIAGNOSIS — N6321 Unspecified lump in the left breast, upper outer quadrant: Secondary | ICD-10-CM | POA: Insufficient documentation

## 2021-11-02 DIAGNOSIS — I10 Essential (primary) hypertension: Secondary | ICD-10-CM

## 2021-11-02 DIAGNOSIS — Z6841 Body Mass Index (BMI) 40.0 and over, adult: Secondary | ICD-10-CM | POA: Diagnosis not present

## 2021-11-02 DIAGNOSIS — N6320 Unspecified lump in the left breast, unspecified quadrant: Secondary | ICD-10-CM | POA: Diagnosis not present

## 2021-11-02 DIAGNOSIS — Z01818 Encounter for other preprocedural examination: Secondary | ICD-10-CM

## 2021-11-02 DIAGNOSIS — C541 Malignant neoplasm of endometrium: Secondary | ICD-10-CM | POA: Insufficient documentation

## 2021-11-02 DIAGNOSIS — Z923 Personal history of irradiation: Secondary | ICD-10-CM | POA: Insufficient documentation

## 2021-11-02 HISTORY — PX: RADIOACTIVE SEED GUIDED EXCISIONAL BREAST BIOPSY: SHX6490

## 2021-11-02 SURGERY — RADIOACTIVE SEED GUIDED BREAST BIOPSY
Anesthesia: General | Site: Breast | Laterality: Left

## 2021-11-02 MED ORDER — PROPOFOL 500 MG/50ML IV EMUL
INTRAVENOUS | Status: AC
  Filled 2021-11-02: qty 50

## 2021-11-02 MED ORDER — CEFAZOLIN IN SODIUM CHLORIDE 3-0.9 GM/100ML-% IV SOLN
INTRAVENOUS | Status: AC
  Filled 2021-11-02: qty 100

## 2021-11-02 MED ORDER — DEXAMETHASONE SODIUM PHOSPHATE 10 MG/ML IJ SOLN
INTRAMUSCULAR | Status: AC
  Filled 2021-11-02: qty 1

## 2021-11-02 MED ORDER — KETOROLAC TROMETHAMINE 30 MG/ML IJ SOLN
INTRAMUSCULAR | Status: AC
  Filled 2021-11-02: qty 1

## 2021-11-02 MED ORDER — EPHEDRINE SULFATE (PRESSORS) 50 MG/ML IJ SOLN
INTRAMUSCULAR | Status: DC | PRN
  Administered 2021-11-02: 10 mg via INTRAVENOUS

## 2021-11-02 MED ORDER — BUPIVACAINE HCL (PF) 0.25 % IJ SOLN
INTRAMUSCULAR | Status: AC
  Filled 2021-11-02: qty 30

## 2021-11-02 MED ORDER — FENTANYL CITRATE (PF) 100 MCG/2ML IJ SOLN
INTRAMUSCULAR | Status: DC | PRN
  Administered 2021-11-02: 50 ug via INTRAVENOUS

## 2021-11-02 MED ORDER — ACETAMINOPHEN 500 MG PO TABS
ORAL_TABLET | ORAL | Status: AC
  Filled 2021-11-02: qty 2

## 2021-11-02 MED ORDER — LIDOCAINE HCL (CARDIAC) PF 100 MG/5ML IV SOSY
PREFILLED_SYRINGE | INTRAVENOUS | Status: DC | PRN
  Administered 2021-11-02: 100 mg via INTRAVENOUS

## 2021-11-02 MED ORDER — DEXAMETHASONE SODIUM PHOSPHATE 4 MG/ML IJ SOLN
INTRAMUSCULAR | Status: DC | PRN
  Administered 2021-11-02: 10 mg via INTRAVENOUS

## 2021-11-02 MED ORDER — MIDAZOLAM HCL 2 MG/2ML IJ SOLN
INTRAMUSCULAR | Status: AC
  Filled 2021-11-02: qty 2

## 2021-11-02 MED ORDER — PROPOFOL 10 MG/ML IV BOLUS
INTRAVENOUS | Status: DC | PRN
  Administered 2021-11-02: 200 mg via INTRAVENOUS

## 2021-11-02 MED ORDER — FENTANYL CITRATE (PF) 100 MCG/2ML IJ SOLN: 25.0000 ug | INTRAMUSCULAR | Status: DC | PRN

## 2021-11-02 MED ORDER — CHLORHEXIDINE GLUCONATE CLOTH 2 % EX PADS: 6.0000 | MEDICATED_PAD | Freq: Once | CUTANEOUS | Status: DC

## 2021-11-02 MED ORDER — FENTANYL CITRATE (PF) 100 MCG/2ML IJ SOLN
INTRAMUSCULAR | Status: AC
  Filled 2021-11-02: qty 2

## 2021-11-02 MED ORDER — OXYCODONE HCL 5 MG PO TABS: 5.0000 mg | ORAL_TABLET | Freq: Once | ORAL | Status: DC | PRN

## 2021-11-02 MED ORDER — ONDANSETRON HCL 4 MG/2ML IJ SOLN: 4.0000 mg | Freq: Once | INTRAMUSCULAR | Status: DC | PRN

## 2021-11-02 MED ORDER — BUPIVACAINE HCL (PF) 0.25 % IJ SOLN
INTRAMUSCULAR | Status: DC | PRN
  Administered 2021-11-02: 10 mL

## 2021-11-02 MED ORDER — AMISULPRIDE (ANTIEMETIC) 5 MG/2ML IV SOLN
10.0000 mg | Freq: Once | INTRAVENOUS | Status: DC | PRN
Start: 2021-11-02 — End: 2021-11-02

## 2021-11-02 MED ORDER — LACTATED RINGERS IV SOLN: INTRAVENOUS | Status: DC

## 2021-11-02 MED ORDER — CELECOXIB 200 MG PO CAPS
400.0000 mg | ORAL_CAPSULE | ORAL | Status: AC
  Administered 2021-11-02: 400 mg via ORAL

## 2021-11-02 MED ORDER — LIDOCAINE 2% (20 MG/ML) 5 ML SYRINGE
INTRAMUSCULAR | Status: AC
  Filled 2021-11-02: qty 5

## 2021-11-02 MED ORDER — CELECOXIB 200 MG PO CAPS
ORAL_CAPSULE | ORAL | Status: AC
  Filled 2021-11-02: qty 2

## 2021-11-02 MED ORDER — OXYCODONE HCL 5 MG/5ML PO SOLN: 5.0000 mg | Freq: Once | ORAL | Status: DC | PRN

## 2021-11-02 MED ORDER — ONDANSETRON HCL 4 MG/2ML IJ SOLN
INTRAMUSCULAR | Status: DC | PRN
  Administered 2021-11-02: 4 mg via INTRAVENOUS

## 2021-11-02 MED ORDER — CEFAZOLIN IN SODIUM CHLORIDE 3-0.9 GM/100ML-% IV SOLN
3.0000 g | INTRAVENOUS | Status: AC
  Administered 2021-11-02: 3 g via INTRAVENOUS

## 2021-11-02 MED ORDER — ONDANSETRON HCL 4 MG/2ML IJ SOLN
INTRAMUSCULAR | Status: AC
  Filled 2021-11-02: qty 2

## 2021-11-02 MED ORDER — MIDAZOLAM HCL 5 MG/5ML IJ SOLN
INTRAMUSCULAR | Status: DC | PRN
  Administered 2021-11-02: 2 mg via INTRAVENOUS

## 2021-11-02 MED ORDER — ACETAMINOPHEN 500 MG PO TABS
1000.0000 mg | ORAL_TABLET | ORAL | Status: AC
  Administered 2021-11-02: 1000 mg via ORAL

## 2021-11-02 SURGICAL SUPPLY — 56 items
ADH SKN CLS APL DERMABOND .7 (GAUZE/BANDAGES/DRESSINGS) ×1
APL PRP STRL LF DISP 70% ISPRP (MISCELLANEOUS) ×1
APPLIER CLIP 9.375 MED OPEN (MISCELLANEOUS)
APR CLP MED 9.3 20 MLT OPN (MISCELLANEOUS)
BINDER BREAST LRG (GAUZE/BANDAGES/DRESSINGS) IMPLANT
BINDER BREAST MEDIUM (GAUZE/BANDAGES/DRESSINGS) IMPLANT
BINDER BREAST XLRG (GAUZE/BANDAGES/DRESSINGS) IMPLANT
BINDER BREAST XXLRG (GAUZE/BANDAGES/DRESSINGS) IMPLANT
BLADE SURG 15 STRL LF DISP TIS (BLADE) ×1 IMPLANT
BLADE SURG 15 STRL SS (BLADE) ×2
CANISTER SUC SOCK COL 7IN (MISCELLANEOUS) IMPLANT
CANISTER SUCT 1200ML W/VALVE (MISCELLANEOUS) IMPLANT
CHLORAPREP W/TINT 26 (MISCELLANEOUS) ×2 IMPLANT
CLIP APPLIE 9.375 MED OPEN (MISCELLANEOUS) IMPLANT
CLIP TI WIDE RED SMALL 6 (CLIP) IMPLANT
COVER BACK TABLE 60X90IN (DRAPES) ×2 IMPLANT
COVER MAYO STAND STRL (DRAPES) ×2 IMPLANT
COVER PROBE W GEL 5X96 (DRAPES) ×2 IMPLANT
DERMABOND ADVANCED (GAUZE/BANDAGES/DRESSINGS) ×1
DERMABOND ADVANCED .7 DNX12 (GAUZE/BANDAGES/DRESSINGS) ×1 IMPLANT
DRAPE LAPAROSCOPIC ABDOMINAL (DRAPES) ×2 IMPLANT
DRAPE UTILITY XL STRL (DRAPES) ×2 IMPLANT
DRSG TEGADERM 4X4.75 (GAUZE/BANDAGES/DRESSINGS) IMPLANT
ELECT COATED BLADE 2.86 ST (ELECTRODE) ×2 IMPLANT
ELECT REM PT RETURN 9FT ADLT (ELECTROSURGICAL) ×2
ELECTRODE REM PT RTRN 9FT ADLT (ELECTROSURGICAL) ×1 IMPLANT
GAUZE SPONGE 4X4 12PLY STRL LF (GAUZE/BANDAGES/DRESSINGS) IMPLANT
GLOVE BIO SURGEON STRL SZ7 (GLOVE) ×4 IMPLANT
GLOVE BIOGEL PI IND STRL 7.5 (GLOVE) ×1 IMPLANT
GLOVE BIOGEL PI INDICATOR 7.5 (GLOVE) ×1
GOWN STRL REUS W/ TWL LRG LVL3 (GOWN DISPOSABLE) ×2 IMPLANT
GOWN STRL REUS W/TWL LRG LVL3 (GOWN DISPOSABLE) ×4
HEMOSTAT ARISTA ABSORB 3G PWDR (HEMOSTASIS) IMPLANT
KIT MARKER MARGIN INK (KITS) ×2 IMPLANT
NDL HYPO 25X1 1.5 SAFETY (NEEDLE) ×1 IMPLANT
NEEDLE HYPO 25X1 1.5 SAFETY (NEEDLE) ×2 IMPLANT
NS IRRIG 1000ML POUR BTL (IV SOLUTION) IMPLANT
PACK BASIN DAY SURGERY FS (CUSTOM PROCEDURE TRAY) ×2 IMPLANT
PENCIL SMOKE EVACUATOR (MISCELLANEOUS) ×2 IMPLANT
RETRACTOR ONETRAX LX 90X20 (MISCELLANEOUS) IMPLANT
SLEEVE SCD COMPRESS KNEE MED (STOCKING) ×2 IMPLANT
SPIKE FLUID TRANSFER (MISCELLANEOUS) IMPLANT
SPONGE T-LAP 4X18 ~~LOC~~+RFID (SPONGE) ×2 IMPLANT
STRIP CLOSURE SKIN 1/2X4 (GAUZE/BANDAGES/DRESSINGS) ×2 IMPLANT
SUT MNCRL AB 4-0 PS2 18 (SUTURE) IMPLANT
SUT MON AB 5-0 PS2 18 (SUTURE) IMPLANT
SUT SILK 2 0 SH (SUTURE) IMPLANT
SUT VIC AB 2-0 SH 27 (SUTURE) ×2
SUT VIC AB 2-0 SH 27XBRD (SUTURE) ×1 IMPLANT
SUT VIC AB 3-0 SH 27 (SUTURE) ×2
SUT VIC AB 3-0 SH 27X BRD (SUTURE) ×1 IMPLANT
SYR CONTROL 10ML LL (SYRINGE) ×2 IMPLANT
TOWEL GREEN STERILE FF (TOWEL DISPOSABLE) ×2 IMPLANT
TRAY FAXITRON CT DISP (TRAY / TRAY PROCEDURE) ×2 IMPLANT
TUBE CONNECTING 20X1/4 (TUBING) IMPLANT
YANKAUER SUCT BULB TIP NO VENT (SUCTIONS) IMPLANT

## 2021-11-02 NOTE — Anesthesia Procedure Notes (Signed)
Procedure Name: LMA Insertion Date/Time: 11/02/2021 7:36 AM  Performed by: Tawni Millers, CRNAPre-anesthesia Checklist: Patient identified, Emergency Drugs available, Suction available and Patient being monitored Patient Re-evaluated:Patient Re-evaluated prior to induction Oxygen Delivery Method: Circle system utilized Preoxygenation: Pre-oxygenation with 100% oxygen Induction Type: IV induction Ventilation: Mask ventilation without difficulty LMA: LMA inserted LMA Size: 3.0 Number of attempts: 1 Airway Equipment and Method: Bite block Placement Confirmation: positive ETCO2 Tube secured with: Tape Dental Injury: Teeth and Oropharynx as per pre-operative assessment

## 2021-11-02 NOTE — Anesthesia Postprocedure Evaluation (Signed)
Anesthesia Post Note  Patient: Victoria Hunt  Procedure(s) Performed: RADIOACTIVE SEED GUIDED EXCISIONAL LEFT BREAST BIOPSY (Left: Breast)     Patient location during evaluation: PACU Anesthesia Type: General Level of consciousness: awake and alert Pain management: pain level controlled Vital Signs Assessment: post-procedure vital signs reviewed and stable Respiratory status: spontaneous breathing, nonlabored ventilation and respiratory function stable Cardiovascular status: blood pressure returned to baseline and stable Postop Assessment: no apparent nausea or vomiting Anesthetic complications: no   No notable events documented.  Last Vitals:  Vitals:   11/02/21 0830 11/02/21 0858  BP: (!) 157/95 (!) 146/83  Pulse: 69 66  Resp: 16 20  Temp:  (!) 36.4 C  SpO2: 97% 99%    Last Pain:  Vitals:   11/02/21 0858  TempSrc: Oral  PainSc: 0-No pain                 Lidia Collum

## 2021-11-02 NOTE — Anesthesia Preprocedure Evaluation (Addendum)
Anesthesia Evaluation  Patient identified by MRN, date of birth, ID band Patient awake    Reviewed: Allergy & Precautions, NPO status , Patient's Chart, lab work & pertinent test results  History of Anesthesia Complications Negative for: history of anesthetic complications  Airway Mallampati: II  TM Distance: >3 FB Neck ROM: Full    Dental  (+) Dental Advisory Given, Teeth Intact   Pulmonary neg pulmonary ROS,    Pulmonary exam normal        Cardiovascular hypertension, Normal cardiovascular exam     Neuro/Psych Anxiety negative neurological ROS     GI/Hepatic negative GI ROS, Neg liver ROS,   Endo/Other  diabetes (Pre-DM\)Morbid obesity  Renal/GU negative Renal ROS  negative genitourinary   Musculoskeletal negative musculoskeletal ROS (+)   Abdominal   Peds  Hematology negative hematology ROS (+)   Anesthesia Other Findings Left breast mass H/o endometrial cancer s/p hysterectomy  Reproductive/Obstetrics                            Anesthesia Physical Anesthesia Plan  ASA: 3  Anesthesia Plan: General   Post-op Pain Management: Tylenol PO (pre-op)* and Celebrex PO (pre-op)*   Induction: Intravenous  PONV Risk Score and Plan: 3 and Ondansetron, Dexamethasone, Midazolam and Treatment may vary due to age or medical condition  Airway Management Planned: LMA  Additional Equipment: None  Intra-op Plan:   Post-operative Plan: Extubation in OR  Informed Consent: I have reviewed the patients History and Physical, chart, labs and discussed the procedure including the risks, benefits and alternatives for the proposed anesthesia with the patient or authorized representative who has indicated his/her understanding and acceptance.     Dental advisory given  Plan Discussed with:   Anesthesia Plan Comments:         Anesthesia Quick Evaluation

## 2021-11-02 NOTE — Discharge Instructions (Addendum)
[place] Office Phone Number [phone]  POST OP INSTRUCTIONS   Take 400 mg of ibuprofen every 8 hours or 650 mg tylenol every 6 hours for next 72 hours then as needed. Use ice several times daily also. Take your usually prescribed medications unless otherwise directed If you need a refill on your pain medication, please contact your pharmacy.  They will contact our office to request authorization.  Prescriptions will not be filled after 5pm or on week-ends. You should eat very light the first 24 hours after surgery, such as soup, crackers, pudding, etc.  Resume your normal diet the day after surgery. Most patients will experience some swelling and bruising in the breast.  Ice packs and a good support bra will help.  Wear the breast binder provided or a sports bra for 72 hours day and night.  After that wear a sports bra during the day until you return to the office. Swelling and bruising can take several days to resolve.  It is common to experience some constipation if taking pain medication after surgery.  Increasing fluid intake and taking a stool softener will usually help or prevent this problem from occurring.  A mild laxative (Milk of Magnesia or Miralax) should be taken according to package directions if there are no bowel movements after 48 hours. I used skin glue on the incision, you may shower in 24 hours.  The glue will flake off over the next 2-3 weeks.  Any sutures or staples will be removed at the office during your follow-up visit. ACTIVITIES:  You may resume regular daily activities (gradually increasing) beginning the next day.  Wearing a good support bra or sports bra minimizes pain and swelling.  You may have sexual intercourse when it is comfortable. You may drive when you no longer are taking prescription pain medication, you can comfortably wear a seatbelt, and you can safely maneuver your car and apply brakes. RETURN TO WORK:   ______________________________________________________________________________________ Dennis Bast should see your doctor in the office for a follow-up appointment approximately two weeks after your surgery.  Your doctor's nurse will typically make your follow-up appointment when she calls you with your pathology report.  Expect your pathology report 3-4 business days after your surgery.  You may call to check if you do not hear from [place] after three days. OTHER INSTRUCTIONS: _______________________________________________________________________________________________ _____________________________________________________________________________________________________________________________________ _____________________________________________________________________________________________________________________________________ _____________________________________________________________________________________________________________________________________  WHEN TO CALL DR WAKEFIELD: Fever over 101.0 Nausea and/or vomiting. Extreme swelling or bruising. Continued bleeding from incision. Increased pain, redness, or drainage from the incision.  The clinic staff is available to answer your questions during regular business hours.  Please don't hesitate to call and ask to speak to one of the nurses for clinical concerns.  If you have a medical emergency, go to the nearest emergency room or call 911.  A surgeon from [facility] is always on call at the hospital.  For further questions, please visit [email or web address]  No tylenol or ibuprofen until 12:30  Post Anesthesia Home Care Instructions  Activity: Get plenty of rest for the remainder of the day. A responsible individual must stay with you for 24 hours following the procedure.  For the next 24 hours, DO NOT: -Drive a car -Paediatric nurse -Drink alcoholic beverages -Take any medication unless instructed by your  physician -Make any legal decisions or sign important papers.  Meals: Start with liquid foods such as gelatin or soup. Progress to regular foods as tolerated. Avoid greasy, spicy, heavy foods. If nausea and/or  vomiting occur, drink only clear liquids until the nausea and/or vomiting subsides. Call your physician if vomiting continues.  Special Instructions/Symptoms: Your throat may feel dry or sore from the anesthesia or the breathing tube placed in your throat during surgery. If this causes discomfort, gargle with warm salt water. The discomfort should disappear within 24 hours.  If you had a scopolamine patch placed behind your ear for the management of post- operative nausea and/or vomiting:  1. The medication in the patch is effective for 72 hours, after which it should be removed.  Wrap patch in a tissue and discard in the trash. Wash hands thoroughly with soap and water. 2. You may remove the patch earlier than 72 hours if you experience unpleasant side effects which may include dry mouth, dizziness or visual disturbances. 3. Avoid touching the patch. Wash your hands with soap and water after contact with the patch.

## 2021-11-02 NOTE — Op Note (Signed)
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 Serita Grammes Anesthesia: General Estimated blood loss: Minimal Complications: None Drains: None Specimens: Left breast tissue containing seed and clip Sponge needle count was correct at completion Decision to recovery stable condition  Indications: 56 year old female who  underwent a mammogram after feeling a palpable mass in December. She was undergoing radiotherapy for endometrial cancer and has now completed that. She was noted to have B density breast. There was a 9 mm high density mass in the left breast upper outer aspect. On ultrasound this measured 1 cm. She had no significant abnormality in the left axilla. 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 We discussed a seed guided excision of this mass.   Procedure: She first had a seed placed by radiology.  I had these mammograms available for my review.  After informed consent was obtained she was taken to the OR.   She was given antibiotics.  SCDs were placed.  She was then placed under general anesthesia without complication.  She was prepped and draped in the standard sterile surgical fashion.  A surgical timeout was then performed.  The seed and lesion were close to the skin. I infiltrated lidocaine and made a curvilinear incision overlying the seed.  I then dissected to the seed. I removed the seed and the surrounding tissue. Mammogram confirmed removal of the seed and the clip.  Hemostasis was observed. I closed this with 2-0 vicryl for the breast tissue. I then closed skin with 3-0 vicryl and 4-0 monocryl. Glue and Steri-Strips were applied.  she tolerated well, was extubated and transferred to recovery stable

## 2021-11-02 NOTE — Transfer of Care (Signed)
Immediate Anesthesia Transfer of Care Note  Patient: Victoria Hunt  Procedure(s) Performed: RADIOACTIVE SEED GUIDED EXCISIONAL LEFT BREAST BIOPSY (Left: Breast)  Patient Location: PACU  Anesthesia Type:General  Level of Consciousness: awake  Airway & Oxygen Therapy: Patient Spontanous Breathing and Patient connected to face mask oxygen  Post-op Assessment: Report given to RN and Post -op Vital signs reviewed and stable  Post vital signs: Reviewed and stable  Last Vitals:  Vitals Value Taken Time  BP    Temp    Pulse 84 11/02/21 0811  Resp 19 11/02/21 0811  SpO2 94 % 11/02/21 0811  Vitals shown include unvalidated device data.  Last Pain:  Vitals:   11/02/21 0624  TempSrc: Oral  PainSc: 0-No pain      Patients Stated Pain Goal: 4 (35/43/01 4840)  Complications: No notable events documented.

## 2021-11-02 NOTE — Interval H&P Note (Signed)
History and Physical Interval Note:  11/02/2021 7:14 AM  Victoria Hunt  has presented today for surgery, with the diagnosis of LEFT BREAST MASS.  The various methods of treatment have been discussed with the patient and family. After consideration of risks, benefits and other options for treatment, the patient has consented to  Procedure(s): RADIOACTIVE SEED GUIDED EXCISIONAL LEFT BREAST BIOPSY (Left) as a surgical intervention.  The patient's history has been reviewed, patient examined, no change in status, stable for surgery.  I have reviewed the patient's chart and labs.  Questions were answered to the patient's satisfaction.     Rolm Bookbinder

## 2021-11-02 NOTE — H&P (Signed)
  56 year old female who has been recently treated with a hysterectomy. Pathology from the procedure showed a grade 1 endometrial cancer. The fallopian tubes and ovaries were otherwise benign. She had a couple of lymph nodes that were both excised as well. She has done well with surgery. She had her case discussed at tumor board and they discussed radiation. She is due to get simulated for radiation this week. She has also recently undergone a mammogram after feeling a palpable mass in December. She has no prior breast history. She has no real family history of any breast or ovarian cancer. She was noted to have B density breast. There was a 9 mm high density mass in the left breast upper outer aspect. On ultrasound this measured 1 cm. She had no significant abnormality in the left axilla. 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 She is referred to me to discuss options.  Review of Systems: A complete review of systems was obtained from the patient. I have reviewed this information and discussed as appropriate with the patient. See HPI as well for other ROS.  Review of Systems  Cardiovascular: Positive for palpitations.  Gastrointestinal: Positive for nausea.  All other systems reviewed and are negative.  Medical History: Past Medical History:  Diagnosis Date   Anxiety   History of cancer   Patient Active Problem List  Diagnosis   Endometrial intraepithelial neoplasia (EIN)   BMI 50.0-59.9, adult (CMS-HCC)   Palpitations   Post-menopausal bleeding   Urinary tract infectious disease   Past Surgical History:  Procedure Laterality Date   APPENDECTOMY   CHOLECYSTECTOMY   HYSTERECTOMY   No Known Allergies  Current Outpatient Medications on File Prior to Visit  Medication Sig Dispense Refill   LORazepam (ATIVAN) 0.5 MG tablet Take by mouth   propranoloL (INDERAL) 10 MG tablet propranolol 10 mg tablet TAKE 1 TABLET BY  MOUTH EVERY DAY   sertraline (ZOLOFT) 25 MG tablet Take 1 tablet by mouth once daily   acetaminophen (TYLENOL) 325 MG tablet Take by mouth   History reviewed. No pertinent family history.   Social History   Tobacco Use  Smoking Status Never  Smokeless Tobacco Never  Socioeconomic History   Marital status: Single  Tobacco Use   Smoking status: Never   Smokeless tobacco: Never  Vaping Use   Vaping Use: Unknown  Substance and Sexual Activity   Alcohol use: Not Currently   Drug use: Never   Objective:   Body mass index is 49.62 kg/m.  Physical Exam Vitals reviewed.  Constitutional:  Appearance: Normal appearance.  Chest:  Breasts: Right: No inverted nipple, mass or nipple discharge.  Left: No inverted nipple, mass or nipple discharge.  Lymphadenopathy:  Upper Body:  Right upper body: No supraclavicular or axillary adenopathy.  Left upper body: No supraclavicular or axillary adenopathy.  Neurological:  Mental Status: She is alert.   Assessment and Plan:   Mass of upper outer quadrant of left breast  Left breast radioactive seed guided excisional biopsy   She would like to wait till after radiation and school ends and I think that is actually reasonable to do this in a couple of months. I discussed with her a radioactive seed guided excisional biopsy with the risks and benefits associated with this. I will plan on scheduling her for when she is completed her pelvic radiation therapy.

## 2021-11-03 ENCOUNTER — Encounter (HOSPITAL_BASED_OUTPATIENT_CLINIC_OR_DEPARTMENT_OTHER): Payer: Self-pay | Admitting: General Surgery

## 2021-11-04 LAB — SURGICAL PATHOLOGY

## 2021-11-10 ENCOUNTER — Encounter: Payer: Self-pay | Admitting: Radiation Oncology

## 2021-11-10 NOTE — Progress Notes (Incomplete)
  Radiation Oncology         [phone] ________________________________  Patient Name: Victoria Hunt MRN: [id number] DOB: May 05, 1966 Referring Physician: Jeral Pinch Date of Service: 10/14/2021 [facility], [place]                                                        End Of Treatment Note  Diagnoses: C54.1-Malignant neoplasm of endometrium  Cancer Staging: The encounter diagnosis was Endometrial cancer ([place]).   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 During her final weekly treatment check for IMRT to the pelvis on 09/28/21, the patient reported mild fatigue, and diarrhea (managed with imodium). She denied any other symptoms. She tolerated brachytherapy well without any complaints.   Plan: The patient will follow-up with radiation oncology in one month .  ________________________________________________ -----------------------------------  Blair Promise, PhD, MD  This document serves as a record of services personally performed by Gery Pray, MD. It was created on his behalf by Roney Mans, a trained medical scribe. The creation of this record is based on the scribe's personal observations and the provider's statements to them. This document has been checked and approved by the attending provider.

## 2021-11-15 ENCOUNTER — Other Ambulatory Visit: Payer: Self-pay

## 2021-11-15 ENCOUNTER — Telehealth: Payer: Self-pay | Admitting: *Deleted

## 2021-11-15 ENCOUNTER — Ambulatory Visit
Admission: RE | Admit: 2021-11-15 | Discharge: 2021-11-15 | Disposition: A | Discharge status: Home / Self Care | Payer: No Typology Code available for payment source | Source: Ambulatory Visit | Attending: Radiation Oncology | Admitting: Radiation Oncology

## 2021-11-15 ENCOUNTER — Telehealth: Payer: Self-pay

## 2021-11-15 DIAGNOSIS — Z923 Personal history of irradiation: Secondary | ICD-10-CM | POA: Diagnosis not present

## 2021-11-15 DIAGNOSIS — C541 Malignant neoplasm of endometrium: Secondary | ICD-10-CM | POA: Insufficient documentation

## 2021-11-15 DIAGNOSIS — Z79899 Other long term (current) drug therapy: Secondary | ICD-10-CM | POA: Diagnosis not present

## 2021-11-15 HISTORY — DX: Personal history of irradiation: Z92.3

## 2021-11-16 ENCOUNTER — Telehealth: Payer: Self-pay | Admitting: Oncology

## 2021-11-19 ENCOUNTER — Encounter (HOSPITAL_COMMUNITY): Payer: Self-pay

## 2021-12-08 ENCOUNTER — Telehealth: Payer: Self-pay | Admitting: *Deleted

## 2021-12-08 NOTE — Telephone Encounter (Signed)
RETURNED PATIENT'S PHONE CALL, LVM FOR A RETURN CALL 

## 2021-12-21 ENCOUNTER — Ambulatory Visit (HOSPITAL_COMMUNITY)
Admission: RE | Admit: 2021-12-21 | Discharge: 2021-12-21 | Disposition: A | Discharge status: Home / Self Care | Payer: No Typology Code available for payment source | Source: Ambulatory Visit | Attending: Physician Assistant | Admitting: Physician Assistant

## 2021-12-21 ENCOUNTER — Ambulatory Visit: Payer: No Typology Code available for payment source | Admitting: Physician Assistant

## 2021-12-21 ENCOUNTER — Encounter: Payer: Self-pay | Admitting: Physician Assistant

## 2021-12-21 VITALS — BP 130/90 | HR 96 | Temp 98.2°F | Ht 63.0 in | Wt 294.0 lb

## 2021-12-21 DIAGNOSIS — D8984 IgG4-related disease: Secondary | ICD-10-CM

## 2021-12-21 DIAGNOSIS — R109 Unspecified abdominal pain: Secondary | ICD-10-CM | POA: Diagnosis not present

## 2021-12-21 DIAGNOSIS — R350 Frequency of micturition: Secondary | ICD-10-CM

## 2021-12-21 DIAGNOSIS — D8989 Other specified disorders involving the immune mechanism, not elsewhere classified: Secondary | ICD-10-CM

## 2021-12-21 DIAGNOSIS — R10A1 Flank pain, right side: Secondary | ICD-10-CM

## 2021-12-21 LAB — POCT URINALYSIS DIPSTICK
Bilirubin, UA: NEGATIVE
Blood, UA: NEGATIVE
Glucose, UA: NEGATIVE
Ketones, UA: NEGATIVE
Nitrite, UA: NEGATIVE
Protein, UA: NEGATIVE
Spec Grav, UA: 1.025 (ref 1.010–1.025)
Urobilinogen, UA: 0.2 E.U./dL
pH, UA: 5 (ref 5.0–8.0)

## 2021-12-21 LAB — SEDIMENTATION RATE: Sed Rate: 52 mm/hr — ABNORMAL HIGH (ref 0–30)

## 2021-12-21 LAB — C-REACTIVE PROTEIN: CRP: 1.5 mg/dL (ref 0.5–20.0)

## 2021-12-21 MED ORDER — CEPHALEXIN 500 MG PO CAPS: 500.0000 mg | ORAL_CAPSULE | Freq: Two times a day (BID) | ORAL | 0 refills | Status: DC

## 2021-12-21 MED ORDER — IOHEXOL 350 MG/ML SOLN
80.0000 mL | Freq: Once | INTRAVENOUS | Status: AC | PRN
Start: 2021-12-21 — End: 2021-12-21
  Administered 2021-12-21: 80 mL via INTRAVENOUS

## 2021-12-21 NOTE — Patient Instructions (Addendum)
It was great to see you!  Start keflex 500 mg twice daily for possible UTI  I will be in touch with your results as soon as they have returned  We are ordering a CT scan  Take care,  [name] PA-C

## 2021-12-21 NOTE — Progress Notes (Signed)
Victoria Hunt is a 56 y.o. female here for a new problem flank pain.   History of Present Illness:   Chief Complaint  Patient presents with   Urinary Frequency   Flank Pain    Pt c/o Right flank pain, x 2.5 weeks. , urinary frequency, fatigue.    HPI  Right Flank Pain  Patient complains of right flank pain that has been onset for 2 weeks. Associated with fatigue and urinary frequency. Had similar issue in the past after having her appendectomy. States she has taken several medications in the past with no relief. She has taken Ibuprofen 800 mg with no relief. She has also taken tramadol with mild improvement in symptoms.   States she woke up this morning and was not feeling well. States she had blood work done yesterday since she had concern about her kidney. This was mostly normal. UA did show trace leuks. Doesn't have significant hx of UTI in the past, just one when she was much younger.  Had CT earlier this year on 06/13/2021 which showed possible subtle inflammatory changes involving the pancreatic head as may be seen with mild or early pancreatitis.   At this time, she would like to be further evaluated for this issue. No fever or chills. Denies n/v. Denies dysuria. No hematuria or kidney stones. Denies back pain.   She was recently dx with IgG-4 related disease.  Past Medical History:  Diagnosis Date   Anxiety 04/22/2021   Arthritis    Breast mass, left    Cataract    possible   Endometrial cancer ([place]) 07/21/2021   Endometrial intraepithelial neoplasia (EIN)    History of radiation therapy    Uterus- 08/30/21-10/14/21- Dr. Gery Hunt   Hypertension 08/25/2020   hx of   Morbidly obese ([place])    Pre-diabetes      Social History   Tobacco Use   Smoking status: Never    Passive exposure: Yes   Smokeless tobacco: Never  Vaping Use   Vaping Use: Never used  Substance Use Topics   Alcohol use: Not Currently   Drug use: Never    Past Surgical History:  Procedure  Laterality Date   APPENDECTOMY  August 25, 2020   CHOLECYSTECTOMY     LAPAROSCOPIC APPENDECTOMY N/A 08/25/2020   Procedure: APPENDECTOMY LAPAROSCOPIC;  Surgeon: Victoria Ok, MD;  Location: [facility];  Service: General;  Laterality: N/A;   RADIOACTIVE SEED GUIDED EXCISIONAL BREAST BIOPSY Left 11/02/2021   Procedure: RADIOACTIVE SEED GUIDED EXCISIONAL LEFT BREAST BIOPSY;  Surgeon: Victoria Bookbinder, MD;  Location: [facility];  Service: General;  Laterality: Left;   ROBOTIC ASSISTED TOTAL HYSTERECTOMY WITH BILATERAL SALPINGO OOPHERECTOMY Bilateral 07/14/2021   Procedure: XI ROBOTIC ASSISTED TOTAL HYSTERECTOMY WITH BILATERAL SALPINGO OOPHORECTOMY, VAGINAL TEAR REPAIR;  Surgeon: Victoria Mosses, MD;  Location[facility];  Service: Gynecology;  Laterality: Bilateral;   SENTINEL NODE BIOPSY N/A 07/14/2021   Procedure: SENTINEL NODE BIOPSY;  Surgeon: Victoria Mosses, MD;  [facility];  Service: Gynecology;  Laterality: N/A;   WISDOM TOOTH EXTRACTION      Family History  Problem Relation Age of Onset   Hyperlipidemia Mother    Depression Father    Heart disease Father    Heart attack Father    Aortic aneurysm Father    Bipolar disorder Father    CVA Maternal Grandmother    CVA Maternal Grandfather    Depression Paternal Grandmother    Myasthenia gravis Paternal Grandfather  Cancer Paternal Great-grandmother        uterine or cervical   Breast cancer Neg Hx    Colon cancer Neg Hx    Pancreatic cancer Neg Hx    Prostate cancer Neg Hx     No Known Allergies  Current Medications:   Current Outpatient Medications:    acetaminophen (TYLENOL) 325 MG tablet, Take 325 mg by mouth as needed., Disp: , Rfl:    cephALEXin (KEFLEX) 500 MG capsule, Take 1 capsule (500 mg total) by mouth 2 (two) times daily., Disp: 14 capsule, Rfl: 0   Review of Systems:   ROS Negative unless otherwise specified per HPI.   Vitals:   Vitals:   12/21/21 1119  BP: (!) 130/90  Pulse: 96   Temp: 98.2 F (36.8 C)  TempSrc: Temporal  SpO2: 99%  Weight: 294 lb (133.4 kg)  Height: '5\' 3"'$  (1.6 m)     Body mass index is 52.08 kg/m.  Physical Exam:   Physical Exam Vitals and nursing note reviewed.  Constitutional:      General: She is not in acute distress.    Appearance: She is well-developed. She is not ill-appearing or toxic-appearing.  Cardiovascular:     Rate and Rhythm: Normal rate and regular rhythm.     Pulses: Normal pulses.     Heart sounds: Normal heart sounds, S1 normal and S2 normal.  Pulmonary:     Effort: Pulmonary effort is normal.     Breath sounds: Normal breath sounds.  Skin:    General: Skin is warm and dry.  Neurological:     Mental Status: She is alert.     GCS: GCS eye subscore is 4. GCS verbal subscore is 5. GCS motor subscore is 6.  Psychiatric:        Speech: Speech normal.        Behavior: Behavior normal. Behavior is cooperative.    Results for orders placed or performed in visit on 12/21/21  POCT urinalysis dipstick  Result Value Ref Range   Color, UA yellow    Clarity, UA clear    Glucose, UA Negative Negative   Bilirubin, UA neg    Ketones, UA neg    Spec Grav, UA 1.025 1.010 - 1.025   Blood, UA neg    pH, UA 5.0 5.0 - 8.0   Protein, UA Negative Negative   Urobilinogen, UA 0.2 0.2 or 1.0 E.U./dL   Nitrite, UA neg    Leukocytes, UA Small (1+) (A) Negative   Appearance     Odor       Assessment and Plan:   Urinary frequency UA today with some evidence of infection Recommend empirically treating with keflex due to worsening symptoms Start 500 mg keflex bid x 7 days Urine culture pending  Right flank pain Due to worsening pain and hx of IgG4 related disease, will pursue imaging for further evaluation to r/o mass/lesion or other cause of pain Follow-up based on results  IgG4 related disease ([place]) She has a rheumatology referral pending but unsure of when this will occur She had a high sensitivity CRP done yesterday  out of pocket that was elevated Will update blood work and likely submit new rheumatology referral based on results  I,Victoria Hunt,acting as a scribe for [employer], PA.,have documented all relevant documentation on the behalf of Victoria Coke, PA,as directed by  Victoria Coke, PA while in the presence of Victoria Hunt, [place].   I, Victoria Hunt, [place], have reviewed all documentation  for this visit. The documentation on 12/21/21 for the exam, diagnosis, procedures, and orders are all accurate and complete.   Victoria Coke, PA-C

## 2021-12-22 ENCOUNTER — Other Ambulatory Visit: Payer: Self-pay | Admitting: Physician Assistant

## 2021-12-22 ENCOUNTER — Other Ambulatory Visit (INDEPENDENT_AMBULATORY_CARE_PROVIDER_SITE_OTHER): Payer: No Typology Code available for payment source

## 2021-12-22 ENCOUNTER — Encounter: Payer: Self-pay | Admitting: Physician Assistant

## 2021-12-22 DIAGNOSIS — R932 Abnormal findings on diagnostic imaging of liver and biliary tract: Secondary | ICD-10-CM

## 2021-12-22 LAB — URINE CULTURE
MICRO NUMBER:: 13720730
SPECIMEN QUALITY:: ADEQUATE

## 2021-12-22 LAB — ANA: Anti Nuclear Antibody (ANA): NEGATIVE

## 2021-12-22 LAB — PROTIME-INR
INR: 1 ratio (ref 0.8–1.0)
Prothrombin Time: 10.8 s (ref 9.6–13.1)

## 2021-12-22 LAB — IBC + FERRITIN
Ferritin: 68.3 ng/mL (ref 10.0–291.0)
Iron: 72 ug/dL (ref 42–145)
Saturation Ratios: 17.9 % — ABNORMAL LOW (ref 20.0–50.0)
TIBC: 403.2 ug/dL (ref 250.0–450.0)
Transferrin: 288 mg/dL (ref 212.0–360.0)

## 2021-12-22 LAB — RHEUMATOID FACTOR: Rheumatoid fact SerPl-aCnc: <14 IU/mL (ref <14)

## 2021-12-23 ENCOUNTER — Encounter: Payer: Self-pay | Admitting: Physician Assistant

## 2021-12-24 ENCOUNTER — Telehealth: Payer: Self-pay | Admitting: *Deleted

## 2021-12-24 NOTE — Telephone Encounter (Signed)
Patient called and stated "I wanted to let Dr Berline Lopes know of the health issues I have been having lately. 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 Both the surgeon and my PCP are trying to get me in with a rheumatoid doctor, but no appts yet. They are having a hard time. Then I have been having right flank and upper abdomen pain with fullness for a year and my PCP did a CT this week. The scan showed scarring of my liver that was not there is January. So I have an appt on 8/9 with liver/liver transplant doctor."   Explained that Dr Berline Lopes is in the [place] today and then clinic this afternoon, but the message would be given to her the office would call her back later today or Monday.   Patient stated "No worries or rush I just thought I should let all my doctor know what is going on.

## 2021-12-27 NOTE — Telephone Encounter (Signed)
Please ask her to keep [place] posted if there is anything that I can help with. And thank her for keeping me up to date.

## 2021-12-28 NOTE — Telephone Encounter (Signed)
Spoke with the patient and explained that DR Berline Lopes got her message and to keep her updated

## 2021-12-29 ENCOUNTER — Encounter: Payer: Self-pay | Admitting: Physician Assistant

## 2021-12-29 ENCOUNTER — Other Ambulatory Visit: Payer: Self-pay | Admitting: Nurse Practitioner

## 2021-12-29 ENCOUNTER — Ambulatory Visit: Payer: No Typology Code available for payment source | Admitting: Internal Medicine

## 2021-12-29 ENCOUNTER — Other Ambulatory Visit: Payer: Self-pay | Admitting: Physician Assistant

## 2021-12-29 DIAGNOSIS — D8989 Other specified disorders involving the immune mechanism, not elsewhere classified: Secondary | ICD-10-CM

## 2021-12-29 DIAGNOSIS — D8984 IgG4-related disease: Secondary | ICD-10-CM | POA: Insufficient documentation

## 2022-01-11 ENCOUNTER — Ambulatory Visit
Admission: RE | Admit: 2022-01-11 | Discharge: 2022-01-11 | Disposition: A | Discharge status: Home / Self Care | Payer: No Typology Code available for payment source | Source: Ambulatory Visit | Attending: Nurse Practitioner | Admitting: Nurse Practitioner

## 2022-01-11 DIAGNOSIS — D8989 Other specified disorders involving the immune mechanism, not elsewhere classified: Secondary | ICD-10-CM

## 2022-01-11 MED ORDER — GADOBENATE DIMEGLUMINE 529 MG/ML IV SOLN
20.0000 mL | Freq: Once | INTRAVENOUS | Status: AC | PRN
  Administered 2022-01-11: 20 mL via INTRAVENOUS

## 2022-01-19 ENCOUNTER — Encounter: Payer: Self-pay | Admitting: Gynecologic Oncology

## 2022-01-20 ENCOUNTER — Other Ambulatory Visit: Payer: Self-pay

## 2022-01-20 ENCOUNTER — Ambulatory Visit: Payer: No Typology Code available for payment source | Admitting: Gynecologic Oncology

## 2022-01-20 ENCOUNTER — Inpatient Hospital Stay: Payer: No Typology Code available for payment source | Attending: Gynecologic Oncology | Admitting: Gynecologic Oncology

## 2022-01-20 ENCOUNTER — Encounter: Payer: Self-pay | Admitting: Gynecologic Oncology

## 2022-01-20 VITALS — BP 142/90 | HR 93 | Temp 98.4°F | Resp 16 | Ht 63.0 in | Wt 285.6 lb

## 2022-01-20 DIAGNOSIS — Z923 Personal history of irradiation: Secondary | ICD-10-CM | POA: Diagnosis not present

## 2022-01-20 DIAGNOSIS — Z8542 Personal history of malignant neoplasm of other parts of uterus: Secondary | ICD-10-CM

## 2022-01-20 DIAGNOSIS — Z90710 Acquired absence of both cervix and uterus: Secondary | ICD-10-CM | POA: Diagnosis not present

## 2022-01-20 DIAGNOSIS — Z90722 Acquired absence of ovaries, bilateral: Secondary | ICD-10-CM | POA: Insufficient documentation

## 2022-01-20 DIAGNOSIS — C541 Malignant neoplasm of endometrium: Secondary | ICD-10-CM

## 2022-01-20 NOTE — Progress Notes (Signed)
Gynecologic Oncology Return Clinic Visit  01/20/22  Reason for Visit: Surveillance visit in the setting of stage II endometrial cancer  Treatment History: Oncology History Overview Note  MMR IHC intact  Cervix margins negative   Endometrial cancer ([place])  06/13/2021 Imaging   CT A/P: 1. Possible subtle inflammatory changes involving the pancreatic head as may be seen with mild or early pancreatitis. Suggest correlation with enzymes. 2. Otherwise no CT evidence for acute intra-abdominal or pelvic abnormality. 3. Heterogenous endometrial stripe thickening up to 24 mm, recommend correlation with pelvic ultrasound which may be performed on a nonemergent basis.   06/17/2021 Initial Biopsy   EMB and removal of a prolapsing polyp through her cervix.  Pathology from this revealed endometrial polyp with complex atypical hyperplasia and extensive squamous metaplasia.  Comment was noted that there are focally back-to-back and cribriform glands concerning for well-differentiated adenocarcinoma although diagnosis of cancer could not be made.   07/14/2021 Surgery   TRH/BSO, SLN biopsy, vaginal laceration repair    Findings:  On EUA, 8-10 cm mobile uterus. On intra-abdominal entry, normal upper abdominal survey. Normal omentum, small and large bowel. Uterus 10cm and somewhat globular, normal in appearance. Bilateral adnexa normal in appearance with some evidence on the right fallopian tube (and right uterosacral ligament) either endosalpingiosis or endometriosis. Mapping successful to right obturator and left hypogastric SLN. Some adhesions between bladder and cervix. No intra-abdominal or pelvic evidence of disease.   07/14/2021 Pathology Results   A. LYMPH NODE, SENTINEL, RIGHT OBTURATOR, EXCISION:  - Lymph node, negative for carcinoma (0/1)   B. LYMPH NODE, SENTINEL, LEFT INTERNAL ILIAC, EXCISION:  - Lymph node, negative for carcinoma (0/1)   C. UTERUS, CERVIX, BILATERAL FALLOPIAN TUBES AND  OVARIES:  - Endometrial carcinoma with foci of squamous differentiation, FIGO  grade 1  - Carcinoma invades for a depth of about 17 mm where myometrial thickness is about 20 mm  - Cervical stroma is involved by the tumor  - Adenomyosis  - Benign unremarkable bilateral fallopian tubes and ovaries  - See oncology table   ONCOLOGY TABLE:  UTERUS, CARCINOMA OR CARCINOSARCOMA: Resection  Procedure: Total hysterectomy and bilateral salpingo-oophorectomy  Histologic Type: Endometrioid carcinoma with foci of squamous  differentiation  Histologic Grade: FIGO grade 1  Myometrial Invasion:       Depth of Myometrial Invasion (mm): 17 mm       Myometrial Thickness (mm): 20 mm       Percentage of Myometrial Invasion: 85%  Uterine Serosa Involvement: Not identified  Cervical stromal Involvement: Present, anterior cervix  Extent of involvement of other tissue/organs: Not identified  Peritoneal/Ascitic Fluid: Not applicable  Lymphovascular Invasion: Not identified  Regional Lymph Nodes:       Pelvic Lymph Nodes Examined:                                   2 Sentinel                                   0 Non-sentinel                                   2 Total       Pelvic Lymph Nodes with Metastasis: 0  Macrometastasis: (>2.0 mm): 0                           Micrometastasis: (>0.2 mm and < 2.0 mm): 0                           Isolated Tumor Cells (<0.2 mm): 0                           Laterality of Lymph Node with Tumor: Not  applicable                           Extracapsular Extension: Not applicable       Para-aortic Lymph Nodes Examined:                                    0 Sentinel                                    0 non-sentinel                                    0 total  Distant Metastasis:       Distant Site(s) Involved: Not applicable  Pathologic Stage Classification (pTNM, AJCC 8th Edition): pT2, pN0  Ancillary Studies: MMR / MSI testing will be ordered   Representative Tumor Block: C22  Comment(s): Pancytokeratin was performed on the lymph nodes and is negative.    07/14/2021 Initial Diagnosis   Endometrial cancer ([place])   08/30/2021 - 10/14/2021 Radiation Therapy   08/30/2021 through 10/14/2021 (Pelvic IMRT 08/30/21 through 10/01/21) (Brachytherapy: 10/07/21 through 10/14/21) Site Technique Total Dose (Gy) Dose per Fx (Gy) Completed Fx Beam Energies  Uterus: Uterus IMRT 45/45 1.8 25/25 6X  Vagina: Pelvis_Bst HDR-brachy          Interval History: Patient has been struggling for several months with a number of symptoms related to other health issues. She recently saw the transplant team for evaluation in he setting of IgG 4 related disease (diagnosed after her recent breast mass excision) and abnormal CT of her liver.  She is waiting to see a rheumatologist in October.  She endorses intermittent joint pain, increased fatigue, flank pain, digestive issues, intermittent diarrhea, and palpitations among other symptoms.  States overall feels well today.  Tolerated radiation without significant side effects.  Diarrhea had resolved within a couple of weeks after.  She denies any vaginal bleeding or discharge.  Denies any pelvic pain.  Has been working on eating a [place].  Is frustrated about her weight because of not being able to exercise due to all of her on going health issues.  She is remembering to use her vaginal dilator although not as frequently as she was instructed.  Past Medical/Surgical History: Past Medical History:  Diagnosis Date   Anxiety 04/22/2021   Arthritis    Breast mass, left    Cataract    possible   Endometrial cancer ([place]) 07/21/2021   Endometrial intraepithelial neoplasia (EIN)    History of radiation therapy    Uterus- 08/30/21-10/14/21- Dr. Gery Pray   Hypertension 08/25/2020   hx  of   Morbidly obese ([place])    Pre-diabetes     Past Surgical History:  Procedure Laterality Date    APPENDECTOMY  August 25, 2020   CHOLECYSTECTOMY     LAPAROSCOPIC APPENDECTOMY N/A 08/25/2020   Procedure: APPENDECTOMY LAPAROSCOPIC;  Surgeon: Ralene Ok, MD;  Location: [facility];  Service: General;  Laterality: N/A;   RADIOACTIVE SEED GUIDED EXCISIONAL BREAST BIOPSY Left 11/02/2021   Procedure: RADIOACTIVE SEED GUIDED EXCISIONAL LEFT BREAST BIOPSY;  Surgeon: Rolm Bookbinder, MD;  Location: [facility];  Service: General;  Laterality: Left;   ROBOTIC ASSISTED TOTAL HYSTERECTOMY WITH BILATERAL SALPINGO OOPHERECTOMY Bilateral 07/14/2021   Procedure: XI ROBOTIC ASSISTED TOTAL HYSTERECTOMY WITH BILATERAL SALPINGO OOPHORECTOMY, VAGINAL TEAR REPAIR;  Surgeon: Lafonda Mosses, MD;  [facility];  Service: Gynecology;  Laterality: Bilateral;   SENTINEL NODE BIOPSY N/A 07/14/2021   Procedure: SENTINEL NODE BIOPSY;  Surgeon: Lafonda Mosses, MD;  [facility];  Service: Gynecology;  Laterality: N/A;   WISDOM TOOTH EXTRACTION      Family History  Problem Relation Age of Onset   Hyperlipidemia Mother    Depression Father    Heart disease Father    Heart attack Father    Aortic aneurysm Father    Bipolar disorder Father    CVA Maternal Grandmother    CVA Maternal Grandfather    Depression Paternal Grandmother    Myasthenia gravis Paternal Grandfather    Cancer Paternal Great-grandmother        uterine or cervical   Breast cancer Neg Hx    Colon cancer Neg Hx    Pancreatic cancer Neg Hx    Prostate cancer Neg Hx     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  Tobacco Use   Smoking status: Never    Passive exposure: Yes   Smokeless tobacco: Never  Vaping Use   Vaping Use: Never used  Substance and  Sexual Activity   Alcohol use: Not Currently   Drug use: Never   Sexual activity: Not Currently    Birth control/protection: Post-menopausal  Other Topics Concern   Not on file  Social History Narrative   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 -- [place], [place]   Social Determinants of Health   Financial Resource Strain: Not on file  Food Insecurity: Not on file  Transportation Needs: Not on file  Physical Activity: Not on file  Stress: Not on file  Social Connections: Not on file    Current Medications:  Current Outpatient Medications:    acetaminophen (TYLENOL) 325 MG tablet, Take 325 mg by mouth as needed., Disp: , Rfl:    ibuprofen (ADVIL) 100 MG tablet, Take 100 mg by mouth every 6 (six) hours as needed for fever., Disp: , Rfl:   Review of Systems: Denies appetite changes, fevers, chills, fatigue, unexplained weight changes. Denies hearing loss, neck lumps or masses, mouth sores, ringing in ears or voice changes. Denies cough or wheezing.  Denies shortness of breath. Denies chest pain or palpitations. Denies leg swelling. Denies abdominal distention, pain, blood in stools, constipation, diarrhea, nausea, vomiting, or early satiety. Denies pain with intercourse, dysuria, frequency, hematuria or incontinence. Denies hot  flashes, pelvic pain, vaginal bleeding or vaginal discharge.   Denies joint pain, back pain or muscle pain/cramps. Denies itching, rash, or wounds. Denies dizziness, headaches, numbness or seizures. Denies swollen lymph nodes or glands, denies easy bruising or bleeding. Denies anxiety, depression, confusion, or decreased concentration.  Physical Exam: BP (!) 142/90 (BP Location: Left Arm, Patient Position: Sitting)   Pulse 93   Temp 98.4 F (36.9 C) (Oral)   Resp 16   Ht $R'5\' 3"'sw$  (1.6 m)   Wt 285 lb 9.6 oz (129.5 kg)   LMP  (LMP Unknown)   SpO2 98%   BMI 50.59 kg/m  General: Alert, oriented, no acute distress. HEENT: Normocephalic, atraumatic, sclera  anicteric. Chest: Clear to auscultation bilaterally.  No wheezes or rhonchi. Cardiovascular: Regular rate and rhythm, no murmurs. Abdomen: Obese, soft, nontender.  Normoactive bowel sounds.  No masses or hepatosplenomegaly appreciated.  Well-healed incisions. Extremities: Grossly normal range of motion.  Warm, well perfused.  No edema bilaterally. Skin: No rashes or lesions noted. Lymphatics: No cervical, supraclavicular, or inguinal adenopathy. GU: Normal appearing external genitalia without erythema, excoriation, or lesions.  Speculum exam reveals mildly atrophic vaginal mucosa, radiation changes noted.  Bimanual exam reveals vaginal mucosa is smooth, no nodularity or masses.  Rectovaginal exam confirms findings.  Laboratory & Radiologic Studies: CT A/P on 12/21/21 for flank pain: IMPRESSION: 1. No acute abnormality identified in the abdomen or pelvis, specifically no evidence of obstructive uropathy. 2. Hepatic morphologic changes suggestive of cirrhosis.  Assessment & Plan: CHARL WELLEN is a 56 y.o. 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 Cervical margins negative. Completed adjuvant IMRT and brachytherapy in 09/2021 MMR intact, MSI stable.   Patient is overall doing well from a cancer standpoint.  She is NED on exam.  Recently had a CT scan for other reasons, no evidence of metastatic disease on this.  My support regarding her other health issues.  She has a visit scheduled with rheumatology in October.  Per NCCN surveillance recommendations, we will continue with visits alternating every 3 months between my office and radiation oncology.  She has a visit in radiation oncology in November.  I have asked her to call after the new year to schedule a visit to see me in February or March.  We discussed signs and symptoms that would be concerning for cancer recurrence, and I stressed the importance of calling if she develops any of  these between visits.  I also stressed the importance of continuing to use her vaginal dilator.  22 minutes of total time was spent for this patient encounter, including preparation, face-to-face counseling with the patient and coordination of care, and documentation of the encounter.  Jeral Pinch, MD  Division of Gynecologic Oncology  Department of Obstetrics and Gynecology  [facility]

## 2022-01-20 NOTE — Patient Instructions (Signed)
It was good to see you today.  I do not see or feel any evidence of cancer recurrence on your exam.  We will continue with visits every 3 months.  Your next visit will be in radiation oncology in November.  Please call my office sometime after the new year to schedule a visit to see me in February or early March.  As always, if you develop new and concerning symptoms between visits, please call to see me sooner.

## 2022-02-14 ENCOUNTER — Encounter: Payer: Self-pay | Admitting: *Deleted

## 2022-02-16 ENCOUNTER — Ambulatory Visit: Payer: No Typology Code available for payment source | Attending: Internal Medicine | Admitting: Internal Medicine

## 2022-02-16 ENCOUNTER — Encounter: Payer: Self-pay | Admitting: Internal Medicine

## 2022-02-16 VITALS — BP 126/86 | HR 97 | Ht 63.0 in | Wt 280.0 lb

## 2022-02-16 DIAGNOSIS — R002 Palpitations: Secondary | ICD-10-CM

## 2022-02-16 NOTE — Patient Instructions (Signed)
Medication Instructions:  Your physician recommends that you continue on your current medications as directed. Please refer to the Current Medication list given to you today.  *If you need a refill on your cardiac medications before your next appointment, please call your pharmacy*   Lab Work: None   Testing/Procedures: None   Follow-Up: At [facility], you and your health needs are our priority.  As part of our continuing mission to provide you with exceptional heart care, we have created designated Provider Care Teams.  These Care Teams include your primary Cardiologist (physician) and Advanced Practice Providers (APPs -  Physician Assistants and Nurse Practitioners) who all work together to provide you with the care you need, when you need it.  We recommend signing up for the patient portal called "MyChart".  Sign up information is provided on this After Visit Summary.  MyChart is used to connect with patients for Virtual Visits (Telemedicine).  Patients are able to view lab/test results, encounter notes, upcoming appointments, etc.  Non-urgent messages can be sent to your provider as well.   To learn more about what you can do with MyChart, go to [email or web address].    Your next appointment:   As needed  The format for your next appointment:   In Person  Provider:   Branch, Mary E, MD     Other Instructions   Important Information About Sugar       

## 2022-02-16 NOTE — Progress Notes (Signed)
Cardiology Office Note:    Date:  02/16/2022   ID:  Victoria Hunt, DOB 09-21-1965, MRN [id number]  PCP:  [name], PA   [facility] Providers Cardiologist:  Janina Mayo, MD     Referring MD: [name], [place]   No chief complaint on file. Palpitations  History of Present Illness:    Victoria Hunt is a 56 y.o. female with a hx of appendicitis, appendectomy, referral from the ED for palpitations  She notes that she had COVID twice. She had sporadic palpitations. Within the last 3-4 months with palpitations. She woke up at 2 AM and felt her heart was racing. She had nausea and heartburn. She walked around the house. Then she went to the ED 05/26/2021.She had a normal w/u in the ED. She has no arrhythmia.  She had sinus rhythm and some PVCs. TSH was normal. Her hgb normal. She was diagnosed with UTI and got Keflex. She feels better. She had some flank pain. She feels constant palpitations. Her EKG is normal. She denies syncope. She was LH with COVID and dizzy spell. She's not taking medications. She is a stress and anxiety eater.Her father has an aortic aneurysm. Mother is healthy. No family hx of arrhythmia. She has hyperlipidemia. She denies smoking. No etoh, no drug use. She drinks 6-8 oz of caffeine. Coffee. Sometimes sweat tea.  Interim Hx: For above symptoms, a ziopatch was ordered and still pending. She noted some high blood pressures. She was told she was diagnosed with endometrial cancer. She has not met with oncology. Palpitations have not been an issue so not taking propanolol. Blood pressures are good at home.  She notes that she had some persistent nausea and underwent CT scan 06/13/2021 showing endometrial thickening up to 24 mm. I don't see any pathology that was done to say she definitively has cancer  Interim Hx 02/16/2022 She is s/p TRH/BSO 2/2 squamous metaplasia found form a endometrial polyp/ stage II grade 1 endometriod adenocarcinoma. She had pelvic RT  08/30/2021.  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 Palpitations have improved.   The 10-year ASCVD risk score (Arnett DK, et al., 2019) is: 2.1%   Values used to calculate the score:     Age: 50 years     Sex: Female     Is Non-Hispanic African American: No     Diabetic: No     Tobacco smoker: No     Systolic Blood Pressure: 347 mmHg     Is BP treated: No     HDL Cholesterol: 39 mg/dL     Total Cholesterol: 176 mg/dL  Cardiology Studies: Ziopatch 06/24/2021- triggered events pred. Sinus rhythm. A few runs of SVT. Infrequent PVCs.  Past Medical History:  Diagnosis Date   Anxiety 04/22/2021   Arthritis    Breast mass, left    Cataract    possible   Endometrial cancer ([place]) 07/21/2021   Endometrial intraepithelial neoplasia (EIN)    History of radiation therapy    Uterus- 08/30/21-10/14/21- Dr. Gery Pray   Hypertension 08/25/2020   hx of   Morbidly obese ([place])    Pre-diabetes     Past Surgical History:  Procedure Laterality Date   APPENDECTOMY  August 25, 2020   CHOLECYSTECTOMY     LAPAROSCOPIC APPENDECTOMY N/A 08/25/2020   Procedure: APPENDECTOMY LAPAROSCOPIC;  Surgeon: Ralene Ok, MD;  Location: [place];  Service: General;  Laterality: N/A;   RADIOACTIVE SEED GUIDED EXCISIONAL BREAST BIOPSY Left 11/02/2021   Procedure:  RADIOACTIVE SEED GUIDED EXCISIONAL LEFT BREAST BIOPSY;  Surgeon: Rolm Bookbinder, MD;  Location: [place];  Service: General;  Laterality: Left;   ROBOTIC ASSISTED TOTAL HYSTERECTOMY WITH BILATERAL SALPINGO OOPHERECTOMY Bilateral 07/14/2021   Procedure: XI ROBOTIC ASSISTED TOTAL HYSTERECTOMY WITH BILATERAL SALPINGO OOPHORECTOMY, VAGINAL TEAR REPAIR;  Surgeon: Lafonda Mosses, MD;  Location: [facility];  Service: Gynecology;  Laterality: Bilateral;   SENTINEL NODE BIOPSY N/A 07/14/2021   Procedure: SENTINEL NODE BIOPSY;  Surgeon: Lafonda Mosses, MD;  Location: [facility];  Service: Gynecology;  Laterality: N/A;   WISDOM TOOTH  EXTRACTION      Current Medications: Current Meds  Medication Sig   acetaminophen (TYLENOL) 325 MG tablet Take 325 mg by mouth as needed.   ibuprofen (ADVIL) 100 MG tablet Take 100 mg by mouth every 6 (six) hours as needed for fever.     Allergies:   Patient has no known allergies.   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  Tobacco Use   Smoking status: Never    Passive exposure: Yes   Smokeless tobacco: Never  Vaping Use   Vaping Use: Never used  Substance and Sexual Activity   Alcohol use: Not Currently   Drug use: Never   Sexual activity: Not Currently    Birth control/protection: Post-menopausal  Other Topics Concern   Not on file  Social History Narrative   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[place], [place]   Social Determinants of Health   Financial Resource Strain: Not on file  Food Insecurity: Not on file  Transportation Needs: Not on file  Physical Activity: Not on file  Stress: Not on file  Social Connections: Not on file   Works with children  Family History: The patient's per above family history includes Aortic aneurysm in her father; Bipolar disorder in her father; CVA in her maternal grandfather and maternal grandmother; Cancer in her paternal great-grandmother; Depression in her father and paternal grandmother; Heart attack in her father; Heart disease in her father; Hyperlipidemia in her mother; Myasthenia gravis in her paternal grandfather. There is no history of Breast cancer, Colon cancer, Pancreatic cancer, or Prostate cancer.  ROS:   Please see the history of present illness.     All other systems reviewed and are negative.  EKGs/Labs/Other Studies Reviewed:    The following studies were  reviewed today:   EKG:  EKG is  ordered today.  The ekg ordered today demonstrates  NSR, no delta wave, normal PR and QTc  Recent Labs: 05/26/2021: TSH 2.217 07/06/2021: ALT 27 07/09/2021: BUN 18; Creatinine, Ser 0.90; Magnesium 2.1; Potassium 3.4; Sodium 136 07/12/2021: Hemoglobin 14.3; Platelets 280.0   Recent Lipid Panel    Component Value Date/Time   CHOL 176 06/18/2021 1034   TRIG 182.0 (H) 06/18/2021 1034   HDL 39.00 (L) 06/18/2021 1034   CHOLHDL 5 06/18/2021 1034   VLDL 36.4 06/18/2021 1034   LDLCALC 101 (H) 06/18/2021 1034     Risk Assessment/Calculations:           Physical Exam:    VS:   Vitals:   02/16/22 0905  BP: 126/86  Pulse: 97  SpO2: 99%  Wt Readings from Last 3 Encounters:  02/16/22 280 lb (127 kg)  01/20/22 285 lb 9.6 oz (129.5 kg)  12/21/21 294 lb (133.4 kg)     GEN: obese, Well nourished, well developed in no acute distress HEENT: Normal, moist mucous membranes LYMPHATICS: No lymphadenopathy CARDIAC: RRR, no murmurs, rubs, gallops RESPIRATORY:  Clear to auscultation without rales, wheezing or rhonchi  ABDOMEN: Soft, non-tender, non-distended MUSCULOSKELETAL:  No edema; No deformity  SKIN: Warm and dry NEUROLOGIC:  Alert and oriented x 3 PSYCHIATRIC:  Normal affect   ASSESSMENT:    #Palpitations : Benign. She notes coffee drinking, discussed cutting back. Notes stress burden is improved. No medication changes.  Blood pressure: her blood pressure is in good control at home. Will continue off additional antihypertensives.   PLAN:    In order of problems listed above:  Follow up as needed       Medication Adjustments/Labs and Tests Ordered: Current medicines are reviewed at length with the patient today.  Concerns regarding medicines are outlined above.  No orders of the defined types were placed in this encounter.  No orders of the defined types were placed in this encounter.   Patient Instructions  Medication  Instructions:  Your physician recommends that you continue on your current medications as directed. Please refer to the Current Medication list given to you today.  *If you need a refill on your cardiac medications before your next appointment, please call your pharmacy*   Lab Work: None   Testing/Procedures: None   Follow-Up: At [facility], you and your health needs are our priority.  As part of our continuing mission to provide you with exceptional heart care, we have created designated Provider Care Teams.  These Care Teams include your primary Cardiologist (physician) and Advanced Practice Providers (APPs -  Physician Assistants and Nurse Practitioners) who all work together to provide you with the care you need, when you need it.  We recommend signing up for the patient portal called "MyChart".  Sign up information is provided on this After Visit Summary.  MyChart is used to connect with patients for Virtual Visits (Telemedicine).  Patients are able to view lab/test results, encounter notes, upcoming appointments, etc.  Non-urgent messages can be sent to your provider as well.   To learn more about what you can do with MyChart, go to [email or web address].    Your next appointment:   As needed  The format for your next appointment:   In Person  Provider:   Janina Mayo, MD     Other Instructions   Important Information About Sugar         Signed, Janina Mayo, MD  02/16/2022 10:01 AM    [place]

## 2022-03-09 NOTE — Progress Notes (Signed)
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 She was treated with radiation therapy and hysterectomy earlier this year for grade I endometrial cancer. Subsequently was also found to have a 1 cm diameter mass in the left breast biopsied demonstrating spindle cell proliferation and had lumpectomy of the nodule. 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 Following this she developed a persistent left flank pain and had abdominal CT with liver morphology changes of cirrhosis. She saw [facility] hepatology clinic in August with updated labs obtained including autoimmune panels and MRI of the liver. Results showed no visible evidence of bile duct dilation and with normal hepatic function labs so observing at this time. As far as musculoskeletal symptoms she has had increased joint pain problems for about 1.5 years.  Notices issues affecting her right hand right elbow and bilateral wrists.  Has also experienced some pain affecting around the flanks or lateral hip areas and in the ankle.  She is experienced skin rashes these breakout on her trunk they are round circumscribed and mildly erythematous.  She is also felt increase in general fatigue and since August has new symptoms with headaches.   Labs reviewed 12/2021 ANA neg ANA 1:160 speckled Actin Ab 76 RF neg ESR 52 CRP 1.6 IgG-4 72 IgG/IgM/IgA wnl Lipase wnl Amylase wnl  11/02/21 BREAST, WITH RADIOACTIVE SEED, LUMPECTOMY:  -    Lymphocytic-plasmacytic lesion/focus, favor  reactive/benign.  -    No breast glandular parenchyma identified.   COMMENT:   The lymphoid focus is relatively confined by a peripheral rim of fibrosis, and distinct sinusoidal architecture is not seen.  Some lymphoid extension into the parenchyma is noted.  The CD20 (pan B-cell marker) and CD3 (pan T-cell marker) have compartmentalization. Likewise, CD138 positive plasma cells have a degree of compartmentalization.  No aberrant CD56 or CD117 reactivity are noted. Kappa and lambda immunohistochemical (IHC) stains show a polyclonal pattern (estimated Kappa:Lambda 3:1).  There are scattered CD68 positive histiocytes.  There are scattered hyalinized foci, which are negative for amyloid (congo red stain is negative).  The B-cell and T-cell distribution and apparent polyclonal nature of the plasma cells makes a low-grade MALT lymphoma unlikely.  No breast glandular parenchyma is identified in this specimen, which raises the possibility of this being an altered lymph node.  The IHC stains and the congo red stain have acceptable controls.    Activities of Daily Living:  Patient reports morning stiffness for 24 hours.   Patient Reports nocturnal pain.  Difficulty dressing/grooming: Denies Difficulty climbing stairs: Reports Difficulty getting out of chair: Reports Difficulty using hands for taps, buttons, cutlery, and/or writing: Denies  Review of Systems  Constitutional:  Positive for fatigue.  HENT:  Negative for mouth sores and mouth dryness.   Eyes:  Positive for dryness.  Respiratory:  Positive for shortness of breath.   Cardiovascular:  Positive for chest pain and palpitations.  Gastrointestinal:  Positive for diarrhea. Negative  for blood in stool and constipation.  Endocrine: Positive for increased urination.  Genitourinary:  Negative for involuntary urination.  Musculoskeletal:  Positive for joint pain, gait problem, joint pain, joint swelling, myalgias, muscle weakness, morning  stiffness, muscle tenderness and myalgias.  Skin:  Positive for rash, hair loss and sensitivity to sunlight. Negative for color change.  Allergic/Immunologic: Negative for susceptible to infections.  Neurological:  Negative for dizziness and headaches.  Hematological:  Negative for swollen glands.  Psychiatric/Behavioral:  Positive for sleep disturbance. Negative for depressed mood. The patient is not nervous/anxious.     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   Special screening for malignant neoplasms, colon 08/20/2021   Atypical chest pain 08/20/2021   Abnormal CT scan 08/20/2021   Nausea without vomiting 08/20/2021   Urinary tract infectious disease 08/17/2021   Endometrial cancer ([place]) 07/21/2021   Endometrial intraepithelial neoplasia (EIN) 07/02/2021   BMI 50.0-59.9, adult ([place]) 07/02/2021   Palpitations 06/18/2021   Post-menopausal bleeding 06/18/2021   Obesity 06/18/2021    Past Medical History:  Diagnosis Date   Anxiety 04/22/2021   Arthritis    Breast mass, left    Cataract    possible   Endometrial cancer ([place]) 07/21/2021   Endometrial intraepithelial neoplasia (EIN)    History of radiation therapy    Uterus- 08/30/21-10/14/21- Dr. Gery Pray   Hypertension 08/25/2020   hx of   Morbidly obese ([place])    Pre-diabetes     Family History  Problem Relation Age of Onset   Depression Father    Heart disease Father    Heart attack Father    Aortic aneurysm Father    Bipolar disorder Father    CVA Maternal Grandmother    CVA Maternal Grandfather    Depression Paternal Grandmother    Myasthenia gravis Paternal Grandfather    Cancer Paternal Great-grandmother        uterine or cervical   Breast cancer Neg Hx    Colon cancer Neg Hx    Pancreatic cancer Neg Hx    Prostate cancer Neg Hx    Past Surgical History:   Procedure Laterality Date   ABDOMINAL HYSTERECTOMY     APPENDECTOMY  August 25, 2020   CHOLECYSTECTOMY     LAPAROSCOPIC APPENDECTOMY N/A 08/25/2020   Procedure: APPENDECTOMY LAPAROSCOPIC;  Surgeon: Ralene Ok, MD;  Location: [place];  Service: General;  Laterality: N/A;   RADIOACTIVE SEED GUIDED EXCISIONAL BREAST BIOPSY Left 11/02/2021   Procedure: RADIOACTIVE SEED GUIDED EXCISIONAL LEFT BREAST BIOPSY;  Surgeon: Rolm Bookbinder, MD;  Location: [place];  Service: General;  Laterality: Left;   ROBOTIC ASSISTED TOTAL HYSTERECTOMY WITH BILATERAL SALPINGO OOPHERECTOMY Bilateral 07/14/2021   Procedure: XI ROBOTIC ASSISTED TOTAL HYSTERECTOMY WITH BILATERAL SALPINGO OOPHORECTOMY, VAGINAL TEAR REPAIR;  Surgeon: Lafonda Mosses, MD;  Location: [facility];  Service: Gynecology;  Laterality: Bilateral;   SENTINEL NODE BIOPSY N/A 07/14/2021   Procedure: SENTINEL NODE BIOPSY;  Surgeon: Lafonda Mosses, MD;  Location[facility];  Service: Gynecology;  Laterality: N/A;   WISDOM TOOTH EXTRACTION     Social History   Social History Narrative   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[place], [place]   Immunization History  Administered Date(s) Administered   Influenza,inj,Quad PF,6+ Mos 02/22/2017   Influenza-Unspecified 02/03/2021, 02/04/2022   Moderna Covid-19 Vaccine Bivalent Booster 44yr & up 01/29/2021  PFIZER(Purple Top)SARS-COV-2 Vaccination 07/20/2019, 08/10/2019, 02/20/2020, 09/15/2020   PPD Test 08/05/2013   Tdap 02/09/2020     Objective: Vital Signs: BP 130/83 (BP Location: Right Arm, Patient Position: Sitting, Cuff Size: Large)   Pulse 80   Resp 16   Ht _0  (1.6 m)   Wt 281 lb (127.5 kg)   LMP  (LMP Unknown)   BMI 49.78 kg/m    Physical Exam Constitutional:      Appearance: She is obese.  HENT:     Right Ear: External ear normal.     Left Ear: External ear normal.     Mouth/Throat:     Mouth: Mucous membranes are moist.     Pharynx: Oropharynx is clear.  Eyes:      Conjunctiva/sclera: Conjunctivae normal.     Comments: Right pupil visibly clouded  Cardiovascular:     Rate and Rhythm: Normal rate and regular rhythm.  Pulmonary:     Effort: Pulmonary effort is normal.     Breath sounds: Normal breath sounds.  Lymphadenopathy:     Cervical: No cervical adenopathy.  Skin:    General: Skin is warm and dry.     Findings: Rash present.     Comments: Facial rosacea  Neurological:     Mental Status: She is alert.  Psychiatric:        Mood and Affect: Mood normal.      Musculoskeletal Exam:  Neck full ROM no tenderness Shoulders full ROM no tenderness or swelling Elbows full ROM no tenderness or swelling Wrists full ROM no tenderness or swelling Fingers full ROM no tenderness or swelling, nontender nodule on medial side of right 3rd PIP Tenderness to pressure on sides and partway around flank over lower ribs, worse on right side, some tenderness on lateral right abdomen Knees full ROM no tenderness or swelling Ankles full ROM, right ankle mild swelling compared to left, tenderness to pressure medially and pain with active and passive eversion, piezogenic papules present MTPs full ROM no tenderness or swelling   Investigation: No additional findings.  Imaging: No results found.  Recent Labs: Lab Results  Component Value Date   WBC 7.2 07/12/2021   HGB 14.3 07/12/2021   PLT 280.0 07/12/2021   NA 136 07/09/2021   K 3.4 (L) 07/09/2021   CL 102 07/09/2021   CO2 26 07/09/2021   GLUCOSE 126 (H) 07/09/2021   BUN 18 07/09/2021   CREATININE 0.90 07/09/2021   BILITOT 0.3 07/06/2021   ALKPHOS 75 07/06/2021   AST 21 07/06/2021   ALT 27 07/06/2021   PROT 7.6 07/06/2021   ALBUMIN 4.3 07/06/2021   CALCIUM 9.2 07/09/2021   GFRAA >90 09/13/2014    Speciality Comments: No specialty comments available.  Procedures:  No procedures performed Allergies: Patient has no known allergies.   Assessment / Plan:     Visit Diagnoses: Positive ANA  (antinuclear antibody) - Plan: Sedimentation rate, RNP Antibody, Anti-Smith antibody, Sjogrens syndrome-A extractable nuclear antibody, Anti-DNA antibody, double-stranded, Anti-scleroderma antibody, Cyclic citrul peptide antibody, IgG  Checking additional antibody panel for positive ANA as detailed above and also rechecking sedimentation rate and CCP antibodies.  Unusual presentation positive ANA not specifically associated with IgG4 related disease in majority of cases.  Did have positive anti-smooth muscle antibodies in GI clinic evaluation that could also explain a positive ANA titer.  If positive but without specific clinical criteria would likely recommend observation at this time.  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  Otherwise no definite organ system involvement no serum IgG4 elevation.  Recent fairly extensive lab work with gastroenterology clinic reviewed and abdominal imaging so not repeating this work-up today.  As of now I cannot confirm a systemic active IgG4 related disease we will monitor and plan to follow-up.  Joint swelling  There is trace swelling at the right ankle although Pisa genic papules are more consistent with subcutaneous edema present versus joint synovitis.  Otherwise I do not see definite evidence of joint inflammation on exam today.  Orders: Orders Placed This Encounter  Procedures   Sedimentation rate   RNP Antibody   Anti-Smith antibody   Sjogrens syndrome-A extractable nuclear antibody   Anti-DNA antibody, double-stranded   Anti-scleroderma antibody   Cyclic citrul peptide antibody, IgG   No orders of the defined types were placed in this encounter.    Follow-Up Instructions: No follow-ups on file.   Collier Salina, MD  Note - This record has been created using [employer].  Chart creation errors have been sought, but may not always  have been located. Such creation errors do not reflect  on  the standard of medical care.

## 2022-03-10 ENCOUNTER — Encounter: Payer: Self-pay | Admitting: Internal Medicine

## 2022-03-10 ENCOUNTER — Ambulatory Visit: Payer: No Typology Code available for payment source | Attending: Internal Medicine | Admitting: Internal Medicine

## 2022-03-10 VITALS — BP 130/83 | HR 80 | Resp 16 | Ht 63.0 in | Wt 281.0 lb

## 2022-03-10 DIAGNOSIS — M2540 Effusion, unspecified joint: Secondary | ICD-10-CM | POA: Diagnosis not present

## 2022-03-10 DIAGNOSIS — R768 Other specified abnormal immunological findings in serum: Secondary | ICD-10-CM | POA: Diagnosis not present

## 2022-03-10 DIAGNOSIS — D8984 IgG4-related disease: Secondary | ICD-10-CM

## 2022-03-12 LAB — ANTI-SMITH ANTIBODY: ENA SM Ab Ser-aCnc: <1 AI

## 2022-03-12 LAB — RNP ANTIBODY: Ribonucleic Protein(ENA) Antibody, IgG: 3.9 AI — AB

## 2022-03-12 LAB — SJOGRENS SYNDROME-A EXTRACTABLE NUCLEAR ANTIBODY: SSA (Ro) (ENA) Antibody, IgG: <1 AI

## 2022-03-12 LAB — SEDIMENTATION RATE: Sed Rate: 31 mm/h — ABNORMAL HIGH (ref 0–30)

## 2022-03-12 LAB — CYCLIC CITRUL PEPTIDE ANTIBODY, IGG: Cyclic Citrullin Peptide Ab: <16 UNITS

## 2022-03-12 LAB — ANTI-SCLERODERMA ANTIBODY: Scleroderma (Scl-70) (ENA) Antibody, IgG: <1 AI

## 2022-03-12 LAB — ANTI-DNA ANTIBODY, DOUBLE-STRANDED: ds DNA Ab: <1 IU/mL

## 2022-03-14 ENCOUNTER — Other Ambulatory Visit: Payer: Self-pay

## 2022-03-14 ENCOUNTER — Other Ambulatory Visit: Payer: Self-pay | Admitting: Physician Assistant

## 2022-03-14 ENCOUNTER — Telehealth: Payer: Self-pay | Admitting: Physician Assistant

## 2022-03-14 ENCOUNTER — Ambulatory Visit: Payer: No Typology Code available for payment source | Admitting: Physician Assistant

## 2022-03-14 ENCOUNTER — Encounter: Payer: Self-pay | Admitting: Physician Assistant

## 2022-03-14 ENCOUNTER — Other Ambulatory Visit (INDEPENDENT_AMBULATORY_CARE_PROVIDER_SITE_OTHER): Payer: No Typology Code available for payment source

## 2022-03-14 VITALS — BP 150/90 | HR 98 | Temp 97.5°F | Ht 63.0 in | Wt 281.0 lb

## 2022-03-14 DIAGNOSIS — M79622 Pain in left upper arm: Secondary | ICD-10-CM

## 2022-03-14 DIAGNOSIS — R350 Frequency of micturition: Secondary | ICD-10-CM | POA: Diagnosis not present

## 2022-03-14 DIAGNOSIS — R319 Hematuria, unspecified: Secondary | ICD-10-CM | POA: Diagnosis not present

## 2022-03-14 DIAGNOSIS — R21 Rash and other nonspecific skin eruption: Secondary | ICD-10-CM | POA: Diagnosis not present

## 2022-03-14 LAB — POCT URINALYSIS DIPSTICK
Bilirubin, UA: NEGATIVE
Blood, UA: POSITIVE
Glucose, UA: NEGATIVE
Ketones, UA: NEGATIVE
Nitrite, UA: NEGATIVE
Protein, UA: NEGATIVE
Spec Grav, UA: >=1.03 — AB (ref 1.010–1.025)
Urobilinogen, UA: 0.2 E.U./dL
pH, UA: 5.5 (ref 5.0–8.0)

## 2022-03-14 LAB — CBC WITH DIFFERENTIAL/PLATELET
Basophils Absolute: 0 10*3/uL (ref 0.0–0.1)
Basophils Relative: 0.3 % (ref 0.0–3.0)
Eosinophils Absolute: 0.2 10*3/uL (ref 0.0–0.7)
Eosinophils Relative: 2.2 % (ref 0.0–5.0)
HCT: 39.2 % (ref 36.0–46.0)
Hemoglobin: 12.8 g/dL (ref 12.0–15.0)
Lymphocytes Relative: 6.1 % — ABNORMAL LOW (ref 12.0–46.0)
Lymphs Abs: 0.5 10*3/uL — ABNORMAL LOW (ref 0.7–4.0)
MCHC: 32.6 g/dL (ref 30.0–36.0)
MCV: 85.7 fl (ref 78.0–100.0)
Monocytes Absolute: 0.6 10*3/uL (ref 0.1–1.0)
Monocytes Relative: 7.1 % (ref 3.0–12.0)
Neutro Abs: 6.5 10*3/uL (ref 1.4–7.7)
Neutrophils Relative %: 84.2 % — ABNORMAL HIGH (ref 43.0–77.0)
Platelets: 242 10*3/uL (ref 150.0–400.0)
RBC: 4.57 Mil/uL (ref 3.87–5.11)
RDW: 15.3 % (ref 11.5–15.5)
WBC: 7.8 10*3/uL (ref 4.0–10.5)

## 2022-03-14 NOTE — Progress Notes (Signed)
Victoria Hunt is a 56 y.o. female here for a new problem.  History of Present Illness:   Chief Complaint  Patient presents with   Rash    Pt started with a rash all over last Wednesday,    axilla     Pt c/o left axilla pain started on Saturday, does not feel any lumps.   Back Pain    Pt c/o mid to lower back pain started yesterday, has nausea all day and vomited once this morning. Denies fever.   Urinary Frequency    Pt c/o frequency x 2 weeks.    HPI  Rash Patient reports of having a rash on Wednesday evening on left side neck and has spread over body. It is initially itchy until it dries up. She reports she manages symptoms with calamine lotion.  She denies any new environmental changes.  She has had several vaccines in the past month including hep B, COVID, flu.  Axilla Pain She is complaining of left axilla pain with no lumps which started two days ago.  Her most recent vaccine was her hepatitis B vaccine which was on October 13.  She had no symptoms up until 2 days ago so she is not certain that this is related to her vaccine. She does have hx of left breast mass.  This was found in February 2023 and she had a subsequent ultrasound at that time.  She was told that this was not cancer.  Back Pain She is complaining of bilateral back pain occurring yesterday. It is in her lower back. She denies history of kidney infection or recent trauma.  Vomiting She reports that she threw up this morning and was nauseous throughout the day. She confirms hot flashes, but denies fever and chills.  She is able to eat and drink without any significant issues at this time.  Urinary Frequency  Complains of having to urinate more frequently. She states that she wakes up in the middle of the night, but denies any burning with urination.She reports that this has been going on for about 2 weeks.  She denies history of kidney infections but has had history of UTI.   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 Gery Pray   Hypertension 08/25/2020   hx of   Morbidly obese ([place])    Pre-diabetes      Social History   Tobacco Use   Smoking status: Never    Passive exposure: Past   Smokeless tobacco: Never  Vaping Use   Vaping Use: Never used  Substance Use Topics   Alcohol use: Not Currently   Drug use: Never    Past Surgical History:  Procedure Laterality Date   ABDOMINAL HYSTERECTOMY     APPENDECTOMY  August 25, 2020   CHOLECYSTECTOMY     LAPAROSCOPIC APPENDECTOMY N/A 08/25/2020   Procedure: APPENDECTOMY LAPAROSCOPIC;  Surgeon: Ralene Ok, MD;  Location: [facility];  Service: General;  Laterality: N/A;   RADIOACTIVE SEED GUIDED EXCISIONAL BREAST BIOPSY Left 11/02/2021   Procedure: RADIOACTIVE SEED GUIDED EXCISIONAL LEFT BREAST BIOPSY;  Surgeon: Rolm Bookbinder, MD;  Location: [facility];  Service: General;  Laterality: Left;   ROBOTIC ASSISTED TOTAL HYSTERECTOMY WITH BILATERAL SALPINGO OOPHERECTOMY Bilateral 07/14/2021  Procedure: XI ROBOTIC ASSISTED TOTAL HYSTERECTOMY WITH BILATERAL SALPINGO OOPHORECTOMY, VAGINAL TEAR REPAIR;  Surgeon: Lafonda Mosses, MD;  Location: [facility];  Service: Gynecology;  Laterality: Bilateral;   SENTINEL NODE BIOPSY N/A 07/14/2021   Procedure: SENTINEL NODE BIOPSY;  Surgeon: Lafonda Mosses, MD;  Location: [facility];  Service: Gynecology;  Laterality: N/A;   WISDOM TOOTH EXTRACTION      Family History  Problem Relation Age of Onset   Depression Father    Heart disease Father    Heart attack Father    Aortic aneurysm Father    Bipolar disorder Father    CVA Maternal Grandmother    CVA Maternal Grandfather    Depression Paternal Grandmother    Myasthenia gravis Paternal Grandfather     Cancer Paternal Great-grandmother        uterine or cervical   Breast cancer Neg Hx    Colon cancer Neg Hx    Pancreatic cancer Neg Hx    Prostate cancer Neg Hx     No Known Allergies  Current Medications:   Current Outpatient Medications:    acetaminophen (TYLENOL) 325 MG tablet, Take 325 mg by mouth as needed., Disp: , Rfl:    ibuprofen (ADVIL) 100 MG tablet, Take 100 mg by mouth every 6 (six) hours as needed for fever., Disp: , Rfl:    Review of Systems:   Review of Systems  Constitutional:  Negative for chills and fever.       (+) hot flashes  Gastrointestinal:  Positive for nausea and vomiting.  Genitourinary:  Positive for frequency.  Musculoskeletal:  Positive for back pain.       (+) left axilla pain  Skin:  Positive for rash.    Vitals:   Vitals:   03/14/22 1522  BP: (!) 150/90  Pulse: 98  Temp: (!) 97.5 F (36.4 C)  TempSrc: Temporal  SpO2: 97%  Weight: 281 lb (127.5 kg)  Height: '5\' 3"'$  (1.6 m)     Body mass index is 49.78 kg/m.  Physical Exam:   Physical Exam Constitutional:      General: She is not in acute distress.    Appearance: Normal appearance. She is not ill-appearing.  HENT:     Head: Normocephalic and atraumatic.     Right Ear: External ear normal.     Left Ear: External ear normal.  Eyes:     Extraocular Movements: Extraocular movements intact.     Pupils: Pupils are equal, round, and reactive to light.  Cardiovascular:     Rate and Rhythm: Normal rate and regular rhythm.     Heart sounds: Normal heart sounds. No murmur heard.    No gallop.  Pulmonary:     Effort: Pulmonary effort is normal. No respiratory distress.     Breath sounds: Normal breath sounds. No wheezing or rales.  Abdominal:     Tenderness: There is no right CVA tenderness or left CVA tenderness.  Lymphadenopathy:     Comments: Significantly tender questionable mass to left axilla    Skin:    General: Skin is warm and dry.     Comments: Skin has scattered  erythematous pustules on bilateral arms torso and legs  Neurological:     Mental Status: She is alert and oriented to person, place, and time.  Psychiatric:        Judgment: Judgment normal.    Results for orders placed or performed in visit on 03/14/22  POCT urinalysis dipstick  Result Value Ref  Range   Color, UA straw    Clarity, UA clear    Glucose, UA Negative Negative   Bilirubin, UA negative    Ketones, UA negative    Spec Grav, UA >=1.030 (A) 1.010 - 1.025   Blood, UA positive    pH, UA 5.5 5.0 - 8.0   Protein, UA Negative Negative   Urobilinogen, UA 0.2 0.2 or 1.0 E.U./dL   Nitrite, UA negative    Leukocytes, UA Small (1+) (A) Negative   Appearance     Odor      Assessment and Plan:   Urinary frequency Symptoms and urinalysis are concerning for possible cystitis. Recommend pushing fluids and trialing Keflex while we await urine culture. We did briefly discuss empirically treating for kidney infection given her vomiting this morning but this was an isolated event and she would like to trial taking Keflex only at this point and escalating her care if her symptoms worsen I have asked her to let us know if any changes in symptoms over the next few days Her vitals are stable in my office today she does not appear unwell I am also obtaining blood work today and if this is concerning for significant white count we will also add on further treatment for possible Pyelo  Hematuria, unspecified type Unclear etiology Recommend recheck urine in 2 weeks for send out urinalysis for evaluation If hematuria persist will refer to urology  Rash Unclear etiology Possible viral infection Discussed possibly trialing steroids but she wants to avoid this at this time Discussed possibly trialing topical steroid ointment but she declines this as well Consider daily antihistamine such as Allegra or Zyrtec to help calm down if there is any sort of histamine reaction Follow-up if any new or  worsening symptoms  Left axillary pain Patient is quite tender in her axilla I cannot rule out that this is related to her vaccine which was 10 days ago however this did not happen with any of her other recent vaccines Given her recent history of breast mass as well as recent endometrial cancer, will obtain stat imaging for further evaluation  I,Verona Buck,acting as a scribe for [name], PA.,have documented all relevant documentation on the behalf of [name], PA,as directed by  [name], PA while in the presence of [name], [place].  I, [name], [place], have reviewed all documentation for this visit. The documentation on 03/14/22 for the exam, diagnosis, procedures, and orders are all accurate and complete.   [name], PA-C

## 2022-03-14 NOTE — Telephone Encounter (Signed)
Patient Name: Victoria Hunt Gender: Female DOB: 17-Nov-1965 Age: 56 Y 92 M 29 D Return Phone Number: [phone] (Primary) Address: City/ State/ Zip: [place]  [address] Client [place] [facility] [place] Client Site [place] [facility] [place] Night Provider [name]- [place] Contact Type Call Who Is Calling Patient / Member / Family / Caregiver Call Type Triage / Clinical Relationship To Patient Self Return Phone Number [phone] (Primary) Chief Complaint Vomiting Reason for Call Symptomatic / Request for [place] is a cancer patient and having symptoms, she developed a rash last week and started having pain under the left arm pain and started vomiting yesterday along with body aches, No fever Translation No Nurse Assessment Nurse: Tito Dine, RN, Neoma Laming Date/Time (Eastern Time): 03/14/2022 12:28:11 PM Confirm and document reason for call. If symptomatic, describe symptoms. ---Caller had (Endometrial cancer) and finished tx on May , currently been evaluated for autoimmune disease, Caller stated she developed a rash last week on Wednesday and started having pain under the left arm pain yesterday and started vomiting yesterday along with body aches, No fever. Does the patient have any new or worsening symptoms? ---Yes Will a triage be completed? ---Yes Related visit to physician within the last 2 weeks? ---No Does the PT have any chronic conditions? (i.e. diabetes, asthma, this includes High risk factors for pregnancy, etc.) ---Yes List chronic conditions. ---Endometrial CA Is the patient pregnant or possibly pregnant? (Ask all females between the ages of 60-55) ---No Is this a behavioral health or substance abuse call? ---No  Guidelines Guideline Title Affirmed Question Affirmed Notes Nurse Date/Time (Eastern Time) Cancer - Skin Symptoms and Questions Large or  small blisters on skin (i.e., fluid filled bubbles or sacs) Tito Dine, RN, Neoma Laming 03/14/2022 12:35:21 PM Disp. Time Eilene Ghazi Time) Disposition Final User 03/14/2022 12:45:06 PM See HCP within 4 Hours (or PCP triage) Yes Tito Dine, RN, Deborah Final Disposition 03/14/2022 12:45:06 PM See HCP within 4 Hours (or PCP triage) Yes Tito Dine, RN, Garrel Ridgel Disagree/Comply Comply Caller Understands Yes PreDisposition Call Doctor Care Advice Given Per Guideline SEE HCP (OR PCP TRIAGE) WITHIN 4 HOURS: * IF OFFICE WILL BE OPEN: You need to be seen within the next 3 or 4 hours. Call your doctor (or NP/PA) now or as soon as the office opens. * IF OFFICE WILL BE CLOSED AND NO PCP (PRIMARY CARE PROVIDER) SECOND-LEVEL TRIAGE: You need to be seen within the next 3 or 4 hours. A nearby Urgent Care Center [facility]) is often a good source of care. Another choice is to go to the ED. Go sooner if you become worse. BRING MEDICINES: * Please bring a list of your current medicines when you go to see the doctor. CALL BACK IF: * You become worse CARE ADVICE given per Cancer - Skin Symptoms and Questions (Adult) guideline. Referrals REFERRED TO PCP OFFICE

## 2022-03-14 NOTE — Patient Instructions (Addendum)
It was great to see you!  For your rash -- please start daily antihistamine of your choice. Consider steroid ointment -- let me know if you would like this.  Start Keflex for your possible UTI. If symptoms worsen, please call me ASAP  We will check ultrasound today and blood work. I will be in touch with all results.  Take care,  [name] PA-C

## 2022-03-14 NOTE — Telephone Encounter (Signed)
See Triage note, pt scheduled today.

## 2022-03-15 ENCOUNTER — Telehealth: Payer: Self-pay | Admitting: Physician Assistant

## 2022-03-15 ENCOUNTER — Other Ambulatory Visit: Payer: Self-pay | Admitting: Physician Assistant

## 2022-03-15 ENCOUNTER — Encounter: Payer: Self-pay | Admitting: Physician Assistant

## 2022-03-15 DIAGNOSIS — D72810 Lymphocytopenia: Secondary | ICD-10-CM

## 2022-03-15 LAB — COMPREHENSIVE METABOLIC PANEL
ALT: 15 U/L (ref 0–35)
AST: 19 U/L (ref 0–37)
Albumin: 4.3 g/dL (ref 3.5–5.2)
Alkaline Phosphatase: 96 U/L (ref 39–117)
BUN: 16 mg/dL (ref 6–23)
CO2: 24 mEq/L (ref 19–32)
Calcium: 9.4 mg/dL (ref 8.4–10.5)
Chloride: 103 mEq/L (ref 96–112)
Creatinine, Ser: 0.8 mg/dL (ref 0.40–1.20)
GFR: 83.49 mL/min (ref >60.00)
Glucose, Bld: 87 mg/dL (ref 70–99)
Potassium: 4 mEq/L (ref 3.5–5.1)
Sodium: 138 mEq/L (ref 135–145)
Total Bilirubin: 0.4 mg/dL (ref 0.2–1.2)
Total Protein: 7.8 g/dL (ref 6.0–8.3)

## 2022-03-15 LAB — URINE CULTURE
MICRO NUMBER:: 14087011
SPECIMEN QUALITY:: ADEQUATE

## 2022-03-15 MED ORDER — CEPHALEXIN 500 MG PO CAPS: 500.0000 mg | ORAL_CAPSULE | Freq: Two times a day (BID) | ORAL | 0 refills | Status: DC

## 2022-03-15 NOTE — Telephone Encounter (Signed)
Verbal order from [facility] for Keflex 500 mg BID x 7 days. Rx sent to pharmacy.

## 2022-03-15 NOTE — Telephone Encounter (Signed)
Spoke to pt told her Rx for Keflex sent to pharmacy. Pt verbalized understanding.

## 2022-03-15 NOTE — Telephone Encounter (Signed)
Pt states rx was not called in for Keflex  Patient Name: Victoria Hunt Gender: Female DOB: [birth year] Age: 56 Y 97 M 29 D Return Phone Number: [phone] (Primary) Address: City/ State/ Zip: [place]  [address] Client [place] [facility] [place] Client Site [place] [facility] [place] Night Provider [name]- [place] Contact Type Call Who Is Calling Patient / Member / Family / Caregiver Call Type Triage / Clinical Relationship To Patient Self Return Phone Number [phone] (Primary) Chief Complaint Prescription Refill or Medication Request (non symptomatic) Reason for Call Medication Question / Request Initial Comment Caller states they were in the office today and needed rx called in. Translation No No Triage Reason Other Nurse Assessment Nurse: Quentin Cornwall, RN, Imari Date/Time (Eastern Time): 03/14/2022 5:49:03 PM Please select the assessment type ---RX called in but not at [place] the name of the medication. ---Keflex Pharmacy name and phone number. ---Sharl Ma [phone] Has the office closed within the last 30 minutes? ---No Does the client directives allow for assistance with medications after hours? ---No Nurse: Quentin Cornwall, RN, Imari Date/Time (Eastern Time): 03/14/2022 5:47:32 PM Confirm and document reason for call. If symptomatic, describe symptoms. ---caller states that she was prescribed Keflex for a UTI but order was not called into the pharmacy. Denies any new or worsening symptoms. Does the patient have any new or worsening symptoms? ---Yes Will a triage be completed? ---No Select reason for no triage. ---Other Disp. Time Eilene Ghazi Time) Disposition Final User 03/14/2022 5:54:32 PM Clinical Call Yes Quentin Cornwall, RN, Imari Final Disposition 03/14/2022 5:54:32 PM Clinical Call Yes Quentin Cornwall, RN, [place]

## 2022-03-18 ENCOUNTER — Other Ambulatory Visit: Payer: No Typology Code available for payment source

## 2022-03-18 LAB — HM MAMMOGRAPHY

## 2022-03-18 NOTE — Progress Notes (Signed)
Her sedimentation rate test is 31 which is decreased compared to earlier this year but still higher than normal. Her RNP antibody test is moderately positive. 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 I do not recommend going ahead with any treatment course right now unless symptoms increase such as new breast nodules or increase in joint swelling. We can schedule a follow up to recheck this abnormal result in about 6 months.

## 2022-03-21 ENCOUNTER — Ambulatory Visit: Payer: No Typology Code available for payment source | Admitting: Physician Assistant

## 2022-03-22 ENCOUNTER — Telehealth: Payer: Self-pay | Admitting: *Deleted

## 2022-03-22 NOTE — Telephone Encounter (Signed)
RETURNED PATIENT'S PHONE CALL, SPOKE WITH PATIENT. ?

## 2022-04-04 ENCOUNTER — Ambulatory Visit: Payer: No Typology Code available for payment source | Admitting: Radiation Oncology

## 2022-04-21 ENCOUNTER — Encounter: Payer: Self-pay | Admitting: Physician Assistant

## 2022-04-21 ENCOUNTER — Ambulatory Visit: Payer: Self-pay | Admitting: Radiation Oncology

## 2022-04-24 NOTE — Progress Notes (Signed)
Radiation Oncology         [phone] ________________________________  Name: Victoria Hunt MRN: [id number]  Date: 04/25/2022  DOB: January 30, 1966  Follow-Up Visit Note  CC: [name], [place]  Lafonda Mosses, MD  No diagnosis found.  Diagnosis: The encounter diagnosis was Endometrial cancer ([place]).   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 She was last seen here for follow-up on 11/15/21. Since her last visit, the patient followed up with Dr. Berline Lopes on 01/20/22. 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 In the setting of her endometrial cancer, the patient otherwise denied any symptoms concerning for disease recurrence and was noted to be NED on examination.       Imaging performed in the interval includes:  -- CT of the abdomen and pelvis with contrast on 12/21/21 (for evaluation of flank pain) showed hepatic morphologic changes suggestive of cirrhosis and no acute abnormality identified in the abdomen or pelvis, (specifically no evidence of obstructive uropathy).   -- MRI of the abdomen on 01/11/22 showed no acute findings.   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      ***                         Allergies:  has No Known  Allergies.  Meds: Current Outpatient Medications  Medication Sig Dispense Refill   acetaminophen (TYLENOL) 325 MG tablet Take 325 mg by mouth as needed.     cephALEXin (KEFLEX) 500 MG capsule Take 1 capsule (500 mg total) by mouth 2 (two) times daily. 14 capsule 0   ibuprofen (ADVIL) 100 MG tablet Take 100 mg by mouth every 6 (six) hours as needed for fever.     No current facility-administered medications for this encounter.    Physical Findings: The patient is in no acute distress. Patient is alert and oriented.  vitals were not taken for this visit. .  No significant changes. Lungs are clear to auscultation bilaterally. Heart has regular rate and rhythm. No palpable cervical, supraclavicular, or axillary adenopathy. Abdomen soft, non-tender, normal bowel sounds.  On pelvic examination the external genitalia were unremarkable. A speculum exam was performed. There are no mucosal lesions noted in the vaginal vault. A Pap smear was obtained of the proximal vagina. On bimanual and rectovaginal examination there were no pelvic masses appreciated. ***    Lab Findings: Lab Results  Component Value Date   WBC 7.8 03/14/2022   HGB 12.8 03/14/2022   HCT 39.2 03/14/2022   MCV 85.7 03/14/2022   PLT 242.0 03/14/2022    Radiographic Findings: No results found.  Impression: The encounter diagnosis was Endometrial cancer ([place]).   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  ***  Plan:  ***   *** minutes of total time was spent for this patient encounter, including preparation, face-to-face counseling with the patient and coordination of care, physical exam, and documentation of the encounter. ____________________________________  Blair Promise, PhD, MD  This document serves as a record of services personally performed by Gery Pray, MD. It was created on his behalf by Roney Mans, a trained medical scribe. The creation of this record is based on the scribe's personal observations and the provider's statements to them. This document has been checked and approved by the attending provider.

## 2022-04-25 ENCOUNTER — Encounter: Payer: Self-pay | Admitting: Radiation Oncology

## 2022-04-25 ENCOUNTER — Ambulatory Visit
Admission: RE | Admit: 2022-04-25 | Discharge: 2022-04-25 | Disposition: A | Discharge status: Home / Self Care | Payer: No Typology Code available for payment source | Source: Ambulatory Visit | Attending: Radiation Oncology | Admitting: Radiation Oncology

## 2022-04-25 VITALS — BP 142/84 | HR 100 | Temp 97.7°F | Resp 20 | Ht 63.0 in | Wt 289.0 lb

## 2022-04-25 DIAGNOSIS — C541 Malignant neoplasm of endometrium: Secondary | ICD-10-CM

## 2022-04-25 DIAGNOSIS — Z923 Personal history of irradiation: Secondary | ICD-10-CM | POA: Insufficient documentation

## 2022-04-25 DIAGNOSIS — Z8542 Personal history of malignant neoplasm of other parts of uterus: Secondary | ICD-10-CM | POA: Insufficient documentation

## 2022-04-25 NOTE — Progress Notes (Signed)
Victoria Hunt is here today for follow up post radiation to the pelvic.  They completed their radiation on: 10/14/21   Does the patient complain of any of the following:  Pain: No Abdominal bloating:  No Diarrhea/Constipation: Yes, diarrhea at times. Nausea/Vomiting: No Vaginal Discharge: No Blood in Urine or Stool: No Urinary Issues (dysuria/incomplete emptying/ incontinence/ increased frequency/urgency):No Does patient report using vaginal dilator 2-3 times a week and/or sexually active 2-3 weeks: Patient using vaginal dilator once per week.  Post radiation skin changes:No   Additional comments if applicable:  BP (!) 597/47 (BP Location: Left Arm, Patient Position: Sitting, Cuff Size: Large)   Pulse 100   Temp 97.7 F (36.5 C)   Resp 20   Ht '5\' 3"'$  (1.6 m)   Wt 289 lb (131.1 kg)   LMP  (LMP Unknown)   SpO2 100%   BMI 51.19 kg/m

## 2022-05-27 ENCOUNTER — Ambulatory Visit: Payer: No Typology Code available for payment source | Admitting: Physician Assistant

## 2022-06-24 ENCOUNTER — Telehealth: Payer: Self-pay

## 2022-06-24 ENCOUNTER — Telehealth: Payer: Self-pay | Admitting: *Deleted

## 2022-06-24 NOTE — Telephone Encounter (Signed)
Enid Derry from (RAD ON[facility]) called office.  Pt is scheduled for a follow up on 08/26/22  at 3:15 with Dr. Berline Lopes.  Enid Derry to notify pt of appointment date and time.

## 2022-06-24 NOTE — Telephone Encounter (Signed)
CALLED PATIENT TO INFORM OF FU APPT. WITH DR. Berline Lopes ON 08-26-22- ARRIVAL TIME- 3 PM, LVM FOR A RETURN CALL

## 2022-06-28 DIAGNOSIS — K7402 Hepatic fibrosis, advanced fibrosis: Secondary | ICD-10-CM | POA: Diagnosis not present

## 2022-06-28 DIAGNOSIS — R768 Other specified abnormal immunological findings in serum: Secondary | ICD-10-CM | POA: Diagnosis not present

## 2022-06-28 DIAGNOSIS — D8984 IgG4-related disease: Secondary | ICD-10-CM | POA: Diagnosis not present

## 2022-06-28 DIAGNOSIS — K7581 Nonalcoholic steatohepatitis (NASH): Secondary | ICD-10-CM | POA: Diagnosis not present

## 2022-08-22 ENCOUNTER — Telehealth: Payer: Self-pay

## 2022-08-22 NOTE — Telephone Encounter (Signed)
Victoria Hunt called and cancelled her appointment for Friday 08-26-22 with Dr. Berline Lopes.  She will call back to reschedule.

## 2022-08-23 IMAGING — CT CT ABD-PELV W/ CM
2 of 5 series · 16 of 46 positions shown, 18 images · IV contrast (Omni 300)
Comparison: CT 08/25/2020

CLINICAL DATA: Sharp stomach pain with nausea

EXAM:
CT ABDOMEN AND PELVIS WITH CONTRAST
TECHNIQUE: Multidetector CT imaging of the abdomen and pelvis was performed
using the standard protocol following bolus administration of
intravenous contrast.

[Series 3: a/p w/ 5mm · axial · 0.98mm/px · z∈[+772,+1242]mm · 13 of 106 slices shown, 15 images]
[im 6/106  soft-tissue]
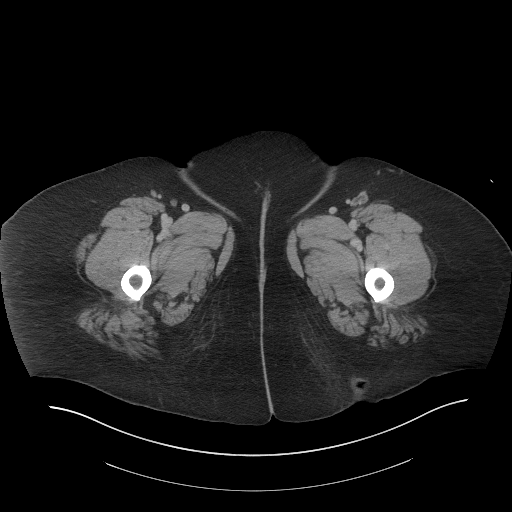
[im 6/106  bone]
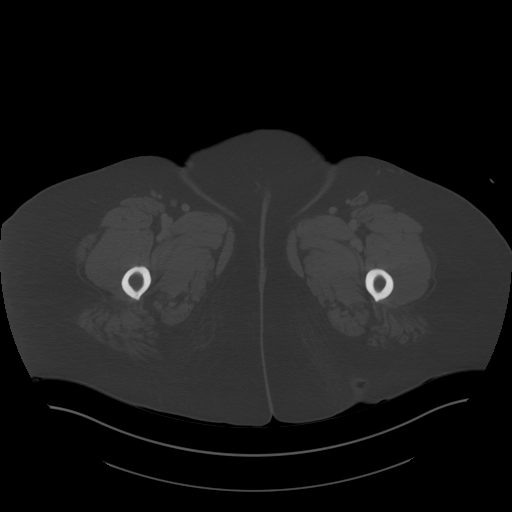
[im 16/106  soft-tissue]
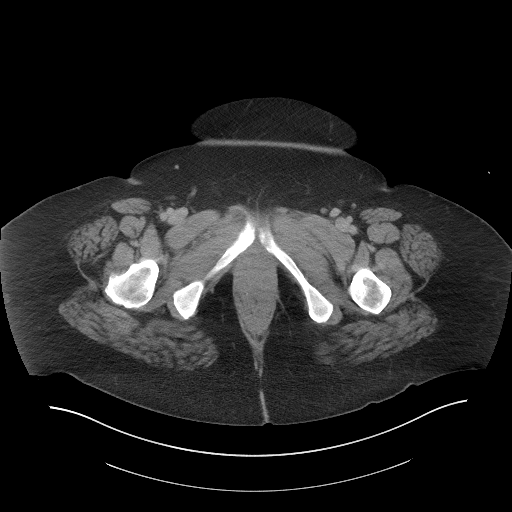
[im 22/106  soft-tissue]
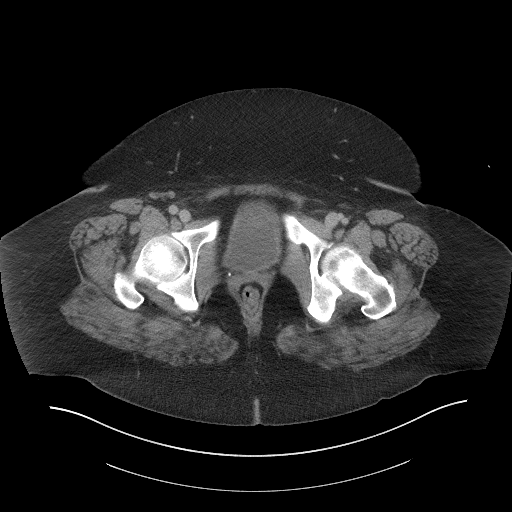
[im 32/106  soft-tissue]
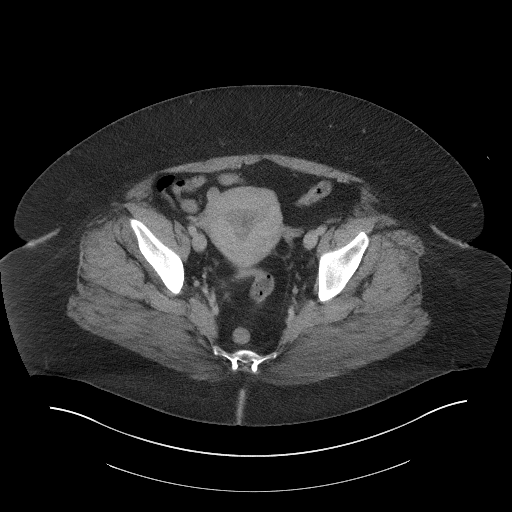
[im 37/106  soft-tissue]
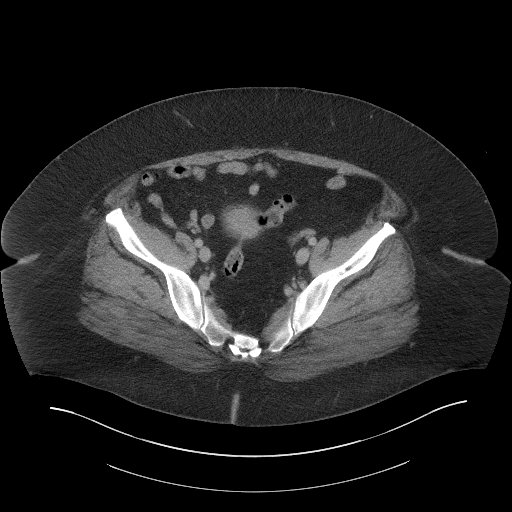
[im 48/106  soft-tissue]
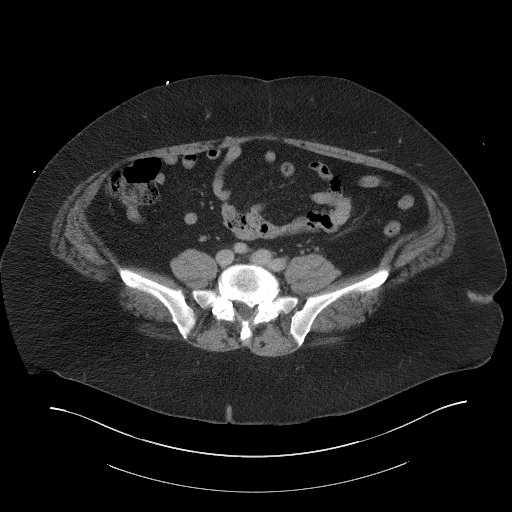
[im 53/106  soft-tissue]
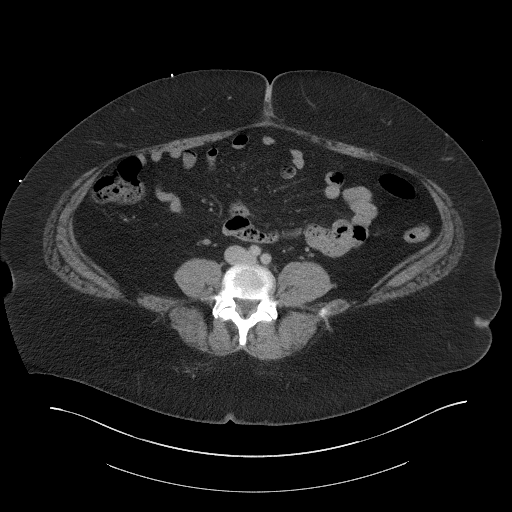
[im 58/106  soft-tissue]
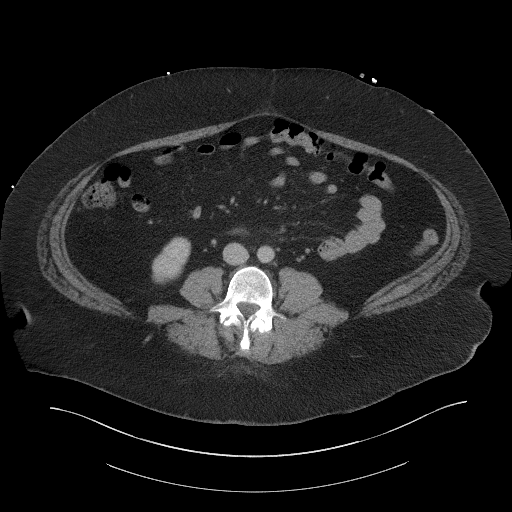
[im 69/106  soft-tissue]
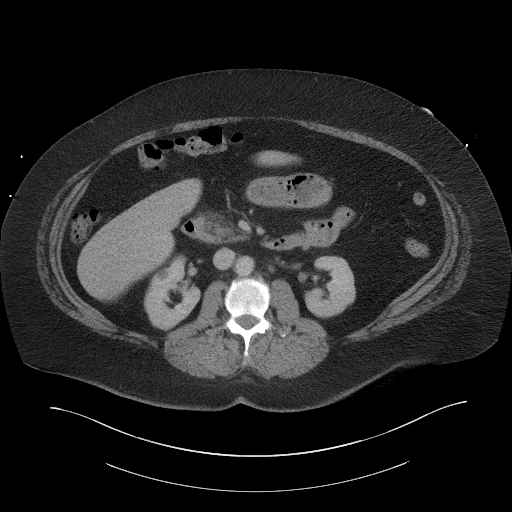
[im 69/106  bone]
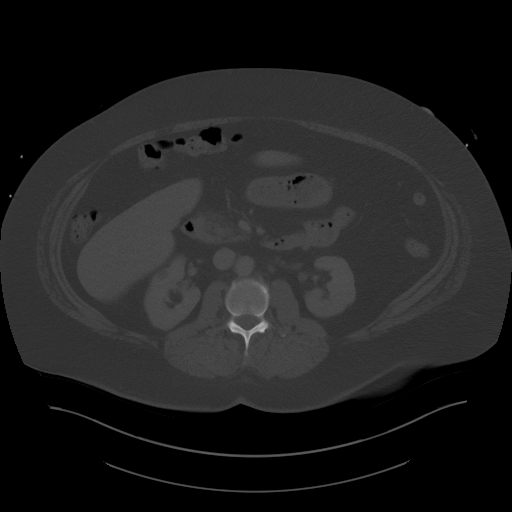
[im 74/106  soft-tissue]
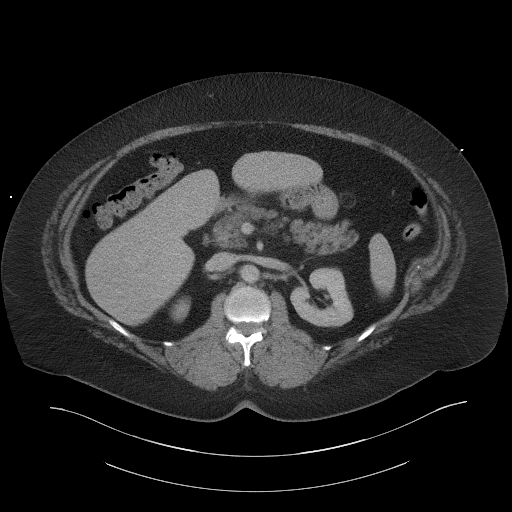
[im 85/106  soft-tissue]
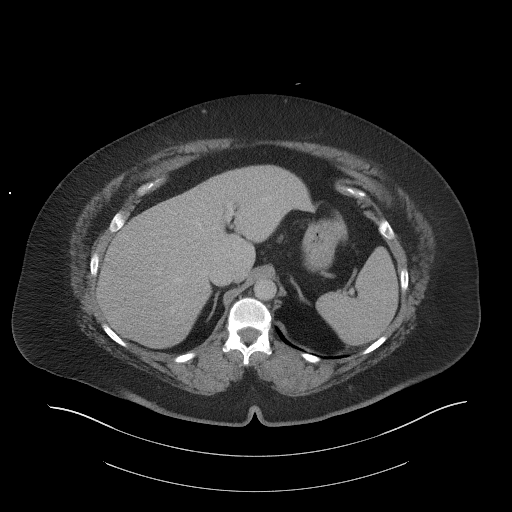
[im 90/106  soft-tissue]
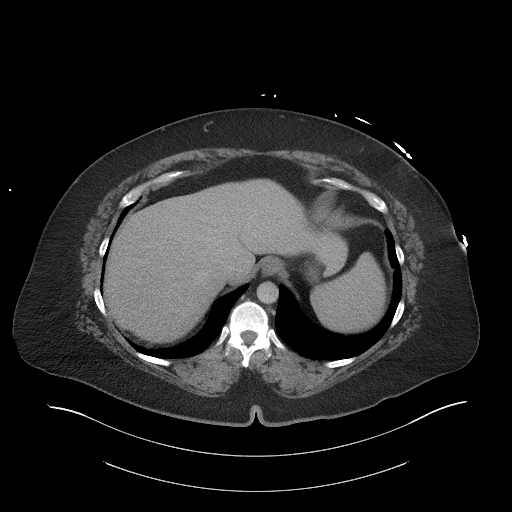
[im 100/106  soft-tissue]
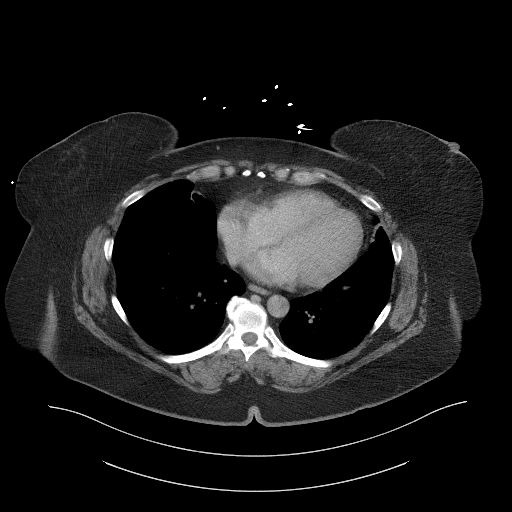

[Series 6: a/p w/ cor · coronal · 1.03mm/px · 3 of 194 slices shown]
[im 65/194  soft-tissue]
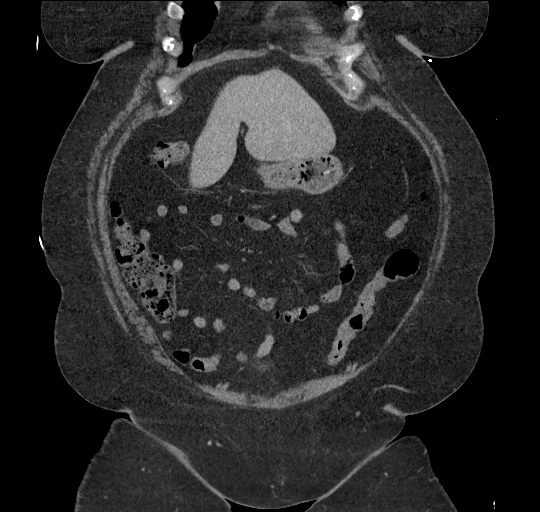
[im 86/194  soft-tissue]
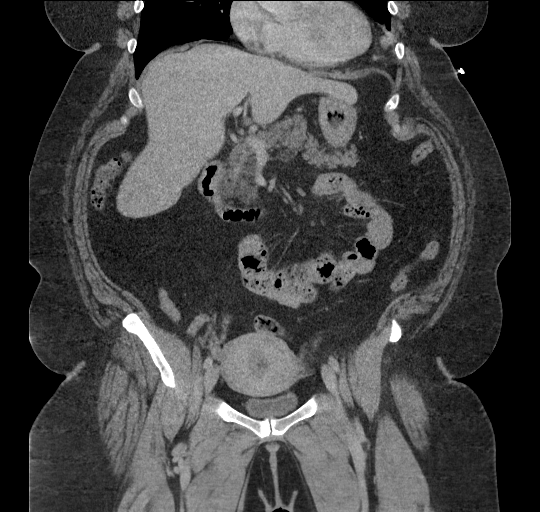
[im 108/194  soft-tissue]
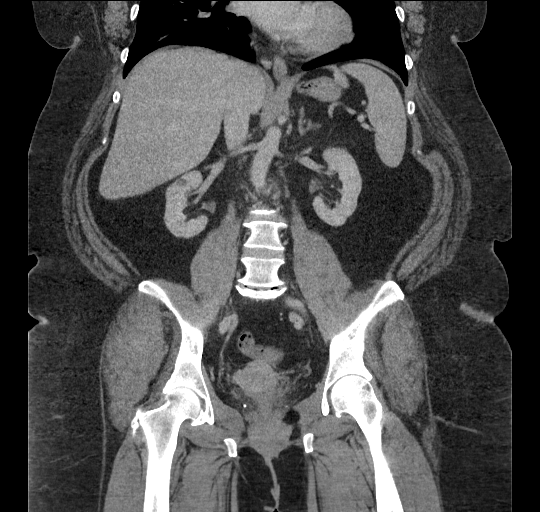

[16 of 46 positions shown; findings below may reference images not displayed]

RADIATION DOSE REDUCTION: This exam was performed according to the
departmental dose-optimization program which includes automated
exposure control, adjustment of the mA and/or kV according to
patient size and/or use of iterative reconstruction technique.

CONTRAST:  100mL OMNIPAQUE IOHEXOL 300 MG/ML  SOLN
FINDINGS: Lower chest: Lung bases demonstrate no acute consolidation or
effusion. Normal cardiac size.

Hepatobiliary: No focal liver abnormality is seen. Status post
cholecystectomy. No biliary dilatation.

Pancreas: Possible subtle fat stranding at the pancreatic head. No
ductal dilatation

Spleen: Normal in size without focal abnormality.

Adrenals/Urinary Tract: Adrenal glands are normal. Subcentimeter
hypodensity mid right kidney. No hydronephrosis. The bladder is
unremarkable.

Stomach/Bowel: Stomach is within normal limits. Interval
appendectomy. No evidence of bowel wall thickening, distention, or
inflammatory changes.

Vascular/Lymphatic: No significant vascular findings are present. No
enlarged abdominal or pelvic lymph nodes.

Reproductive: Prominent endometrial stripe up to 24 mm. Possible
left fundal fibroid. No adnexal mass

Other: Negative for pelvic effusion or free air. Small fat
containing umbilical hernia

Musculoskeletal: Degenerative changes at L5-S1
IMPRESSION: 1. Possible subtle inflammatory changes involving the pancreatic
head as may be seen with mild or early pancreatitis. Suggest
correlation with enzymes.
2. Otherwise no CT evidence for acute intra-abdominal or pelvic
abnormality.
3. Heterogenous endometrial stripe thickening up to 24 mm, recommend
correlation with pelvic ultrasound which may be performed on a
nonemergent basis.

## 2022-08-26 ENCOUNTER — Inpatient Hospital Stay: Payer: No Typology Code available for payment source | Admitting: Gynecologic Oncology

## 2022-09-18 IMAGING — DX DG CHEST 1V PORT
1 series · 1 of 1 positions shown · non-contrast
Comparison: 05/26/2021

CLINICAL DATA: Shortness of breath.

EXAM:
PORTABLE CHEST 1 VIEW

[chest ap]
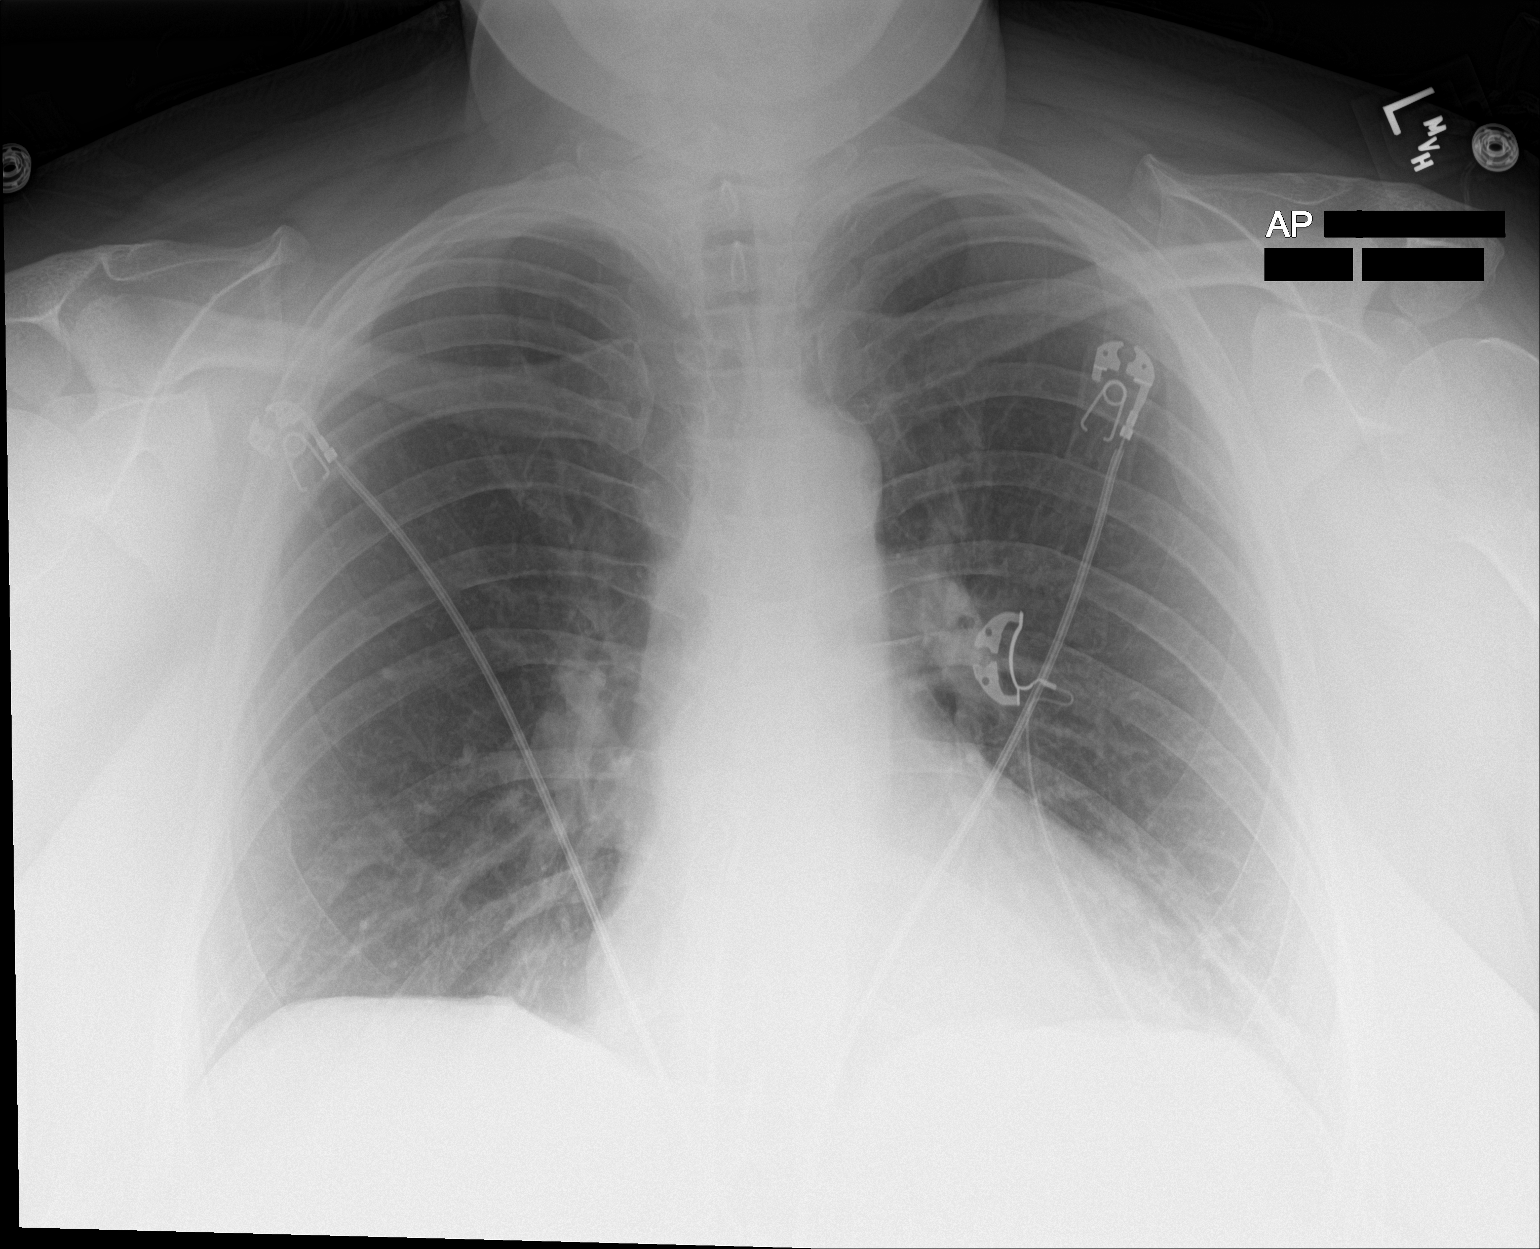

[1 of 1 positions shown; findings below may reference images not displayed]

FINDINGS: Cardiac silhouette and mediastinal contours are within normal
limits. The lungs are clear. No pulmonary edema, pleural effusion,
or pneumothorax. No significant skeletal abnormality.
IMPRESSION: No active disease.

## 2022-10-14 ENCOUNTER — Telehealth: Payer: Self-pay | Admitting: *Deleted

## 2022-10-14 NOTE — Telephone Encounter (Signed)
CALLED PATIENT TO ALTER FU ON 10-31-22 DUE TO DR. KINARD BEING ON VACATION, RESCHEDULED FOR 11-10-22 @ 4PM, LVM FOR A RETURN CALL

## 2022-10-28 ENCOUNTER — Telehealth: Payer: Self-pay | Admitting: Radiation Oncology

## 2022-10-28 NOTE — Telephone Encounter (Signed)
VM received from pt who stated she needed to cx her 6/20 F/U30. I called pt back to let her know appt was cx, LVM advising someone would reach back out next week to r/s with her.

## 2022-10-31 ENCOUNTER — Ambulatory Visit: Payer: Self-pay | Admitting: Radiation Oncology

## 2022-11-10 ENCOUNTER — Ambulatory Visit: Payer: Self-pay | Admitting: Radiation Oncology

## 2022-11-11 ENCOUNTER — Telehealth: Payer: Self-pay | Admitting: Radiation Oncology

## 2022-11-11 NOTE — Telephone Encounter (Signed)
6/21 @ 8:25 am Left voicemail for patient to call our office to be reschedule for FU30 missed appt on yesterday.

## 2022-11-18 ENCOUNTER — Other Ambulatory Visit: Payer: Self-pay

## 2022-11-18 ENCOUNTER — Encounter (HOSPITAL_COMMUNITY): Payer: Self-pay | Admitting: *Deleted

## 2022-11-18 ENCOUNTER — Emergency Department (HOSPITAL_COMMUNITY)
Admission: EM | Admit: 2022-11-18 | Discharge: 2022-11-18 | Disposition: A | Discharge status: Home / Self Care | Payer: BC Managed Care – PPO | Attending: Emergency Medicine | Admitting: Emergency Medicine

## 2022-11-18 ENCOUNTER — Emergency Department (HOSPITAL_COMMUNITY): Payer: BC Managed Care – PPO

## 2022-11-18 DIAGNOSIS — I10 Essential (primary) hypertension: Secondary | ICD-10-CM | POA: Insufficient documentation

## 2022-11-18 DIAGNOSIS — R0609 Other forms of dyspnea: Secondary | ICD-10-CM | POA: Insufficient documentation

## 2022-11-18 DIAGNOSIS — Z8542 Personal history of malignant neoplasm of other parts of uterus: Secondary | ICD-10-CM | POA: Insufficient documentation

## 2022-11-18 DIAGNOSIS — R072 Precordial pain: Secondary | ICD-10-CM | POA: Insufficient documentation

## 2022-11-18 DIAGNOSIS — R030 Elevated blood-pressure reading, without diagnosis of hypertension: Secondary | ICD-10-CM

## 2022-11-18 DIAGNOSIS — E1165 Type 2 diabetes mellitus with hyperglycemia: Secondary | ICD-10-CM | POA: Insufficient documentation

## 2022-11-18 DIAGNOSIS — R0789 Other chest pain: Secondary | ICD-10-CM | POA: Diagnosis not present

## 2022-11-18 DIAGNOSIS — R7989 Other specified abnormal findings of blood chemistry: Secondary | ICD-10-CM | POA: Diagnosis not present

## 2022-11-18 DIAGNOSIS — I517 Cardiomegaly: Secondary | ICD-10-CM | POA: Diagnosis not present

## 2022-11-18 LAB — BASIC METABOLIC PANEL
Anion gap: 12 (ref 5–15)
BUN: 12 mg/dL (ref 6–20)
CO2: 23 mmol/L (ref 22–32)
Calcium: 9.1 mg/dL (ref 8.9–10.3)
Chloride: 101 mmol/L (ref 98–111)
Creatinine, Ser: 0.92 mg/dL (ref 0.44–1.00)
GFR, Estimated: >60 mL/min (ref >60)
Glucose, Bld: 104 mg/dL — ABNORMAL HIGH (ref 70–99)
Potassium: 4 mmol/L (ref 3.5–5.1)
Sodium: 136 mmol/L (ref 135–145)

## 2022-11-18 LAB — TROPONIN I (HIGH SENSITIVITY)
Troponin I (High Sensitivity): 4 ng/L (ref <18)
Troponin I (High Sensitivity): 4 ng/L (ref <18)

## 2022-11-18 LAB — CBC
HCT: 40.9 % (ref 36.0–46.0)
Hemoglobin: 13.5 g/dL (ref 12.0–15.0)
MCH: 28.1 pg (ref 26.0–34.0)
MCHC: 33 g/dL (ref 30.0–36.0)
MCV: 85.2 fL (ref 80.0–100.0)
Platelets: 311 10*3/uL (ref 150–400)
RBC: 4.8 MIL/uL (ref 3.87–5.11)
RDW: 13.8 % (ref 11.5–15.5)
WBC: 7 10*3/uL (ref 4.0–10.5)
nRBC: 0 % (ref 0.0–0.2)

## 2022-11-18 LAB — D-DIMER, QUANTITATIVE: D-Dimer, Quant: <0.27 ug/mL-FEU (ref 0.00–0.50)

## 2022-11-18 NOTE — ED Triage Notes (Signed)
C/o blood pressure going up and down for 3-4 days  c/o chest pressure with fatigue and some sob. States this is happening more and more .

## 2022-11-18 NOTE — Discharge Instructions (Addendum)
It was a pleasure taking care of you today.  As discussed, all of your labs are reassuring.  Your blood pressure was elevated while in the ED.  Please keep a diary of your blood pressures at home.  Follow-up with PCP within 1 week for BP recheck.  Please call your cardiologist tomorrow to schedule an appointment for further evaluation.  Return to the ER for new or worsening symptoms.

## 2022-11-18 NOTE — ED Provider Notes (Signed)
Care assumed from [facility], PA-C at shift change pending D-dimer and second troponin.  See his note for full HPI.  In short, patient is a 57 year old female with a history of endometrial cancer who presents to the ED due to chest pressure for the past 3 to 4 days described as a "knot in her chest".  Also endorses increasing fatigue.  No history of blood clots.  Plan from previous provider, follow-up with D-dimer and second troponin.  If normal patient will be discharged home. Physical Exam  BP (!) 168/101   Pulse 81   Temp 98.9 F (37.2 C) (Oral)   Resp 16   Ht 5\' 3"  (1.6 m)   Wt (!) 141.1 kg   LMP  (LMP Unknown)   SpO2 96%   BMI 55.09 kg/m   Physical Exam Vitals and nursing note reviewed.  Constitutional:      General: She is not in acute distress.    Appearance: She is not ill-appearing.  HENT:     Head: Normocephalic.  Eyes:     Pupils: Pupils are equal, round, and reactive to light.  Cardiovascular:     Rate and Rhythm: Normal rate and regular rhythm.     Pulses: Normal pulses.     Heart sounds: Normal heart sounds. No murmur heard.    No friction rub. No gallop.  Pulmonary:     Effort: Pulmonary effort is normal.     Breath sounds: Normal breath sounds.  Abdominal:     General: Abdomen is flat. There is no distension.     Palpations: Abdomen is soft.     Tenderness: There is no abdominal tenderness. There is no guarding or rebound.  Musculoskeletal:        General: Normal range of motion.     Cervical back: Neck supple.  Skin:    General: Skin is warm and dry.  Neurological:     General: No focal deficit present.     Mental Status: She is alert.  Psychiatric:        Mood and Affect: Mood normal.        Behavior: Behavior normal.     Procedures  Procedures  ED Course / MDM   Clinical Course as of 11/18/22 1604  Fri Nov 18, 2022  1604 D-Dimer, Quant: <0.27 [CA]    Clinical Course User Index [CA] Mannie Stabile, PA-C   Medical Decision  Making Amount and/or Complexity of Data Reviewed Labs: ordered. Decision-making details documented in ED Course. Radiology: ordered.  Care assumed from [facility], PA-C at shift change pending D-dimer and second troponin.  See his note for full MDM.  57 year old female presents to the ED due to chest pressure x 3 to 4 days.  History of endometrial cancer status post hysterectomy and radiation.  No history of blood clots.   CBC unremarkable.  No leukocytosis.  Normal hemoglobin.  Initial troponin normal at 4.  3:40 PM reassessed patient at bedside.  Patient denies any current chest pain.  BP improved to 125/82.  Heart rate normal on cardiac monitor.  Awaiting second troponin and D-dimer.  History of endometrial cancer status post hysterectomy and radiation.  2nd troponin normal.  D-dimer normal.  Low suspicion for PE. EKG demonstrates NSR, no signs of acute ischemia. Low suspicion for ACS.  Upon reassessment, patient has remained CP free. BP and HR improved.  Patient stable for discharge.  Advised patient to follow-up with her cardiologist for further evaluation.  Advised patient  to keep BP diary and follow-up with PCP within the next week for BP recheck.  Patient is asymptomatic.  Low suspicion for hypertensive emergency/urgency. Strict ED precautions discussed with patient. Patient states understanding and agrees to plan. Patient discharged home in no acute distress and stable vitals  Has PCP Lives at home    Jesusita Oka 11/18/22 1646    Vanetta Mulders, MD 11/19/22 1409

## 2022-11-18 NOTE — ED Provider Notes (Signed)
EMERGENCY DEPARTMENT AT [facility] Provider Note   CSN: [id number] Arrival date & time: 11/18/22  1237     History  Chief Complaint  Patient presents with   Chest Pain    Victoria Hunt is a 57 y.o. female.  Patient with history of endometrial cancer status post hysterectomy and radiation therapy, no active therapy, history of lumpectomy which was benign --presents to the emergency department for evaluation of 3 to 4 days of chest pressure described as a "knot in the chest".  She has noted increasing fatigue that she has intermittently but more severe since her symptoms began.  She also reports decreased exercise tolerance and getting more short of breath with walking.  She does report increasing tightness with exertion as well.  No fevers or URI symptoms.  No cough.  Patient reports checking her blood pressure and pulse at home and noticed that they have been swinging up and down.  No urinary symptoms. Patient denies risk factors for pulmonary embolism including: unilateral leg swelling, history of DVT/PE/other blood clots, use of exogenous hormones, recent immobilizations, recent surgery, recent travel (>4hr segment), malignancy, hemoptysis.  No recent medication changes.  She denies history of hypertension, high cholesterol, diabetes that have required treatment.  Denies tobacco use and denies strong family history of heart disease.       Home Medications Prior to Admission medications   Medication Sig Start Date End Date Taking? Authorizing Provider  acetaminophen (TYLENOL) 325 MG tablet Take 325 mg by mouth as needed.    [provider]  ibuprofen (ADVIL) 100 MG tablet Take 100 mg by mouth every 6 (six) hours as needed for fever.    [provider]      Allergies    Patient has no known allergies.    Review of Systems   Review of Systems  Physical Exam Updated Vital Signs BP (!) 168/101   Pulse 81   Temp 98.9 F (37.2 C) (Oral)    Resp 16   Ht 5\' 3"  (1.6 m)   Wt (!) 141.1 kg   LMP  (LMP Unknown)   SpO2 96%   BMI 55.09 kg/m  Physical Exam Vitals and nursing note reviewed.  Constitutional:      General: She is not in acute distress.    Appearance: She is well-developed. She is not diaphoretic.  HENT:     Head: Normocephalic and atraumatic.     Right Ear: External ear normal.     Left Ear: External ear normal.     Nose: Nose normal.     Mouth/Throat:     Mouth: Mucous membranes are not dry.  Eyes:     Conjunctiva/sclera: Conjunctivae normal.  Neck:     Vascular: Normal carotid pulses. No JVD.     Trachea: Trachea normal. No tracheal deviation.  Cardiovascular:     Rate and Rhythm: Normal rate and regular rhythm.     Pulses: No decreased pulses.          Radial pulses are 2+ on the right side and 2+ on the left side.     Heart sounds: Normal heart sounds, S1 normal and S2 normal. No murmur heard. Pulmonary:     Effort: Pulmonary effort is normal. No respiratory distress.     Breath sounds: No wheezing, rhonchi or rales.  Chest:     Chest wall: No tenderness.  Abdominal:     General: Bowel sounds are normal.     Palpations:  Abdomen is soft.     Tenderness: There is no abdominal tenderness. There is no guarding or rebound.  Musculoskeletal:        General: Normal range of motion.     Cervical back: Normal range of motion and neck supple. No muscular tenderness.     Right lower leg: No edema.     Left lower leg: No edema.  Skin:    General: Skin is warm and dry.     Coloration: Skin is not pale.     Findings: No rash.  Neurological:     General: No focal deficit present.     Mental Status: She is alert. Mental status is at baseline.     Motor: No weakness.  Psychiatric:        Mood and Affect: Mood is anxious.     ED Results / Procedures / Treatments   Labs (all labs ordered are listed, but only abnormal results are displayed) Labs Reviewed  BASIC METABOLIC PANEL - Abnormal; Notable for the  following components:      Result Value   Glucose, Bld 104 (*)    All other components within normal limits  CBC  D-DIMER, QUANTITATIVE  TROPONIN I (HIGH SENSITIVITY)  TROPONIN I (HIGH SENSITIVITY)    ED ECG REPORT   Date: 11/18/2022  Rate: 98  Rhythm: normal sinus rhythm  QRS Axis: normal  Intervals: normal  ST/T Wave abnormalities: normal  Conduction Disutrbances:none  Narrative Interpretation: poor baseline  Old EKG Reviewed: unchanged  I have personally reviewed the EKG tracing and disagree with the computerized printout as noted.   Radiology DG Chest 2 View  Result Date: 11/18/2022 CLINICAL DATA:  Chest pain/tightness 3-4 days with labile blood pressure. EXAM: CHEST - 2 VIEW COMPARISON:  07/09/2021 FINDINGS: Lungs are adequately inflated and otherwise clear. Borderline stable cardiomegaly. Remainder of the exam is unchanged. IMPRESSION: No acute cardiopulmonary disease. Electronically Signed   By: Elberta Fortis M.D.   On: 11/18/2022 13:15    Procedures Procedures    Medications Ordered in ED Medications - No data to display  ED Course/ Medical Decision Making/ A&P    Patient seen and examined. History obtained directly from patient. Work-up including labs, imaging, EKG ordered in triage, if performed, were reviewed.    Labs/EKG: Independently reviewed and interpreted.  This included: CBC unremarkable; BMP minimally elevated blood sugar otherwise unremarkable; first troponin normal at 4.  After discussion with patient I have added D-dimer to evaluate for the possibility of blood clot given her dyspnea with exertion.  Will also check second troponin.  Imaging: Independently visualized and interpreted.  This included: Chest x-ray, agree no signs of pneumonia, pulmonary edema, cardiomegaly or other problems.  Medications/Fluids: None ordered  Most recent vital signs reviewed and are as follows: BP (!) 168/101   Pulse 81   Temp 98.9 F (37.2 C) (Oral)   Resp 16    Ht 5\' 3"  (1.6 m)   Wt (!) 141.1 kg   LMP  (LMP Unknown)   SpO2 96%   BMI 55.09 kg/m   Initial impression: Atypical chest pressure, fatigue.  3:46 PM patient discussed with a [facility] PA-C at shift change.  Currently awaiting completion of workup, including second troponin and D-dimer.  Anticipate discharge to home with reassuring workup.                            Medical Decision Making Amount and/or Complexity of  Data Reviewed Labs: ordered. Radiology: ordered.   For this patient's complaint of chest pain, the following emergent conditions were considered on the differential diagnosis: acute coronary syndrome, pulmonary embolism, pneumothorax, myocarditis, pericardial tamponade, aortic dissection, thoracic aortic aneurysm complication, esophageal perforation.   Other causes were also considered including: gastroesophageal reflux disease, musculoskeletal pain including costochondritis, pneumonia/pleurisy, herpes zoster, pericarditis.  In regards to possibility of ACS, patient has atypical features of pain, non-ischemic and unchanged EKG and negative troponin(s). Heart score was calculated to be 3.   In regards to possibility of PE, low risk Wells, no active cancer treatment but history of cancer, awaiting D-dimer.         Final Clinical Impression(s) / ED Diagnoses Final diagnoses:  Precordial chest pain  Exertional dyspnea    Rx / DC Orders ED Discharge Orders     None         Renne Crigler, PA-C 11/18/22 1548    Benjiman Core, MD 11/18/22 1610

## 2023-03-29 NOTE — Progress Notes (Shared)
Subjective:    Victoria Hunt is a 56 y.o. female and is here for a comprehensive physical exam.  HPI  Health Maintenance Due  Topic Date Due   Zoster Vaccines- Shingrix (1 of 2) Never done   Colonoscopy  Never done   INFLUENZA VACCINE  12/22/2022   COVID-19 Vaccine (6 - 2023-24 season) 01/22/2023    Acute Concerns: ***  Chronic Issues: Anxiety   Arthritis   Hypertension  Seen in ED on 11/18/22 for precordial chest pain. Chest X-ray showed no acute cardiopulmonary disease.  Health Maintenance:EKG showed sinus rhythm. Immunizations -- Due for shingles vaccine. UTD on tetanus vaccine. Colonoscopy -- Due for first screening. Mammogram -- Mass detected in left breast on 07/06/21 mammogram. 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 PAP -- Abnormal squamous cell of undetermined significance on 06/16/21. Bone Density -- N/A Diet -- *** Exercise -- ***  Sleep habits -- *** Mood -- ***  UTD with dentist? - *** UTD with eye doctor? - ***  Weight history: Wt Readings from Last 10 Encounters:  11/18/22 (!) 311 lb (141.1 kg)  04/25/22 289 lb (131.1 kg)  03/14/22 281 lb (127.5 kg)  03/10/22 281 lb (127.5 kg)  02/16/22 280 lb (127 kg)  01/20/22 285 lb 9.6 oz (129.5 kg)  12/21/21 294 lb (133.4 kg)  11/15/21 277 lb 12.8 oz (126 kg)  11/02/21 270 lb 12.8 oz (122.8 kg)  10/07/21 272 lb 2 oz (123.4 kg)   There is no height or weight on file to calculate BMI. No LMP recorded (lmp unknown). Patient has had a hysterectomy.  Alcohol use:  reports that she does not currently use alcohol.  Tobacco use:  Tobacco Use: Low Risk  (11/18/2022)   Patient History    Smoking Tobacco Use: Never    Smokeless Tobacco Use: Never    Passive Exposure: Past   Eligible for lung cancer screening? ***     06/18/2021    9:32 AM  Depression screen PHQ 2/9  Decreased Interest 0  Down, Depressed, Hopeless 0  PHQ - 2 Score 0     Other  providers/specialists: Patient Care Team: Jarold Motto, [place] as PCP - General (Physician Assistant) Maisie Fus, MD as PCP - Cardiology (Cardiology)    PMHx, SurgHx, SocialHx, Medications, and Allergies were reviewed in the Visit Navigator and updated as appropriate.   Past Medical History:  Diagnosis Date   Anxiety 04/22/2021   Arthritis    Breast mass, left    Cataract    possible   Endometrial cancer (HCC) 07/21/2021   Endometrial intraepithelial neoplasia (EIN)    History of radiation therapy    Uterus- 08/30/21-10/14/21- Dr. Antony Blackbird   Hypertension 08/25/2020   hx of   Morbidly obese (HCC)    Pre-diabetes      Past Surgical History:  Procedure Laterality Date   ABDOMINAL HYSTERECTOMY     APPENDECTOMY  August 25, 2020   CHOLECYSTECTOMY     LAPAROSCOPIC APPENDECTOMY N/A 08/25/2020   Procedure: APPENDECTOMY LAPAROSCOPIC;  Surgeon: Axel Filler, MD;  [facility] OR;  Service: General;  Laterality: N/A;   RADIOACTIVE SEED GUIDED EXCISIONAL BREAST BIOPSY Left 11/02/2021   Procedure: RADIOACTIVE SEED GUIDED EXCISIONAL LEFT BREAST BIOPSY;  Surgeon: Emelia Loron, MD;  Location:  SURGERY CENTER;  Service: General;  Laterality: Left;   ROBOTIC ASSISTED TOTAL HYSTERECTOMY WITH BILATERAL SALPINGO OOPHERECTOMY Bilateral 07/14/2021   Procedure: XI ROBOTIC ASSISTED TOTAL HYSTERECTOMY WITH BILATERAL SALPINGO OOPHORECTOMY, VAGINAL TEAR  REPAIR;  Surgeon: Carver Fila, MD;  Location: [facility] ORS;  Service: Gynecology;  Laterality: Bilateral;   SENTINEL NODE BIOPSY N/A 07/14/2021   Procedure: SENTINEL NODE BIOPSY;  Surgeon: Carver Fila, MD;  Location: [facility] ORS;  Service: Gynecology;  Laterality: N/A;   WISDOM TOOTH EXTRACTION       Family History  Problem Relation Age of Onset   Depression Father    Heart disease Father    Heart attack Father    Aortic aneurysm Father    Bipolar disorder Father    CVA Maternal Grandmother    CVA Maternal  Grandfather    Depression Paternal Grandmother    Myasthenia gravis Paternal Grandfather    Cancer Paternal Great-grandmother        uterine or cervical   Breast cancer Neg Hx    Colon cancer Neg Hx    Pancreatic cancer Neg Hx    Prostate cancer Neg Hx     Social History   Tobacco Use   Smoking status: Never    Passive exposure: Past   Smokeless tobacco: Never  Vaping Use   Vaping status: Never Used  Substance Use Topics   Alcohol use: Not Currently   Drug use: Never    Review of Systems:   ROS  Objective:   LMP  (LMP Unknown)  There is no height or weight on file to calculate BMI.   General Appearance:    Alert, cooperative, no distress, appears stated age  Head:    Normocephalic, without obvious abnormality, atraumatic  Eyes:    PERRL, conjunctiva/corneas clear, EOM's intact, fundi    benign, both eyes  Ears:    Normal TM's and external ear canals, both ears  Nose:   Nares normal, septum midline, mucosa normal, no drainage    or sinus tenderness  Throat:   Lips, mucosa, and tongue normal; teeth and gums normal  Neck:   Supple, symmetrical, trachea midline, no adenopathy;    thyroid:  no enlargement/tenderness/nodules; no carotid   bruit or JVD  Back:     Symmetric, no curvature, ROM normal, no CVA tenderness  Lungs:     Clear to auscultation bilaterally, respirations unlabored  Chest Wall:    No tenderness or deformity   Heart:    Regular rate and rhythm, S1 and S2 normal, no murmur, rub or gallop  Breast Exam:    ***No tenderness, masses, or nipple abnormality  Abdomen:     Soft, non-tender, bowel sounds active all four quadrants,    no masses, no organomegaly  Genitalia:    ***Normal female without lesion, discharge or tenderness  Extremities:   Extremities normal, atraumatic, no cyanosis or edema  Pulses:   2+ and symmetric all extremities  Skin:   Skin color, texture, turgor normal, no rashes or lesions  Lymph nodes:   Cervical, supraclavicular, and  axillary nodes normal  Neurologic:   CNII-XII intact, normal strength, sensation and reflexes    throughout    Assessment/Plan:   ***   I,Alexander Ruley,acting as a scribe for [employer], PA.,have documented all relevant documentation on the behalf of Jarold Motto, PA,as directed by  Jarold Motto, PA while in the presence of Jarold Motto, [place].   ***   Jarold Motto, PA-C [place] [facility] [place]

## 2023-03-30 DIAGNOSIS — H2521 Age-related cataract, morgagnian type, right eye: Secondary | ICD-10-CM | POA: Diagnosis not present

## 2023-03-30 DIAGNOSIS — H2512 Age-related nuclear cataract, left eye: Secondary | ICD-10-CM | POA: Diagnosis not present

## 2023-04-12 ENCOUNTER — Encounter: Payer: BC Managed Care – PPO | Admitting: Physician Assistant

## 2023-07-20 DIAGNOSIS — Z0184 Encounter for antibody response examination: Secondary | ICD-10-CM | POA: Diagnosis not present

## 2023-08-21 ENCOUNTER — Encounter: Payer: BC Managed Care – PPO | Admitting: Physician Assistant

## 2023-09-01 ENCOUNTER — Encounter (INDEPENDENT_AMBULATORY_CARE_PROVIDER_SITE_OTHER): Payer: Self-pay

## 2023-11-06 ENCOUNTER — Encounter: Payer: BC Managed Care – PPO | Admitting: Physician Assistant

## 2024-01-31 ENCOUNTER — Emergency Department (HOSPITAL_COMMUNITY)

## 2024-01-31 ENCOUNTER — Emergency Department (HOSPITAL_COMMUNITY)
Admission: EM | Admit: 2024-01-31 | Discharge: 2024-01-31 | Disposition: A | Discharge status: Home / Self Care | Attending: Emergency Medicine | Admitting: Emergency Medicine

## 2024-01-31 DIAGNOSIS — Z8542 Personal history of malignant neoplasm of other parts of uterus: Secondary | ICD-10-CM | POA: Insufficient documentation

## 2024-01-31 DIAGNOSIS — R110 Nausea: Secondary | ICD-10-CM | POA: Diagnosis not present

## 2024-01-31 DIAGNOSIS — R079 Chest pain, unspecified: Secondary | ICD-10-CM | POA: Diagnosis not present

## 2024-01-31 DIAGNOSIS — R0602 Shortness of breath: Secondary | ICD-10-CM | POA: Diagnosis not present

## 2024-01-31 DIAGNOSIS — M549 Dorsalgia, unspecified: Secondary | ICD-10-CM | POA: Diagnosis not present

## 2024-01-31 DIAGNOSIS — I10 Essential (primary) hypertension: Secondary | ICD-10-CM | POA: Diagnosis not present

## 2024-01-31 DIAGNOSIS — R0789 Other chest pain: Secondary | ICD-10-CM | POA: Diagnosis not present

## 2024-01-31 LAB — TROPONIN I (HIGH SENSITIVITY)
Troponin I (High Sensitivity): 4 ng/L (ref <18)
Troponin I (High Sensitivity): 3 ng/L (ref <18)

## 2024-01-31 LAB — BRAIN NATRIURETIC PEPTIDE: B Natriuretic Peptide: 34.3 pg/mL (ref 0.0–100.0)

## 2024-01-31 LAB — CBC
HCT: 41.2 % (ref 36.0–46.0)
Hemoglobin: 13.5 g/dL (ref 12.0–15.0)
MCH: 28.6 pg (ref 26.0–34.0)
MCHC: 32.8 g/dL (ref 30.0–36.0)
MCV: 87.3 fL (ref 80.0–100.0)
Platelets: 334 K/uL (ref 150–400)
RBC: 4.72 MIL/uL (ref 3.87–5.11)
RDW: 14 % (ref 11.5–15.5)
WBC: 6.6 K/uL (ref 4.0–10.5)
nRBC: 0 % (ref 0.0–0.2)

## 2024-01-31 LAB — BASIC METABOLIC PANEL WITH GFR
Anion gap: 13 (ref 5–15)
BUN: 19 mg/dL (ref 6–20)
CO2: 22 mmol/L (ref 22–32)
Calcium: 9 mg/dL (ref 8.9–10.3)
Chloride: 104 mmol/L (ref 98–111)
Creatinine, Ser: 0.9 mg/dL (ref 0.44–1.00)
GFR, Estimated: >60 mL/min (ref >60)
Glucose, Bld: 128 mg/dL — ABNORMAL HIGH (ref 70–99)
Potassium: 3.9 mmol/L (ref 3.5–5.1)
Sodium: 139 mmol/L (ref 135–145)

## 2024-01-31 MED ORDER — IOHEXOL 350 MG/ML SOLN
75.0000 mL | Freq: Once | INTRAVENOUS | Status: AC | PRN
  Administered 2024-01-31: 75 mL via INTRAVENOUS

## 2024-01-31 MED ORDER — SODIUM CHLORIDE 0.9 % IV BOLUS
500.0000 mL | Freq: Once | INTRAVENOUS | Status: AC
  Administered 2024-01-31: 500 mL via INTRAVENOUS

## 2024-01-31 NOTE — ED Triage Notes (Signed)
 Pt bib POV c/o sob with exertion and chest tightness that feel like indigestion. Pt took 81 mg aspirin . Pt symptoms started 24 hours ago.   Pt endorses back pain and nausea last night.  Hx HTN

## 2024-01-31 NOTE — ED Notes (Signed)
 Pt. States that she feels great; pt. States that she is ready to go home

## 2024-01-31 NOTE — ED Provider Notes (Signed)
 [place] EMERGENCY DEPARTMENT AT [facility] Provider Note   CSN: [id number] Arrival date & time: 01/31/24  0441     Patient presents with: Shortness of Breath and Chest Pain   Victoria Hunt is a 58 y.o. female.   Pt is a 58 yo female with pmhx significant for obesity, htn, endometrial cancer s/p hyst and arthritis.  Pt woke up this am around 0300 with severe sob.  She waited for several hours to be seen and sob has improved.  Pt denies any cp.  No leg swelling.  No fevers/cough.       Prior to Admission medications   Medication Sig Start Date End Date Taking? Authorizing Provider  acetaminophen  (TYLENOL ) 325 MG tablet Take 325 mg by mouth as needed.    [provider]  ibuprofen  (ADVIL ) 100 MG tablet Take 100 mg by mouth every 6 (six) hours as needed for fever.    [provider]    Allergies: Patient has no known allergies.    Review of Systems  Respiratory:  Positive for shortness of breath.   All other systems reviewed and are negative.   Updated Vital Signs BP (!) 162/80 (BP Location: Right Arm)   Pulse 82   Temp 97.7 F (36.5 C) (Oral)   Resp 12   Ht 5' 3 (1.6 m)   Wt (!) 146.1 kg   LMP  (LMP Unknown)   SpO2 100%   BMI 57.04 kg/m   Physical Exam Vitals and nursing note reviewed.  Constitutional:      Appearance: She is well-developed.  HENT:     Head: Normocephalic and atraumatic.     Mouth/Throat:     Mouth: Mucous membranes are moist.     Pharynx: Oropharynx is clear.  Eyes:     Extraocular Movements: Extraocular movements intact.     Pupils: Pupils are equal, round, and reactive to light.  Cardiovascular:     Rate and Rhythm: Normal rate and regular rhythm.  Pulmonary:     Effort: Pulmonary effort is normal.     Breath sounds: Normal breath sounds.  Abdominal:     General: Bowel sounds are normal.     Palpations: Abdomen is soft.  Musculoskeletal:        General: Normal range of motion.     Cervical back:  Normal range of motion and neck supple.  Skin:    General: Skin is warm.     Capillary Refill: Capillary refill takes less than 2 seconds.  Neurological:     General: No focal deficit present.     Mental Status: She is alert and oriented to person, place, and time.  Psychiatric:        Mood and Affect: Mood normal.        Behavior: Behavior normal.     (all labs ordered are listed, but only abnormal results are displayed) Labs Reviewed  BASIC METABOLIC PANEL WITH GFR - Abnormal; Notable for the following components:      Result Value   Glucose, Bld 128 (*)    All other components within normal limits  CBC  BRAIN NATRIURETIC PEPTIDE  TROPONIN I (HIGH SENSITIVITY)  TROPONIN I (HIGH SENSITIVITY)    EKG: EKG Interpretation Date/Time:  Wednesday January 31 2024 04:52:02 EDT Ventricular Rate:  90 PR Interval:  140 QRS Duration:  80 QT Interval:  362 QTC Calculation: 442 R Axis:   110  Text Interpretation: Normal sinus rhythm Left posterior fascicular block Cannot  rule out Anterior infarct , age undetermined Abnormal ECG Low voltage QRS When compared with ECG of 18-Nov-2022 16:34, No significant change was found Confirmed by Raford Lenis ([id number]) on 01/31/2024 5:37:23 AM  Radiology: DG Chest 2 View Result Date: 01/31/2024 EXAM: 2 VIEW(S) XRAY OF THE CHEST 01/31/2024 05:02:00 AM COMPARISON: 11/18/2022 CLINICAL HISTORY: SOB. Pt bib POV c/o sob with exertion and chest tightness that feel like indigestion. Pt took 81 mg aspirin . Pt symptoms started 24 hours ago. Pt endorses back pain and nausea last night. Hx HTN. FINDINGS: LUNGS AND PLEURA: No focal pulmonary opacity. No pulmonary edema. No pleural effusion. No pneumothorax. HEART AND MEDIASTINUM: No acute abnormality of the cardiac and mediastinal silhouettes. BONES AND SOFT TISSUES: No acute osseous abnormality. Thoracic degenerative changes. IMPRESSION: 1. No acute process. Electronically signed by: Waddell Calk MD 01/31/2024 05:16 AM  EDT RP Workstation: [id number]     Procedures   Medications Ordered in the ED  sodium chloride  0.9 % bolus 500 mL (has no administration in time range)                                    Medical Decision Making Amount and/or Complexity of Data Reviewed Labs: ordered. Radiology: ordered.  Risk Prescription drug management.   This patient presents to the ED for concern of sob, this involves an extensive number of treatment options, and is a complaint that carries with it a high risk of complications and morbidity.  The differential diagnosis includes cardiac event, PE, pna, bronchitis, anemia   Co morbidities that complicate the patient evaluation  obesity, htn, endometrial cancer s/p hyst and arthritis   Additional history obtained:  Additional history obtained from epic chart review  Lab Tests:  I Ordered, and personally interpreted labs.  The pertinent results include:  cbc nl, bmp nl, trop nl. Bnp nl   Imaging Studies ordered:  I ordered imaging studies including cxr and cta chest  I independently visualized and interpreted imaging which showed  CXR: No acute process. CT chest:   No acute intrathoracic pathology. No CT evidence of pulmonary artery  embolus.   I agree with the radiologist interpretation   Cardiac Monitoring:  The patient was maintained on a cardiac monitor.  I personally viewed and interpreted the cardiac monitored which showed an underlying rhythm of: st   Medicines ordered and prescription drug management:  I ordered medication including ivfs  for sx  Reevaluation of the patient after these medicines showed that the patient improved I have reviewed the patients home medicines and have made adjustments as needed   Test Considered:  ct   Problem List / ED Course:  SOB: etiology unclear.  Cardiac work up neg.  CT chest neg.  Pt is stable for d/c.  Return if worse.  F/u with pcp.   Reevaluation:  After the interventions  noted above, I reevaluated the patient and found that they have :improved   Social Determinants of Health:  Lives at home   Dispostion:  After consideration of the diagnostic results and the patients response to treatment, I feel that the patent would benefit from discharge with outpatient f/u.       Final diagnoses:  None    ED Discharge Orders     None          Dean Clarity, MD 01/31/24 1739

## 2024-01-31 NOTE — ED Notes (Signed)
 Patient transported to CT

## 2024-02-05 ENCOUNTER — Telehealth: Payer: Self-pay

## 2024-02-05 NOTE — Telephone Encounter (Signed)
 Transition Care Management Unsuccessful Follow-up Telephone Call  Date of discharge and from where:  01/31/24 [facility]  Attempts:  1st Attempt  Reason for unsuccessful TCM follow-up call:  Left voice message for patient to complete [facility] call regarding recent ED visit; advised to return call to schedule ED follow up with PCP. Please schedule ED follow up for patient per discharge.

## 2024-03-11 ENCOUNTER — Inpatient Hospital Stay: Admitting: Physician Assistant

## 2024-04-03 ENCOUNTER — Inpatient Hospital Stay: Admitting: Physician Assistant

## 2024-05-12 ENCOUNTER — Encounter: Payer: Self-pay | Admitting: Physician Assistant

## 2024-05-13 ENCOUNTER — Encounter: Payer: Self-pay | Admitting: Nurse Practitioner

## 2024-05-13 ENCOUNTER — Ambulatory Visit: Admitting: Physician Assistant

## 2024-05-13 ENCOUNTER — Encounter: Payer: Self-pay | Admitting: Physician Assistant

## 2024-05-13 ENCOUNTER — Other Ambulatory Visit: Payer: Self-pay | Admitting: Nurse Practitioner

## 2024-05-13 VITALS — BP 152/82 | HR 83 | Temp 98.1°F | Wt 302.2 lb

## 2024-05-13 DIAGNOSIS — K760 Fatty (change of) liver, not elsewhere classified: Secondary | ICD-10-CM

## 2024-05-13 DIAGNOSIS — R5383 Other fatigue: Secondary | ICD-10-CM

## 2024-05-13 DIAGNOSIS — K7402 Hepatic fibrosis, advanced fibrosis: Secondary | ICD-10-CM

## 2024-05-13 DIAGNOSIS — E669 Obesity, unspecified: Secondary | ICD-10-CM | POA: Diagnosis not present

## 2024-05-13 DIAGNOSIS — R030 Elevated blood-pressure reading, without diagnosis of hypertension: Secondary | ICD-10-CM | POA: Diagnosis not present

## 2024-05-13 DIAGNOSIS — R0609 Other forms of dyspnea: Secondary | ICD-10-CM

## 2024-05-13 DIAGNOSIS — Z1231 Encounter for screening mammogram for malignant neoplasm of breast: Secondary | ICD-10-CM

## 2024-05-13 DIAGNOSIS — E66813 Obesity, class 3: Secondary | ICD-10-CM

## 2024-05-13 DIAGNOSIS — E559 Vitamin D deficiency, unspecified: Secondary | ICD-10-CM

## 2024-05-13 DIAGNOSIS — Z1211 Encounter for screening for malignant neoplasm of colon: Secondary | ICD-10-CM

## 2024-05-13 NOTE — Patient Instructions (Addendum)
" °  VISIT SUMMARY: Today, we discussed your ongoing health issues, including liver disease, fatigue, hypertension, obesity, cataract, and vitamin D deficiency. We also reviewed your general health maintenance needs.  YOUR PLAN: FATTY LIVER DISEASE: Chronic fatty liver disease with inflammation. -We will coordinate with your liver specialist for further management.  FATIGUE AND DYSPNEA ON EXERTION: Chronic fatigue and shortness of breath with palpitations. -You are referred to cardiologist Dr. Dawna Bruckner for further evaluation. -We have ordered tests for A1c, vitamin D, and B12 levels to check for other causes of fatigue.  HYPERTENSION: Chronic high blood pressure with home readings in the 140s/70s-80s. -Continue monitoring your blood pressure at home. -Keep up with your weight loss and dietary changes.  OBESITY: Recent 22-pound weight loss with a goal to lose an additional 20 pounds for cataract surgery eligibility. -Continue participating in the Weight Watchers program. -Keep working on your weight loss to meet the criteria for surgery.  CATARACT: Chronic cataract with previous surgery canceled due to high BMI. -Attend your scheduled appointment with [facility] in January for cataract evaluation and potential surgery.  VITAMIN D DEFICIENCY: Low vitamin D levels. -We have ordered a vitamin D level test to assess your current status.  GENERAL HEALTH MAINTENANCE: Overdue for colonoscopy and mammogram. Needs updated cancer screenings. -We have ordered a mammogram through [facility]. -We have ordered routine blood work including CBC, liver function panel, PT, and BMP.  For your blood work: please go to [address] located in the [facility]. It is across the street from [facility]. Lab (and xray) are located in the basement.  Hours of operation are M-F 8:30am to 5:00pm. (Please note that they are closed for lunch between  12:30 and 1:00pm.)                       Contains text generated by Abridge.                                 Contains text generated by Abridge.   "

## 2024-05-13 NOTE — Progress Notes (Signed)
 "  History of Present Illness:   Chief Complaint  Patient presents with   Hospitalization Follow-up    Was seen for chest Tightness and SOB. EKG and blood work were done at visit in September. Has been dealing with this is 2022. Symptoms come and go. Has been seen my cardiology. Has not had a PE since 2023.     Discussed the use of AI scribe software for clinical note transcription with the patient, who gave verbal consent to proceed.  History of Present Illness   Victoria Hunt is a 58 year old female who presents for follow-up on multiple ongoing health issues.  In November 2023 she saw rheumatology at [facility] for elevated autoimmune markers but did not receive a clear diagnosis or treatment plan and is frustrated about ongoing symptoms after prior cancer treatment and lumpectomy.  She follows annually with a liver specialist after MRI showed inflammation in the top lobe of her liver. No biopsy has been done. A sonogram is pending to reassess for changes.  She had major financial strain in 2023 from being underinsured, with large medical bills. This has made her reluctant to seek care despite recurrent joint inflammation, palpitations, and shortness of breath that began in 2022 and have led to ER visits.  She has chronic fatigue and soreness. She had one episode in February of hives and lip swelling that improved with Benadryl . She has avoided dried mango since and has not had recurrent rashes.  She has a cataract that was not operated on last year because her BMI was too high. She has since lost 22 pounds through Weight Watchers and diet changes and is continuing weight loss efforts to qualify for surgery.  She checks home blood pressures daily, usually in the 140s/70s-80s. She has never used antihypertensive medication.  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 She has intermittent palpitations  and shortness of breath that can persist for days and have prompted ER visits. Prior cardiac workup including a heart monitor has been unrevealing.  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 She has had COVID-19 three times and feels her breathing has been weaker since, with prolonged cough after colds.        Past Medical History:  Diagnosis Date   Anxiety 04/22/2021   Arthritis    Breast mass, left    Cataract    possible   Endometrial cancer (HCC) 07/21/2021   Endometrial intraepithelial neoplasia (EIN)    Fatty liver    History of radiation therapy    Uterus- 08/30/21-10/14/21- Dr. Lynwood Nasuti   Hypertension 08/25/2020   hx of   Morbidly obese (HCC)    Pre-diabetes      Social History[1]  Past Surgical History:  Procedure Laterality Date   ABDOMINAL HYSTERECTOMY     APPENDECTOMY  August 25, 2020   BREAST SURGERY     CHOLECYSTECTOMY     LAPAROSCOPIC APPENDECTOMY N/A 08/25/2020   Procedure: APPENDECTOMY LAPAROSCOPIC;  Surgeon: Rubin Calamity, MD;  Location: [facility] OR;  Service: General;  Laterality: N/A;   RADIOACTIVE SEED GUIDED EXCISIONAL BREAST BIOPSY Left 11/02/2021   Procedure: RADIOACTIVE SEED GUIDED EXCISIONAL LEFT BREAST BIOPSY;  Surgeon: Ebbie Cough, MD;  Location: [facility];  Service: General;  Laterality: Left;   ROBOTIC ASSISTED TOTAL HYSTERECTOMY WITH BILATERAL SALPINGO OOPHERECTOMY Bilateral 07/14/2021   Procedure:  XI ROBOTIC ASSISTED TOTAL HYSTERECTOMY WITH BILATERAL SALPINGO OOPHORECTOMY, VAGINAL TEAR REPAIR;  Surgeon: Viktoria Comer SAUNDERS, MD;  Location: [facility] ORS;  Service: Gynecology;  Laterality: Bilateral;   SENTINEL NODE BIOPSY N/A 07/14/2021   Procedure: SENTINEL NODE BIOPSY;  Surgeon: Viktoria Comer SAUNDERS, MD;  Location: [facility] OR[facility];  Service: Gynecology;  Laterality: N/A;   WISDOM TOOTH EXTRACTION      Family History  Problem Relation Age of Onset    Depression Father    Heart disease Father    Heart attack Father    Aortic aneurysm Father    Bipolar disorder Father    CVA Maternal Grandmother    CVA Maternal Grandfather    Depression Paternal Grandmother    Myasthenia gravis Paternal Grandfather    Cancer Paternal Great-grandmother        uterine or cervical   Breast cancer Neg Hx    Colon cancer Neg Hx    Pancreatic cancer Neg Hx    Prostate cancer Neg Hx     Allergies[2]  Current Medications:  Current Medications[3]   Review of Systems:   Negative unless otherwise specified per HPI.  Vitals:   Vitals:   05/13/24 1304  BP: (!) 152/82  Pulse: 83  Temp: 98.1 F (36.7 C)  TempSrc: Temporal  SpO2: 98%  Weight: (!) 302 lb 3.2 oz (137.1 kg)     Body mass index is 53.53 kg/m.  Physical Exam:   Physical Exam  Assessment and Plan:   Assessment and Plan    Fatty liver disease Chronic fatty liver disease with inflammation. Awaiting sonogram for changes or stiffness. Potential Wegovy use if criteria met and covered by insurance. - Her liver specialist has ordered sonogram to assess liver changes. - Coordinated with liver specialist for further management.  Fatigue and dyspnea on exertion Chronic fatigue and dyspnea with palpitations. Previous ER visits ruled out serious conditions. Symptoms may relate to post-COVID or reactive airway disease. - Referred to cardiologist Dr. Dawna Bruckner for further evaluation. - Ordered A1c, vitamin D, and B12 levels to assess for other causes of fatigue. Encouraged her to get UpToDate on cancer screenings (overdue on mammogram and colonoscopy)  Elevated blood pressure reading Chronic hypertension with home readings in 140s/70s-80s. Prefers lifestyle management over medication. - Continue monitoring blood pressure at home. - Encouraged continued weight loss and dietary modifications.  Obesity Recent 22-pound weight loss. Motivated to lose additional 20 pounds for  cataract surgery eligibility. - Continue participation in Weight Watchers program. - Encouraged further weight loss to meet surgical criteria.  Vitamin D deficiency Vitamin D deficiency. - Ordered vitamin D level to assess current status.  General Health Maintenance Overdue for colonoscopy and mammogram. Previous mammogram provider no longer covered by insurance. Needs updated cancer screenings. - Ordered mammogram through [facility]. - Scheduled colonoscopy at [facility]. - Ordered CBC, liver function panel, PT, and BMP for routine blood work.         Lucie Buttner, PA-C    [1]  Social History Tobacco Use   Smoking status: Never    Passive exposure: Past   Smokeless tobacco: Never  Vaping Use   Vaping status: Never Used  Substance Use Topics   Alcohol use: Not Currently   Drug use: Never  [2] No Known Allergies [3]  Current Outpatient Medications:    acetaminophen  (TYLENOL ) 325 MG tablet, Take 325 mg by mouth as needed., Disp: , Rfl:    ibuprofen  (ADVIL ) 100 MG tablet, Take 100 mg by  mouth every 6 (six) hours as needed for fever., Disp: , Rfl:   "

## 2024-05-21 ENCOUNTER — Other Ambulatory Visit (INDEPENDENT_AMBULATORY_CARE_PROVIDER_SITE_OTHER)

## 2024-05-21 DIAGNOSIS — K760 Fatty (change of) liver, not elsewhere classified: Secondary | ICD-10-CM

## 2024-05-21 DIAGNOSIS — E559 Vitamin D deficiency, unspecified: Secondary | ICD-10-CM

## 2024-05-21 DIAGNOSIS — R5383 Other fatigue: Secondary | ICD-10-CM | POA: Diagnosis not present

## 2024-05-21 LAB — LIPID PANEL
Cholesterol: 190 mg/dL (ref 28–200)
HDL: 40.5 mg/dL
LDL Cholesterol: 86 mg/dL (ref 10–99)
NonHDL: 149.13
Total CHOL/HDL Ratio: 5
Triglycerides: 314 mg/dL — ABNORMAL HIGH (ref 10.0–149.0)
VLDL: 62.8 mg/dL — ABNORMAL HIGH (ref 0.0–40.0)

## 2024-05-21 LAB — CBC WITH DIFFERENTIAL/PLATELET
Basophils Absolute: 0.1 K/uL (ref 0.0–0.1)
Basophils Relative: 0.8 % (ref 0.0–3.0)
Eosinophils Absolute: 0.2 K/uL (ref 0.0–0.7)
Eosinophils Relative: 3 % (ref 0.0–5.0)
HCT: 40.2 % (ref 36.0–46.0)
Hemoglobin: 13.7 g/dL (ref 12.0–15.0)
Lymphocytes Relative: 17.1 % (ref 12.0–46.0)
Lymphs Abs: 1.1 K/uL (ref 0.7–4.0)
MCHC: 34.1 g/dL (ref 30.0–36.0)
MCV: 84.2 fl (ref 78.0–100.0)
Monocytes Absolute: 0.4 K/uL (ref 0.1–1.0)
Monocytes Relative: 6.7 % (ref 3.0–12.0)
Neutro Abs: 4.7 K/uL (ref 1.4–7.7)
Neutrophils Relative %: 72.4 % (ref 43.0–77.0)
Platelets: 293 K/uL (ref 150.0–400.0)
RBC: 4.78 Mil/uL (ref 3.87–5.11)
RDW: 14.2 % (ref 11.5–15.5)
WBC: 6.4 K/uL (ref 4.0–10.5)

## 2024-05-21 LAB — HEPATIC FUNCTION PANEL
ALT: 15 U/L (ref 3–35)
AST: 14 U/L (ref 5–37)
Albumin: 4.2 g/dL (ref 3.5–5.2)
Alkaline Phosphatase: 90 U/L (ref 39–117)
Bilirubin, Direct: 0.1 mg/dL (ref 0.1–0.3)
Total Bilirubin: 0.4 mg/dL (ref 0.2–1.2)
Total Protein: 7.1 g/dL (ref 6.0–8.3)

## 2024-05-21 LAB — VITAMIN D 25 HYDROXY (VIT D DEFICIENCY, FRACTURES): VITD: 9.44 ng/mL — ABNORMAL LOW (ref 30.00–100.00)

## 2024-05-21 LAB — BASIC METABOLIC PANEL WITH GFR
BUN: 12 mg/dL (ref 6–23)
CO2: 30 meq/L (ref 19–32)
Calcium: 8.9 mg/dL (ref 8.4–10.5)
Chloride: 101 meq/L (ref 96–112)
Creatinine, Ser: 0.77 mg/dL (ref 0.40–1.20)
GFR: 86.07 mL/min
Glucose, Bld: 99 mg/dL (ref 70–99)
Potassium: 4.5 meq/L (ref 3.5–5.1)
Sodium: 139 meq/L (ref 135–145)

## 2024-05-21 LAB — PROTIME-INR
INR: 1.1 ratio — ABNORMAL HIGH (ref 0.8–1.0)
Prothrombin Time: 11.5 s (ref 9.6–13.1)

## 2024-05-21 LAB — HEMOGLOBIN A1C: Hgb A1c MFr Bld: 5.9 % (ref 4.6–6.5)

## 2024-05-21 LAB — VITAMIN B12: Vitamin B-12: 162 pg/mL — ABNORMAL LOW (ref 211–911)

## 2024-05-24 ENCOUNTER — Ambulatory Visit: Payer: Self-pay | Admitting: Physician Assistant

## 2024-05-24 MED ORDER — VITAMIN D (ERGOCALCIFEROL) 1.25 MG (50000 UNIT) PO CAPS: 50000.0000 [IU] | ORAL_CAPSULE | ORAL | 0 refills | Status: AC

## 2024-05-27 ENCOUNTER — Ambulatory Visit

## 2024-05-29 ENCOUNTER — Ambulatory Visit
Admission: RE | Admit: 2024-05-29 | Discharge: 2024-05-29 | Disposition: A | Discharge status: Home / Self Care | Source: Ambulatory Visit | Attending: Physician Assistant | Admitting: Physician Assistant

## 2024-05-29 DIAGNOSIS — Z1231 Encounter for screening mammogram for malignant neoplasm of breast: Secondary | ICD-10-CM

## 2024-05-30 ENCOUNTER — Other Ambulatory Visit

## 2024-05-30 ENCOUNTER — Ambulatory Visit
Admission: RE | Admit: 2024-05-30 | Discharge: 2024-05-30 | Disposition: A | Discharge status: Home / Self Care | Source: Ambulatory Visit | Attending: Nurse Practitioner | Admitting: Nurse Practitioner

## 2024-05-30 DIAGNOSIS — K760 Fatty (change of) liver, not elsewhere classified: Secondary | ICD-10-CM

## 2024-05-30 DIAGNOSIS — E66813 Obesity, class 3: Secondary | ICD-10-CM

## 2024-05-30 DIAGNOSIS — K7402 Hepatic fibrosis, advanced fibrosis: Secondary | ICD-10-CM

## 2024-06-12 ENCOUNTER — Ambulatory Visit: Admitting: Physician Assistant

## 2024-06-12 ENCOUNTER — Ambulatory Visit (INDEPENDENT_AMBULATORY_CARE_PROVIDER_SITE_OTHER)

## 2024-06-12 DIAGNOSIS — E538 Deficiency of other specified B group vitamins: Secondary | ICD-10-CM | POA: Diagnosis not present

## 2024-06-12 MED ORDER — CYANOCOBALAMIN 1000 MCG/ML IJ SOLN
1000.0000 ug | Freq: Once | INTRAMUSCULAR | Status: AC
  Administered 2024-06-12: 1000 ug via INTRAMUSCULAR

## 2024-06-12 MED ORDER — CYANOCOBALAMIN 1000 MCG/ML IJ SOLN: 1000.0000 ug | Freq: Once | INTRAMUSCULAR | 12 refills | Status: AC

## 2024-06-12 MED ORDER — SYRINGE (DISPOSABLE) 3 ML MISC: 1.0000 | 0 refills | Status: AC

## 2024-06-12 MED ORDER — "BD DISP NEEDLE 25G X 1"" MISC": 1.0000 | 13 refills | Status: AC

## 2024-06-12 NOTE — Addendum Note (Signed)
 Addended by: Linville Decarolis on: 06/12/2024 02:47 PM   Modules accepted: Orders

## 2024-06-12 NOTE — Progress Notes (Signed)
 Patient is in office today for a nurse visit for B12 Injection. Patient Injection was given in the  Left deltoid. Patient tolerated injection well.

## 2024-06-20 ENCOUNTER — Ambulatory Visit: Admitting: Gastroenterology

## 2024-06-25 ENCOUNTER — Other Ambulatory Visit: Payer: Self-pay | Admitting: *Deleted

## 2024-06-25 MED ORDER — CYANOCOBALAMIN 1000 MCG/ML IJ SOLN: INTRAMUSCULAR | 1 refills | Status: AC

## 2024-06-26 ENCOUNTER — Ambulatory Visit (HOSPITAL_BASED_OUTPATIENT_CLINIC_OR_DEPARTMENT_OTHER): Admitting: Cardiology

## 2024-07-09 ENCOUNTER — Ambulatory Visit (HOSPITAL_BASED_OUTPATIENT_CLINIC_OR_DEPARTMENT_OTHER): Admitting: Cardiology

## 2024-07-11 ENCOUNTER — Ambulatory Visit: Admitting: Gastroenterology

## 2024-08-19 ENCOUNTER — Encounter: Admitting: Physician Assistant
